# Patient Record
Sex: Male | Born: 1955 | Race: White | Hispanic: No
Health system: Southern US, Community
[De-identification: ages and names within clinical notes are randomized; demographics above are authoritative.]

## PROBLEM LIST (undated history)

## (undated) DIAGNOSIS — M109 Gout, unspecified: Secondary | ICD-10-CM

## (undated) DIAGNOSIS — N184 Chronic kidney disease, stage 4 (severe): Secondary | ICD-10-CM

## (undated) DIAGNOSIS — I10 Essential (primary) hypertension: Secondary | ICD-10-CM

## (undated) HISTORY — PX: CHOLECYSTECTOMY: SHX55

---

## 2004-04-30 ENCOUNTER — Emergency Department: Payer: Self-pay | Admitting: Emergency Medicine

## 2005-03-17 ENCOUNTER — Emergency Department: Payer: Self-pay | Admitting: Internal Medicine

## 2005-05-11 ENCOUNTER — Ambulatory Visit: Payer: Self-pay | Admitting: Nurse Practitioner

## 2005-08-10 ENCOUNTER — Ambulatory Visit: Payer: Self-pay | Admitting: Nurse Practitioner

## 2007-05-09 IMAGING — CR DG LUMBAR SPINE AP/LAT/OBLIQUES W/ FLEX AND EXT
1 series · 5 of 5 positions shown · non-contrast
Comparison: none

REASON FOR EXAM: Lumbar spine back pain
COMMENTS:

PROCEDURE:     DXR - DXR LUMBAR SPINE WITH OBLIQUES  - August 10, 2005  [DATE]
RESULT:          There is no evidence of a fracture, dislocation or
malalignment.  Findings consistent with early or mild degenerative disc
disease is appreciated within the lower lumbar spine.

[Series 1: view not recorded · 0.17mm/px · 5 of 5 slices shown]
[im 1/5]
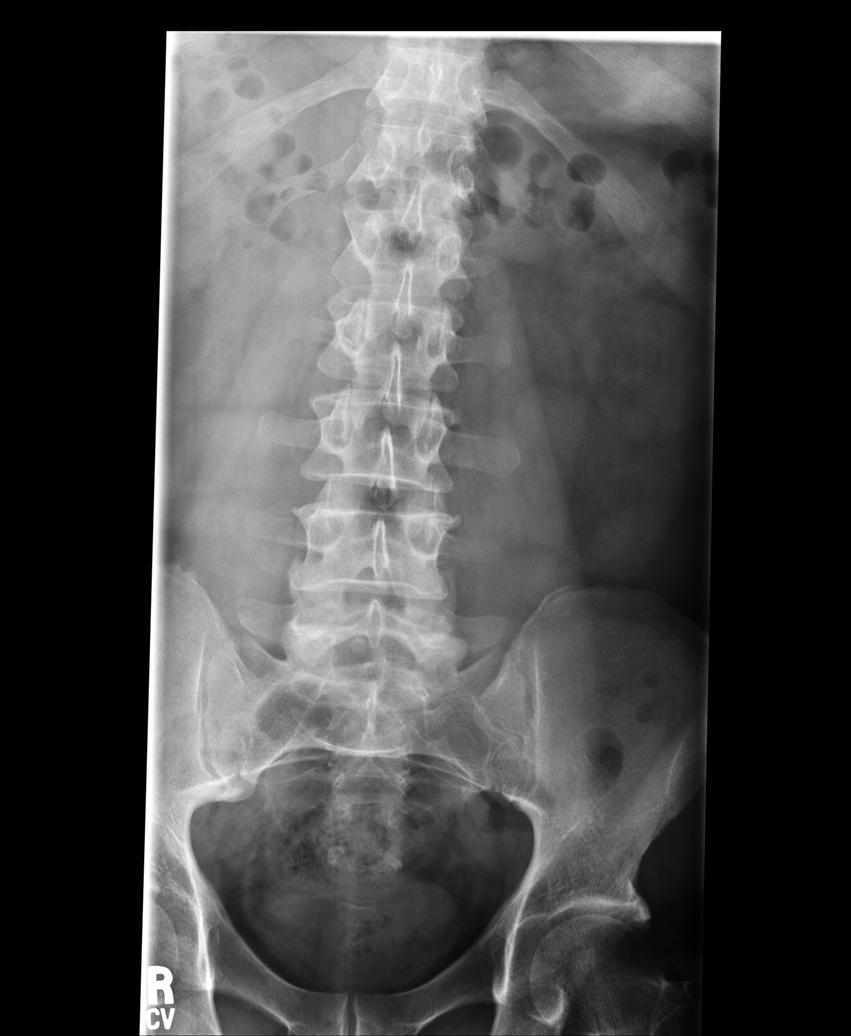
[im 2/5]
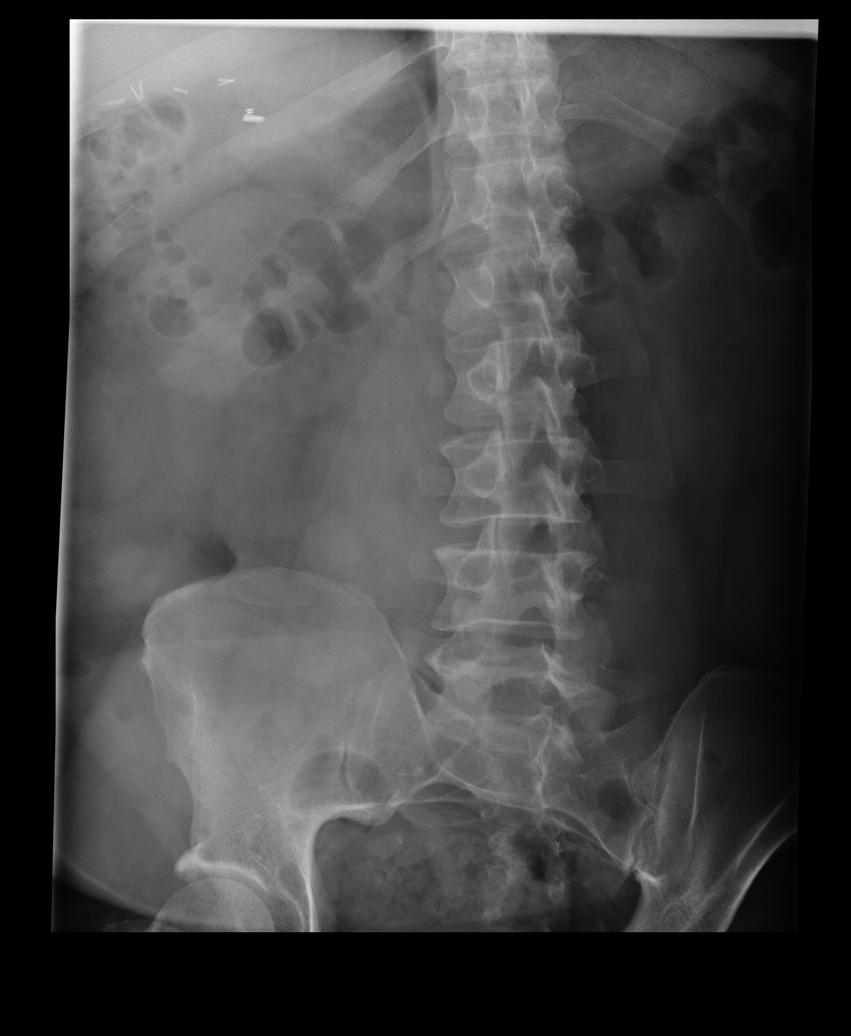
[im 3/5]
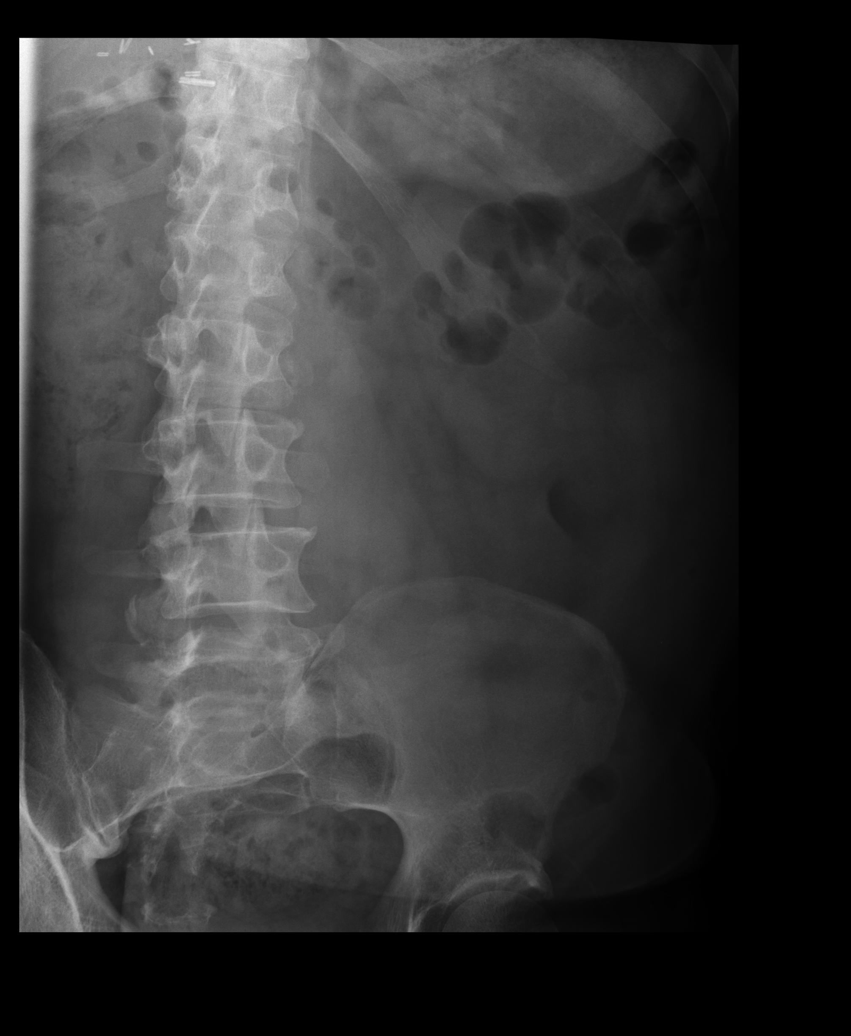
[im 4/5]
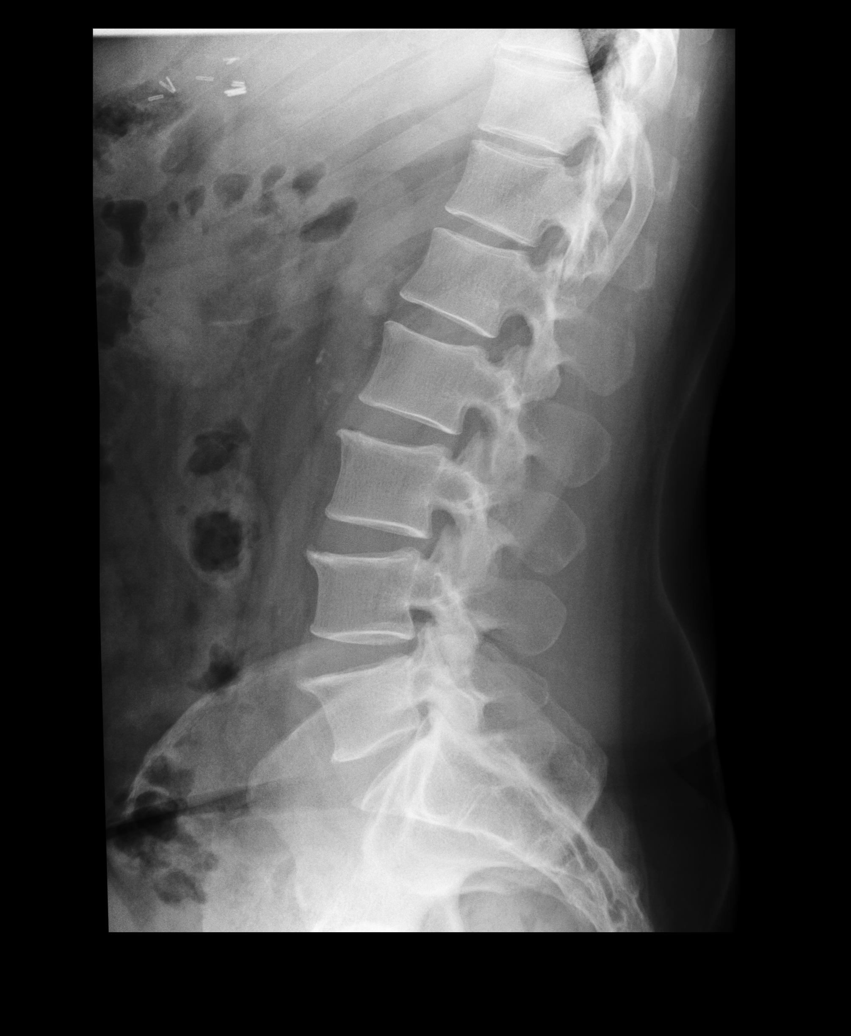
[im 5/5]
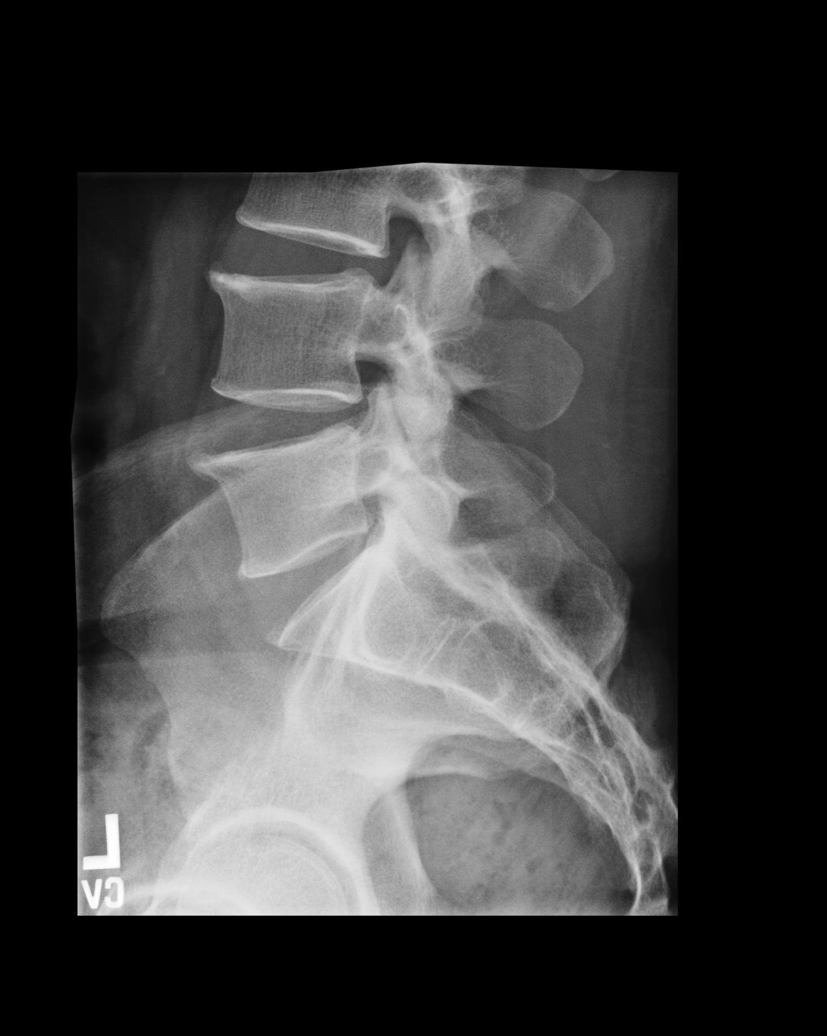

[5 of 5 positions shown; findings below may reference images not displayed]

IMPRESSION: 1.     No evidence of acute osseous abnormalities.
2.     Findings consistent with early or mild degenerative disc disease in
the lower lumbar spine. If there are persistent complaints of pain or
persistent clinical concern, further evaluation with lumbar MRI recommended
if clinically warranted.

## 2007-10-29 ENCOUNTER — Emergency Department: Payer: Self-pay | Admitting: Emergency Medicine

## 2008-08-09 ENCOUNTER — Ambulatory Visit: Payer: Self-pay | Admitting: Gastroenterology

## 2008-08-17 ENCOUNTER — Ambulatory Visit: Payer: Self-pay | Admitting: Nephrology

## 2009-04-15 ENCOUNTER — Encounter: Payer: Self-pay | Admitting: Family Medicine

## 2009-05-01 ENCOUNTER — Encounter: Payer: Self-pay | Admitting: Family Medicine

## 2015-07-11 ENCOUNTER — Inpatient Hospital Stay
Admission: EM | Admit: 2015-07-11 | Discharge: 2015-07-12 | DRG: 684 | Disposition: A | Discharge status: Home / Self Care | Payer: PRIVATE HEALTH INSURANCE | Attending: Internal Medicine | Admitting: Internal Medicine

## 2015-07-11 ENCOUNTER — Emergency Department: Payer: PRIVATE HEALTH INSURANCE

## 2015-07-11 DIAGNOSIS — Z8249 Family history of ischemic heart disease and other diseases of the circulatory system: Secondary | ICD-10-CM | POA: Diagnosis not present

## 2015-07-11 DIAGNOSIS — R55 Syncope and collapse: Secondary | ICD-10-CM | POA: Diagnosis present

## 2015-07-11 DIAGNOSIS — M109 Gout, unspecified: Secondary | ICD-10-CM | POA: Diagnosis present

## 2015-07-11 DIAGNOSIS — I959 Hypotension, unspecified: Secondary | ICD-10-CM | POA: Diagnosis present

## 2015-07-11 DIAGNOSIS — I10 Essential (primary) hypertension: Secondary | ICD-10-CM | POA: Diagnosis present

## 2015-07-11 DIAGNOSIS — E860 Dehydration: Secondary | ICD-10-CM | POA: Diagnosis present

## 2015-07-11 DIAGNOSIS — Z79899 Other long term (current) drug therapy: Secondary | ICD-10-CM

## 2015-07-11 DIAGNOSIS — N179 Acute kidney failure, unspecified: Secondary | ICD-10-CM | POA: Diagnosis present

## 2015-07-11 DIAGNOSIS — R7989 Other specified abnormal findings of blood chemistry: Secondary | ICD-10-CM | POA: Diagnosis present

## 2015-07-11 HISTORY — DX: Essential (primary) hypertension: I10

## 2015-07-11 LAB — CBC
HEMATOCRIT: 41.8 % (ref 40.0–52.0)
Hemoglobin: 13.7 g/dL (ref 13.0–18.0)
MCH: 28.4 pg (ref 26.0–34.0)
MCHC: 32.8 g/dL (ref 32.0–36.0)
MCV: 86.6 fL (ref 80.0–100.0)
Platelets: 361 10*3/uL (ref 150–440)
RBC: 4.83 MIL/uL (ref 4.40–5.90)
RDW: 15.2 % — ABNORMAL HIGH (ref 11.5–14.5)
WBC: 17.9 10*3/uL — ABNORMAL HIGH (ref 3.8–10.6)

## 2015-07-11 LAB — TROPONIN I
Troponin I: <0.03 ng/mL (ref <0.03)
Troponin I: <0.03 ng/mL (ref <0.03)

## 2015-07-11 LAB — COMPREHENSIVE METABOLIC PANEL
ALBUMIN: 4.4 g/dL (ref 3.5–5.0)
ALK PHOS: 69 U/L (ref 38–126)
ALT: 26 U/L (ref 17–63)
AST: 21 U/L (ref 15–41)
Anion gap: 11 (ref 5–15)
BILIRUBIN TOTAL: 1 mg/dL (ref 0.3–1.2)
BUN: 44 mg/dL — AB (ref 6–20)
CALCIUM: 9 mg/dL (ref 8.9–10.3)
CO2: 19 mmol/L — AB (ref 22–32)
CREATININE: 3.02 mg/dL — AB (ref 0.61–1.24)
Chloride: 106 mmol/L (ref 101–111)
GFR calc Af Amer: 24 mL/min — ABNORMAL LOW (ref >60)
GFR calc non Af Amer: 21 mL/min — ABNORMAL LOW (ref >60)
GLUCOSE: 137 mg/dL — AB (ref 65–99)
Potassium: 4.4 mmol/L (ref 3.5–5.1)
SODIUM: 136 mmol/L (ref 135–145)
TOTAL PROTEIN: 7.3 g/dL (ref 6.5–8.1)

## 2015-07-11 LAB — LIPID PANEL
Cholesterol: 184 mg/dL (ref 0–200)
HDL: 26 mg/dL — AB (ref >40)
LDL CALC: 126 mg/dL — AB (ref 0–99)
Total CHOL/HDL Ratio: 7.1 RATIO
Triglycerides: 161 mg/dL — ABNORMAL HIGH (ref <150)
VLDL: 32 mg/dL (ref 0–40)

## 2015-07-11 LAB — CREATININE, SERUM
Creatinine, Ser: 2.67 mg/dL — ABNORMAL HIGH (ref 0.61–1.24)
GFR calc Af Amer: 28 mL/min — ABNORMAL LOW (ref >60)
GFR, EST NON AFRICAN AMERICAN: 24 mL/min — AB (ref >60)

## 2015-07-11 MED ORDER — ACETAMINOPHEN 650 MG RE SUPP: 650.0000 mg | Freq: Four times a day (QID) | RECTAL | Status: DC | PRN

## 2015-07-11 MED ORDER — SODIUM CHLORIDE 0.9 % IV BOLUS (SEPSIS)
1000.0000 mL | Freq: Once | INTRAVENOUS | Status: AC
  Administered 2015-07-11: 1000 mL via INTRAVENOUS

## 2015-07-11 MED ORDER — MORPHINE SULFATE (PF) 2 MG/ML IV SOLN: 2.0000 mg | INTRAVENOUS | Status: DC | PRN

## 2015-07-11 MED ORDER — SODIUM CHLORIDE 0.9 % IV SOLN
INTRAVENOUS | Status: DC
  Administered 2015-07-11 – 2015-07-12 (×2): via INTRAVENOUS

## 2015-07-11 MED ORDER — SODIUM CHLORIDE 0.9% FLUSH: 3.0000 mL | Freq: Two times a day (BID) | INTRAVENOUS | Status: DC

## 2015-07-11 MED ORDER — ONDANSETRON HCL 4 MG PO TABS: 4.0000 mg | ORAL_TABLET | Freq: Four times a day (QID) | ORAL | Status: DC | PRN

## 2015-07-11 MED ORDER — OXYCODONE HCL 5 MG PO TABS
5.0000 mg | ORAL_TABLET | ORAL | Status: DC | PRN
Start: 2015-07-11 — End: 2015-07-12

## 2015-07-11 MED ORDER — ASPIRIN EC 81 MG PO TBEC
81.0000 mg | DELAYED_RELEASE_TABLET | Freq: Every day | ORAL | Status: DC
  Administered 2015-07-11 – 2015-07-12 (×2): 81 mg via ORAL
  Filled 2015-07-11 (×2): qty 1

## 2015-07-11 MED ORDER — ONDANSETRON HCL 4 MG/2ML IJ SOLN: 4.0000 mg | Freq: Four times a day (QID) | INTRAMUSCULAR | Status: DC | PRN

## 2015-07-11 MED ORDER — ATORVASTATIN CALCIUM 20 MG PO TABS
40.0000 mg | ORAL_TABLET | Freq: Every day | ORAL | Status: DC
Start: 2015-07-11 — End: 2015-07-12
  Administered 2015-07-11: 40 mg via ORAL
  Filled 2015-07-11: qty 2

## 2015-07-11 MED ORDER — ACETAMINOPHEN 325 MG PO TABS: 650.0000 mg | ORAL_TABLET | Freq: Four times a day (QID) | ORAL | Status: DC | PRN

## 2015-07-11 MED ORDER — HEPARIN SODIUM (PORCINE) 5000 UNIT/ML IJ SOLN
5000.0000 [IU] | Freq: Three times a day (TID) | INTRAMUSCULAR | Status: DC
  Administered 2015-07-11 – 2015-07-12 (×3): 5000 [IU] via SUBCUTANEOUS
  Filled 2015-07-11 (×3): qty 1

## 2015-07-11 NOTE — Progress Notes (Signed)
Patient admitted fro ED with acute renal failure,hydration in progress,syncopal episode .Denies dizziness on admission,vital signs within normal limits,normal sinus rhythm on tele.

## 2015-07-11 NOTE — ED Provider Notes (Signed)
[facility] Emergency Department Provider Note   ____________________________________________    I have reviewed the triage vital signs and the nursing notes.   HISTORY  Chief Complaint Loss of Consciousness     HPI Michael Ramsey is a 60 y.o. male who presents after a reported single episode. Patient reports he had chest pressure which started this morning just prior to feeling dizzy, sweaty. He had to sit down and EMS reports that he apparently syncopized in front of them. Initial BP after syncope was 80/40. EMS gave 500 cc bolus and the patient reports he feels well. No chest pain. This is never happened before. No history of heart disease.   Past Medical History  Diagnosis Date  . Hypertension     There are no active problems to display for this patient.   History reviewed. No pertinent past surgical history.  No current outpatient prescriptions on file.  Allergies Review of patient's allergies indicates no known allergies.  No family history on file.  Social History Social History  Substance Use Topics  . Smoking status: Never Smoker   . Smokeless tobacco: None  . Alcohol Use: No    Review of Systems  Constitutional: No fever/chills Eyes: No visual changes.  ENT: No sore throat. Cardiovascular: As above Respiratory: Denies shortness of breath. Gastrointestinal: No abdominal pain.  No nausea, no vomiting.   Genitourinary: Negative for dysuria. Musculoskeletal: Negative for back pain. Skin: Negative for rash. Neurological: Negative for headaches or focal weakness   10-point ROS otherwise negative.  ____________________________________________   PHYSICAL EXAM:  VITAL SIGNS: ED Triage Vitals  Enc Vitals Group     BP 07/11/15 1307 103/60 mmHg     Pulse --      Resp 07/11/15 1310 23     Temp 07/11/15 1308 97.5 F (36.4 C)     Temp src --      SpO2 --      Weight 07/11/15 1308 198 lb (89.812 kg)     Height  07/11/15 1308 5\' 4"  (1.626 m)     Head Cir --      Peak Flow --      Pain Score --      Pain Loc --      Pain Edu? --      Excl. in [place]? --     Constitutional: Alert and oriented. No acute distress.  Eyes: Conjunctivae are normal.  Head: Normocephalic Nose: No congestion/rhinnorhea. Mouth/Throat: Mucous membranes are moist.  Oropharynx non-erythematous. Neck:  Painless ROM Cardiovascular: Normal rate, regular rhythm. Grossly normal heart sounds.  Good peripheral circulation. Respiratory: Normal respiratory effort.  No retractions. Lungs CTAB. Gastrointestinal: Soft and nontender. No distention.  No CVA tenderness. Genitourinary: deferred Musculoskeletal: No lower extremity tenderness nor edema.  Warm and well perfused Neurologic:  Normal speech and language. No gross focal neurologic deficits are appreciated.  Skin:  Skin is warm, dry and intact. No rash noted. Psychiatric: Mood and affect are normal. Speech and behavior are normal.  ____________________________________________   LABS (all labs ordered are listed, but only abnormal results are displayed)  Labs Reviewed  CBC - Abnormal; Notable for the following:    WBC 17.9 (*)    RDW 15.2 (*)    All other components within normal limits  COMPREHENSIVE METABOLIC PANEL - Abnormal; Notable for the following:    CO2 19 (*)    Glucose, Bld 137 (*)    BUN 44 (*)    Creatinine,  Ser 3.02 (*)    GFR calc non Af Amer 21 (*)    GFR calc Af Amer 24 (*)    All other components within normal limits  TROPONIN I   ____________________________________________  EKG  ED ECG REPORT I, Lavonia Drafts, the attending physician, personally viewed and interpreted this ECG.  Date: 07/11/2015 EKG Time: 1:11 PM Rate: 5 Rhythm: normal sinus rhythm QRS Axis: normal Intervals: normal ST/T Wave abnormalities: normal Conduction Disturbances: Prolonged QT Narrative Interpretation:  unremarkable  ____________________________________________  RADIOLOGY  Chest x-ray pending ____________________________________________   PROCEDURES  Procedure(s) performed: No    Critical Care performed: No ____________________________________________   INITIAL IMPRESSION / ASSESSMENT AND PLAN / ED COURSE  Pertinent labs & imaging results that were available during my care of the patient were reviewed by me and considered in my medical decision making (see chart for details).  She presents after syncopal episode, apparently he had chest pressure prior to the event. He feels well now. We'll check labs, chest x-ray given IV fluids. EKG is reassuring.  ----------------------------------------- 1:56 PM on 07/11/2015 -----------------------------------------  Patient with creatinine over 3, no old records to compare to. He will require admission for further evaluation.  ____________________________________________   FINAL CLINICAL IMPRESSION(S) / ED DIAGNOSES  Final diagnoses:  Syncope and collapse  Acute renal failure, unspecified acute renal failure type ([place])      NEW MEDICATIONS STARTED DURING THIS VISIT:  New Prescriptions   No medications on file     Note:  This document was prepared using Dragon voice recognition software and may include unintentional dictation errors.    Lavonia Drafts, MD 07/11/15 [phone]

## 2015-07-11 NOTE — ED Notes (Signed)
PT BIB EMS, was at store when he became dizzy, diaphoretic and had chest pressure, reports he sat down and then a witnessed syncopal episode, initial BP 78/40. EMS gave 500 mL bolus

## 2015-07-11 NOTE — H&P (Signed)
[place] [facility] [place] NAME: Michael Ramsey    MR#:  [id number]  DATE OF BIRTH:  30-Jan-1955   DATE OF ADMISSION:  07/11/2015  PRIMARY CARE PHYSICIAN: Marguerita Merles, MD   REQUESTING/REFERRING PHYSICIAN: [place]  CHIEF COMPLAINT:   Chief Complaint  Patient presents with  . Loss of Consciousness    HISTORY OF PRESENT ILLNESS:  Michael Ramsey  is a 60 y.o. male with a known history of Essential hypertension who is presenting after an episode of syncope and chest pain. He describes acute onset of chest pain described as pressure nonradiating intensity 6/10 associated shortness of breath diaphoresis was followed by subsequent syncopal episode. To Hospital further workup and evaluation. He states he was in his usual state of health prior to this no fevers chills or further episodes. He was found to have elevated creatinine  PAST MEDICAL HISTORY:   Past Medical History  Diagnosis Date  . Hypertension     PAST SURGICAL HISTORY:  History reviewed. No pertinent past surgical history.  SOCIAL HISTORY:   Social History  Substance Use Topics  . Smoking status: Never Smoker   . Smokeless tobacco: Not on file  . Alcohol Use: No    FAMILY HISTORY:   Family History  Problem Relation Age of Onset  . Hypertension Other     DRUG ALLERGIES:  No Known Allergies  REVIEW OF SYSTEMS:  REVIEW OF SYSTEMS:  CONSTITUTIONAL: Denies fevers, chills, fatigue, weakness.  EYES: Denies blurred vision, double vision, or eye pain.  EARS, NOSE, THROAT: Denies tinnitus, ear pain, hearing loss.  RESPIRATORY: denies cough, shortness of breath, wheezing  CARDIOVASCULAR: Positive chest pain, denies palpitations, edema.  GASTROINTESTINAL: Denies nausea, vomiting, diarrhea, abdominal pain.  GENITOURINARY: Denies dysuria, hematuria.  ENDOCRINE: Denies nocturia or thyroid problems. HEMATOLOGIC AND LYMPHATIC: Denies easy bruising or bleeding.  SKIN: Denies rash or  lesions.  MUSCULOSKELETAL: Denies pain in neck, back, shoulder, knees, hips, or further arthritic symptoms.  NEUROLOGIC: Denies paralysis, paresthesias.  PSYCHIATRIC: Denies anxiety or depressive symptoms. Otherwise full review of systems performed by me is negative.   MEDICATIONS AT HOME:   Prior to Admission medications   Medication Sig Start Date End Date Taking? Authorizing Provider  amLODipine (NORVASC) 5 MG tablet Take 5 mg by mouth daily.   Yes Historical Provider, MD  febuxostat (ULORIC) 40 MG tablet Take 40 mg by mouth daily.   Yes Historical Provider, MD  lisinopril (PRINIVIL,ZESTRIL) 40 MG tablet Take 40 mg by mouth daily.   Yes Historical Provider, MD      VITAL SIGNS:  Blood pressure 109/63, pulse 70, temperature 97.5 F (36.4 C), resp. rate 18, height 5\' 4"  (1.626 m), weight 198 lb (89.812 kg), SpO2 96 %.  PHYSICAL EXAMINATION:  VITAL SIGNS: Filed Vitals:   07/11/15 1310 07/11/15 1400  BP:  109/63  Pulse:  70  Temp:    Resp: 23 18   GENERAL:60 y.o.male currently in no acute distress.  HEAD: Normocephalic, atraumatic.  EYES: Pupils equal, round, reactive to light. Extraocular muscles intact. No scleral icterus.  MOUTH: Moist mucosal membrane. Dentition intact. No abscess noted.  EAR, NOSE, THROAT: Clear without exudates. No external lesions.  NECK: Supple. No thyromegaly. No nodules. No JVD.  PULMONARY: Clear to ascultation, without wheeze rails or rhonci. No use of accessory muscles, Good respiratory effort. good air entry bilaterally CHEST: Nontender to palpation.  CARDIOVASCULAR: S1 and S2. Regular rate and rhythm. No murmurs, rubs, or gallops. No  edema. Pedal pulses 2+ bilaterally.  GASTROINTESTINAL: Soft, nontender, nondistended. No masses. Positive bowel sounds. No hepatosplenomegaly.  MUSCULOSKELETAL: No swelling, clubbing, or edema. Range of motion full in all extremities.  NEUROLOGIC: Cranial nerves II through XII are intact. No gross focal neurological  deficits. Sensation intact. Reflexes intact.  SKIN: No ulceration, lesions, rashes, or cyanosis. Skin warm and dry. Turgor intact.  PSYCHIATRIC: Mood, affect within normal limits. The patient is awake, alert and oriented x 3. Insight, judgment intact.    LABORATORY PANEL:   CBC  Recent Labs Lab 07/11/15 1312  WBC 17.9*  HGB 13.7  HCT 41.8  PLT 361   ------------------------------------------------------------------------------------------------------------------  Chemistries   Recent Labs Lab 07/11/15 1312  NA 136  K 4.4  CL 106  CO2 19*  GLUCOSE 137*  BUN 44*  CREATININE 3.02*  CALCIUM 9.0  AST 21  ALT 26  ALKPHOS 69  BILITOT 1.0   ------------------------------------------------------------------------------------------------------------------  Cardiac Enzymes  Recent Labs Lab 07/11/15 1312  TROPONINI <0.03   ------------------------------------------------------------------------------------------------------------------  RADIOLOGY:  Dg Chest Portable 1 View  07/11/2015  CLINICAL DATA:  Syncopal episode following dizziness and chest pressure today. EXAM: PORTABLE CHEST 1 VIEW COMPARISON:  10/29/2007. FINDINGS: The heart size and mediastinal contours are within normal limits. Both lungs are clear. The visualized skeletal structures are unremarkable. IMPRESSION: Normal examination. Electronically Signed   By: Claudie Revering M.D.   On: 07/11/2015 14:02    EKG:   Orders placed or performed during the hospital encounter of 07/11/15  . EKG 12-Lead  . EKG 12-Lead  . ED EKG  . ED EKG    IMPRESSION AND PLAN:   60 year old Caucasian gentleman history of essential hypertension and gout who is presenting after syncopal episode and chest pain  1. Syncope/chest pain:Initiate aspirin and statin therapy, admitted to telemetry, trend cardiac enzymes 3,  if continued elevation will initiate heparin drip ,nitroglycerin when necessary, morphine when necessary 2. Acute  renal failure: Unknown baseline IV fluid hydration and follow urine output renal function avoid further nephrotoxic agents 3. Essential hypertension: Patient actually hypotensive on arrival hold agents      All the records are reviewed and case discussed with ED provider. Management plans discussed with the patient, family and they are in agreement.  CODE STATUS: Full  TOTAL TIME TAKING CARE OF THIS PATIENT: 33 minutes.    Hower,  Karenann Cai.D on 07/11/2015 at 3:45 PM  Between 7am to 6pm - Pager - [phone]  After 6pm: House Pager: - [phone]  [facility]  Office  [phone]  CC: Primary care physician; Marguerita Merles, MD

## 2015-07-12 LAB — CBC
HCT: 37.9 % — ABNORMAL LOW (ref 40.0–52.0)
HEMOGLOBIN: 13.1 g/dL (ref 13.0–18.0)
MCH: 29.6 pg (ref 26.0–34.0)
MCHC: 34.4 g/dL (ref 32.0–36.0)
MCV: 85.8 fL (ref 80.0–100.0)
PLATELETS: 292 10*3/uL (ref 150–440)
RBC: 4.42 MIL/uL (ref 4.40–5.90)
RDW: 15.4 % — ABNORMAL HIGH (ref 11.5–14.5)
WBC: 11.7 10*3/uL — AB (ref 3.8–10.6)

## 2015-07-12 LAB — BASIC METABOLIC PANEL
ANION GAP: 6 (ref 5–15)
BUN: 43 mg/dL — ABNORMAL HIGH (ref 6–20)
CHLORIDE: 111 mmol/L (ref 101–111)
CO2: 21 mmol/L — AB (ref 22–32)
Calcium: 8.5 mg/dL — ABNORMAL LOW (ref 8.9–10.3)
Creatinine, Ser: 1.78 mg/dL — ABNORMAL HIGH (ref 0.61–1.24)
GFR calc non Af Amer: 39 mL/min — ABNORMAL LOW (ref >60)
GFR, EST AFRICAN AMERICAN: 46 mL/min — AB (ref >60)
Glucose, Bld: 106 mg/dL — ABNORMAL HIGH (ref 65–99)
POTASSIUM: 4.3 mmol/L (ref 3.5–5.1)
SODIUM: 138 mmol/L (ref 135–145)

## 2015-07-12 LAB — URINALYSIS COMPLETE WITH MICROSCOPIC (ARMC ONLY)
Bilirubin Urine: NEGATIVE
Glucose, UA: NEGATIVE mg/dL
KETONES UR: NEGATIVE mg/dL
LEUKOCYTES UA: NEGATIVE
Nitrite: NEGATIVE
PH: 6 (ref 5.0–8.0)
PROTEIN: NEGATIVE mg/dL
SPECIFIC GRAVITY, URINE: 1.006 (ref 1.005–1.030)
SQUAMOUS EPITHELIAL / LPF: NONE SEEN

## 2015-07-12 LAB — TROPONIN I: Troponin I: <0.03 ng/mL (ref <0.03)

## 2015-07-12 MED ORDER — LISINOPRIL 40 MG PO TABS: 40.0000 mg | ORAL_TABLET | Freq: Every day | ORAL | Status: DC

## 2015-07-12 NOTE — Progress Notes (Signed)
[place], [place].   07/12/2015  Patient: Michael Ramsey   Date of Birth:  02-01-1955  Date of admission:  07/11/2015  Date of Discharge  07/12/2015    To Whom it May Concern:    Dayne Rayas  may return to work on 07/14/15.  PHYSICAL ACTIVITY:  Full  If you have any questions or concerns, please don't hesitate to call.  Sincerely,   Vaughan Basta M.D Pager [phone] Office : [phone]   .

## 2015-07-12 NOTE — Progress Notes (Signed)
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 Brother is in need to go back to [place] for work. Dr. Anselm Jungling notified to discuss plan of care for patient. Stated that he will come and see patient around 10 or 11 to see if patient is ready for discharged. Brother Ron Notified and stated that was fine and he would check back in around then.

## 2015-07-12 NOTE — Discharge Instructions (Signed)
Acute Kidney Injury Acute kidney injury is any condition in which there is sudden (acute) damage to the kidneys. Acute kidney injury was previously known as acute kidney failure or acute renal failure. The kidneys are two organs that lie on either side of the spine between the middle of the back and the front of the abdomen. The kidneys:  Remove wastes and extra water from the blood.   Produce important hormones. These help keep bones strong, regulate blood pressure, and help create red blood cells.   Balance the fluids and chemicals in the blood and tissues. A small amount of kidney damage may not cause problems, but a large amount of damage may make it difficult or impossible for the kidneys to work the way they should. Acute kidney injury may develop into long-lasting (chronic) kidney disease. It may also develop into a life-threatening disease called end-stage kidney disease. Acute kidney injury can get worse very quickly, so it should be treated right away. Early treatment may prevent other kidney diseases from developing. CAUSES   A problem with blood flow to the kidneys. This may be caused by:   Blood loss.   Heart disease.   Severe burns.   Liver disease.  Direct damage to the kidneys. This may be caused by:  Some medicines.   A kidney infection.   Poisoning or consuming toxic substances.   A surgical wound.   A blow to the kidney area.   A problem with urine flow. This may be caused by:   Cancer.   Kidney stones.   An enlarged prostate. SIGNS AND SYMPTOMS   Swelling (edema) of the legs, ankles, or feet.   Tiredness (lethargy).   Nausea or vomiting.   Confusion.   Problems with urination, such as:   Painful or burning feeling during urination.   Decreased urine production.   Frequent accidents in children who are potty trained.   Bloody urine.   Muscle twitches and cramps.   Shortness of breath.   Seizures.   Chest  pain or pressure. Sometimes, no symptoms are present. DIAGNOSIS Acute kidney injury may be detected and diagnosed by tests, including blood, urine, imaging, or kidney biopsy tests.  TREATMENT Treatment of acute kidney injury varies depending on the cause and severity of the kidney damage. In mild cases, no treatment may be needed. The kidneys may heal on their own. If acute kidney injury is more severe, your health care provider will treat the cause of the kidney damage, help the kidneys heal, and prevent complications from occurring. Severe cases may require a procedure to remove toxic wastes from the body (dialysis) or surgery to repair kidney damage. Surgery may involve:   Repair of a torn kidney.   Removal of an obstruction. HOME CARE INSTRUCTIONS  Follow your prescribed diet.  Take medicines only as directed by your health care provider.  Do not take any new medicines (prescription, over-the-counter, or nutritional supplements) unless approved by your health care provider. Many medicines can worsen your kidney damage or may need to have the dose adjusted.   Keep all follow-up visits as directed by your health care provider. This is important.  Observe your condition to make sure you are healing as expected. SEEK IMMEDIATE MEDICAL CARE IF:  You are feeling ill or have severe pain in the back or side.   Your symptoms return or you have new symptoms.  You have any symptoms of end-stage kidney disease. These include:   Persistent itchiness.  Loss of appetite.   Headaches.   Abnormally dark or light skin.  Numbness in the hands or feet.   Easy bruising.   Frequent hiccups.   Menstruation stops.   You have a fever.  You have increased urine production.  You have pain or bleeding when urinating. MAKE SURE YOU:   Understand these instructions.  Will watch your condition.  Will get help right away if you are not doing well or get worse.   This  information is not intended to replace advice given to you by your health care provider. Make sure you discuss any questions you have with your health care provider.   Document Released: 07/03/2010 Document Revised: 01/08/2014 Document Reviewed: 08/17/2011 Elsevier Interactive Patient Education 2016 [employer].  Near-Syncope Near-syncope (commonly known as near fainting) is sudden weakness, dizziness, or feeling like you might pass out. This can happen when getting up or while standing for a long time. It is caused by a sudden decrease in blood flow to the brain, which can occur for various reasons. Most of the reasons are not serious.  HOME CARE Watch your condition for any changes.  Have someone stay with you until you feel stable.  If you feel like you are going to pass out:  Lie down right away.  Prop your feet up if you can.  Breathe deeply and steadily.  Move only when the feeling has gone away. Most of the time, this feeling lasts only a few minutes. You may feel tired for several hours.  Drink enough fluids to keep your pee (urine) clear or pale yellow.  If you are taking blood pressure or heart medicine, stand up slowly.  Follow up with your doctor as told. GET HELP RIGHT AWAY IF:   You have a severe headache.  You have unusual pain in the chest, belly (abdomen), or back.  You have bleeding from the mouth or butt (rectum), or you have black or tarry poop (stool).  You feel your heart beat differently than normal, or you have a very fast pulse.  You pass out, or you twitch and shake when you pass out.  You pass out when sitting or lying down.  You feel confused.  You have trouble walking.  You are weak.  You have vision problems. MAKE SURE YOU:   Understand these instructions.  Will watch your condition.  Will get help right away if you are not doing well or get worse.   This information is not intended to replace advice given to you by your health  care provider. Make sure you discuss any questions you have with your health care provider.   Document Released: 06/06/2007 Document Revised: 01/08/2014 Document Reviewed: 05/23/2012 Elsevier Interactive Patient Education [employer].

## 2015-07-12 NOTE — Progress Notes (Signed)
Patient discharged via wheelchair and private vehicle. IV removed and catheter intact. All discharge instructions given and patient verbalizes understanding. Work note given. Tele removed and returned. No prescriptions given to patient No distress noted.

## 2015-07-28 NOTE — Discharge Summary (Signed)
[facility] [place] NAME: Michael Ramsey    MR#:  [id number]  DATE OF BIRTH:  [birth year]  DATE OF ADMISSION:  07/11/2015 ADMITTING PHYSICIAN: Lytle Butte, MD  DATE OF DISCHARGE: 07/12/2015 12:20 PM  PRIMARY CARE PHYSICIAN: Marguerita Merles, MD    ADMISSION DIAGNOSIS:  Syncope and collapse [R55] Acute renal failure, unspecified acute renal failure type ([place]) [N17.9]  DISCHARGE DIAGNOSIS:  Active Problems:   Acute renal failure (ARF) ([place])   Syncope   SECONDARY DIAGNOSIS:   Past Medical History:  Diagnosis Date  . Hypertension     HOSPITAL COURSE:   Came with syncopal episode, renal failure and dehydration.   Monitored on tele, serial troponin negative.   His renal function improved with IV fluids,a dn he felt better.  DISCHARGE CONDITIONS:   Stable.  CONSULTS OBTAINED:  Treatment Team:  Lytle Butte, MD  DRUG ALLERGIES:  No Known Allergies  DISCHARGE MEDICATIONS:   Discharge Medication List as of 07/12/2015 12:07 PM    CONTINUE these medications which have CHANGED   Details  lisinopril (PRINIVIL,ZESTRIL) 40 MG tablet Take 1 tablet (40 mg total) by mouth daily., Starting 07/14/2015, Until Discontinued, Normal      CONTINUE these medications which have NOT CHANGED   Details  amLODipine (NORVASC) 5 MG tablet Take 5 mg by mouth daily., Until Discontinued, Historical Med    febuxostat (ULORIC) 40 MG tablet Take 40 mg by mouth daily., Until Discontinued, Historical Med         DISCHARGE INSTRUCTIONS:    Follow with PMD in 1-2 weeks.  If you experience worsening of your admission symptoms, develop shortness of breath, life threatening emergency, suicidal or homicidal thoughts you must seek medical attention immediately by calling 911 or calling your MD immediately  if symptoms less severe.  You Must read complete instructions/literature along with all the possible adverse reactions/side effects for all the  Medicines you take and that have been prescribed to you. Take any new Medicines after you have completely understood and accept all the possible adverse reactions/side effects.   Please note  You were cared for by a hospitalist during your hospital stay. If you have any questions about your discharge medications or the care you received while you were in the hospital after you are discharged, you can call the unit and asked to speak with the hospitalist on call if the hospitalist that took care of you is not available. Once you are discharged, your primary care physician will handle any further medical issues. Please note that NO REFILLS for any discharge medications will be authorized once you are discharged, as it is imperative that you return to your primary care physician (or establish a relationship with a primary care physician if you do not have one) for your aftercare needs so that they can reassess your need for medications and monitor your lab values.    Today   CHIEF COMPLAINT:   Chief Complaint  Patient presents with  . Loss of Consciousness    HISTORY OF PRESENT ILLNESS:  Michael Ramsey  is a 60 y.o. male with a known history of Essential hypertension who is presenting after an episode of syncope and chest pain. He describes acute onset of chest pain described as pressure nonradiating intensity 6/10 associated shortness of breath diaphoresis was followed by subsequent syncopal episode. To Hospital further workup and evaluation. He states he was in his usual state of health prior to this  no fevers chills or further episodes. He was found to have elevated creatinine   VITAL SIGNS:  Blood pressure 111/61, pulse (!) 58, temperature 98.1 F (36.7 C), temperature source Oral, resp. rate 18, height 5\' 4"  (1.626 m), weight 89.8 kg (198 lb), SpO2 98 %.  I/O:  No intake or output data in the 24 hours ending 07/28/15 1858  PHYSICAL EXAMINATION:  GENERAL:  60 y.o.-year-old patient lying  in the bed with no acute distress.  EYES: Pupils equal, round, reactive to light and accommodation. No scleral icterus. Extraocular muscles intact.  HEENT: Head atraumatic, normocephalic. Oropharynx and nasopharynx clear.  NECK:  Supple, no jugular venous distention. No thyroid enlargement, no tenderness.  LUNGS: Normal breath sounds bilaterally, no wheezing, rales,rhonchi or crepitation. No use of accessory muscles of respiration.  CARDIOVASCULAR: S1, S2 normal. No murmurs, rubs, or gallops.  ABDOMEN: Soft, non-tender, non-distended. Bowel sounds present. No organomegaly or mass.  EXTREMITIES: No pedal edema, cyanosis, or clubbing.  NEUROLOGIC: Cranial nerves II through XII are intact. Muscle strength 5/5 in all extremities. Sensation intact. Gait not checked.  PSYCHIATRIC: The patient is alert and oriented x 3.  SKIN: No obvious rash, lesion, or ulcer.   DATA REVIEW:   CBC No results for input(s): WBC, HGB, HCT, PLT in the last 168 hours.  Chemistries  No results for input(s): NA, K, CL, CO2, GLUCOSE, BUN, CREATININE, CALCIUM, MG, AST, ALT, ALKPHOS, BILITOT in the last 168 hours.  Invalid input(s): GFRCGP  Cardiac Enzymes No results for input(s): TROPONINI in the last 168 hours.  Microbiology Results  No results found for this or any previous visit.  RADIOLOGY:  No results found.  EKG:   Orders placed or performed during the hospital encounter of 07/11/15  . EKG 12-Lead  . EKG 12-Lead  . ED EKG  . ED EKG  . EKG      Management plans discussed with the patient, family and they are in agreement.  CODE STATUS:  Code Status History    Date Active Date Inactive Code Status Order ID Comments User Context   07/11/2015  3:36 PM 07/12/2015  4:18 PM Full Code [id number]  Lytle Butte, MD ED      TOTAL TIME TAKING CARE OF THIS PATIENT: 35 minutes.    Vaughan Basta M.D on 07/28/2015 at 6:58 PM  Between 7am to 6pm - Pager - [phone]  After 6pm go to  [email or web address] - password [email or web address] [facility]  Office  [phone]  CC: Primary care physician; Marguerita Merles, MD   Note: This dictation was prepared with Dragon dictation along with smaller phrase technology. Any transcriptional errors that result from this process are unintentional.

## 2019-05-11 ENCOUNTER — Other Ambulatory Visit: Payer: Self-pay

## 2019-05-11 ENCOUNTER — Encounter: Payer: Self-pay | Admitting: Internal Medicine

## 2019-05-11 ENCOUNTER — Inpatient Hospital Stay
Admission: EM | Admit: 2019-05-11 | Discharge: 2019-05-20 | DRG: 554 | Disposition: A | Discharge status: Skilled Nursing Facility | Payer: Medicare Other | Attending: Internal Medicine | Admitting: Internal Medicine

## 2019-05-11 DIAGNOSIS — M10022 Idiopathic gout, left elbow: Secondary | ICD-10-CM | POA: Diagnosis present

## 2019-05-11 DIAGNOSIS — M25532 Pain in left wrist: Secondary | ICD-10-CM | POA: Diagnosis present

## 2019-05-11 DIAGNOSIS — N184 Chronic kidney disease, stage 4 (severe): Secondary | ICD-10-CM

## 2019-05-11 DIAGNOSIS — M109 Gout, unspecified: Secondary | ICD-10-CM | POA: Diagnosis present

## 2019-05-11 DIAGNOSIS — D75839 Thrombocytosis, unspecified: Secondary | ICD-10-CM | POA: Diagnosis present

## 2019-05-11 DIAGNOSIS — R262 Difficulty in walking, not elsewhere classified: Secondary | ICD-10-CM

## 2019-05-11 DIAGNOSIS — M10031 Idiopathic gout, right wrist: Secondary | ICD-10-CM | POA: Diagnosis present

## 2019-05-11 DIAGNOSIS — N179 Acute kidney failure, unspecified: Secondary | ICD-10-CM | POA: Diagnosis present

## 2019-05-11 DIAGNOSIS — R509 Fever, unspecified: Secondary | ICD-10-CM

## 2019-05-11 DIAGNOSIS — I10 Essential (primary) hypertension: Secondary | ICD-10-CM

## 2019-05-11 DIAGNOSIS — M10032 Idiopathic gout, left wrist: Secondary | ICD-10-CM | POA: Diagnosis present

## 2019-05-11 DIAGNOSIS — M25432 Effusion, left wrist: Secondary | ICD-10-CM

## 2019-05-11 DIAGNOSIS — M7989 Other specified soft tissue disorders: Secondary | ICD-10-CM

## 2019-05-11 DIAGNOSIS — M10071 Idiopathic gout, right ankle and foot: Secondary | ICD-10-CM | POA: Diagnosis present

## 2019-05-11 DIAGNOSIS — I129 Hypertensive chronic kidney disease with stage 1 through stage 4 chronic kidney disease, or unspecified chronic kidney disease: Secondary | ICD-10-CM | POA: Diagnosis present

## 2019-05-11 DIAGNOSIS — D72829 Elevated white blood cell count, unspecified: Secondary | ICD-10-CM

## 2019-05-11 DIAGNOSIS — Z20822 Contact with and (suspected) exposure to covid-19: Secondary | ICD-10-CM | POA: Diagnosis present

## 2019-05-11 DIAGNOSIS — M79645 Pain in left finger(s): Secondary | ICD-10-CM

## 2019-05-11 DIAGNOSIS — M10072 Idiopathic gout, left ankle and foot: Secondary | ICD-10-CM | POA: Diagnosis not present

## 2019-05-11 DIAGNOSIS — L539 Erythematous condition, unspecified: Secondary | ICD-10-CM

## 2019-05-11 DIAGNOSIS — Z79899 Other long term (current) drug therapy: Secondary | ICD-10-CM

## 2019-05-11 DIAGNOSIS — M25579 Pain in unspecified ankle and joints of unspecified foot: Secondary | ICD-10-CM | POA: Diagnosis present

## 2019-05-11 HISTORY — DX: Gout, unspecified: M10.9

## 2019-05-11 HISTORY — DX: Chronic kidney disease, stage 4 (severe): N18.4

## 2019-05-11 LAB — CBC WITH DIFFERENTIAL/PLATELET
Abs Immature Granulocytes: 0.11 10*3/uL — ABNORMAL HIGH (ref 0.00–0.07)
Basophils Absolute: 0 10*3/uL (ref 0.0–0.1)
Basophils Relative: 0 %
Eosinophils Absolute: 0 10*3/uL (ref 0.0–0.5)
Eosinophils Relative: 0 %
HCT: 41.9 % (ref 39.0–52.0)
Hemoglobin: 13.4 g/dL (ref 13.0–17.0)
Immature Granulocytes: 1 %
Lymphocytes Relative: 9 %
Lymphs Abs: 1.6 10*3/uL (ref 0.7–4.0)
MCH: 28 pg (ref 26.0–34.0)
MCHC: 32 g/dL (ref 30.0–36.0)
MCV: 87.5 fL (ref 80.0–100.0)
Monocytes Absolute: 1 10*3/uL (ref 0.1–1.0)
Monocytes Relative: 6 %
Neutro Abs: 16.2 10*3/uL — ABNORMAL HIGH (ref 1.7–7.7)
Neutrophils Relative %: 84 %
Platelets: 575 10*3/uL — ABNORMAL HIGH (ref 150–400)
RBC: 4.79 MIL/uL (ref 4.22–5.81)
RDW: 14.3 % (ref 11.5–15.5)
WBC: 19 10*3/uL — ABNORMAL HIGH (ref 4.0–10.5)
nRBC: 0 % (ref 0.0–0.2)

## 2019-05-11 LAB — COMPREHENSIVE METABOLIC PANEL
ALT: 41 U/L (ref 0–44)
AST: 31 U/L (ref 15–41)
Albumin: 3.5 g/dL (ref 3.5–5.0)
Alkaline Phosphatase: 120 U/L (ref 38–126)
Anion gap: 14 (ref 5–15)
BUN: 46 mg/dL — ABNORMAL HIGH (ref 8–23)
CO2: 19 mmol/L — ABNORMAL LOW (ref 22–32)
Calcium: 9.4 mg/dL (ref 8.9–10.3)
Chloride: 103 mmol/L (ref 98–111)
Creatinine, Ser: 2.06 mg/dL — ABNORMAL HIGH (ref 0.61–1.24)
GFR calc Af Amer: 38 mL/min — ABNORMAL LOW (ref >60)
GFR calc non Af Amer: 33 mL/min — ABNORMAL LOW (ref >60)
Glucose, Bld: 130 mg/dL — ABNORMAL HIGH (ref 70–99)
Potassium: 4.2 mmol/L (ref 3.5–5.1)
Sodium: 136 mmol/L (ref 135–145)
Total Bilirubin: 1 mg/dL (ref 0.3–1.2)
Total Protein: 8.3 g/dL — ABNORMAL HIGH (ref 6.5–8.1)

## 2019-05-11 LAB — C-REACTIVE PROTEIN: CRP: 19.5 mg/dL — ABNORMAL HIGH (ref <1.0)

## 2019-05-11 LAB — SEDIMENTATION RATE: Sed Rate: 9 mm/hr (ref 0–20)

## 2019-05-11 LAB — URIC ACID: Uric Acid, Serum: 10.8 mg/dL — ABNORMAL HIGH (ref 3.7–8.6)

## 2019-05-11 LAB — SARS CORONAVIRUS 2 BY RT PCR (HOSPITAL ORDER, PERFORMED IN ~~LOC~~ HOSPITAL LAB): SARS Coronavirus 2: NEGATIVE

## 2019-05-11 MED ORDER — LORATADINE 10 MG PO TABS
10.0000 mg | ORAL_TABLET | Freq: Every day | ORAL | Status: DC
  Administered 2019-05-12 – 2019-05-20 (×9): 10 mg via ORAL
  Filled 2019-05-11 (×9): qty 1

## 2019-05-11 MED ORDER — ONDANSETRON HCL 4 MG/2ML IJ SOLN: 4.0000 mg | Freq: Three times a day (TID) | INTRAMUSCULAR | Status: DC | PRN

## 2019-05-11 MED ORDER — ACETAMINOPHEN 325 MG PO TABS
650.0000 mg | ORAL_TABLET | Freq: Four times a day (QID) | ORAL | Status: DC | PRN
  Administered 2019-05-13 – 2019-05-17 (×5): 650 mg via ORAL
  Filled 2019-05-11 (×4): qty 2

## 2019-05-11 MED ORDER — OXYCODONE HCL 5 MG PO TABS
5.0000 mg | ORAL_TABLET | Freq: Once | ORAL | Status: AC
  Administered 2019-05-11: 5 mg via ORAL
  Filled 2019-05-11: qty 1

## 2019-05-11 MED ORDER — AMLODIPINE BESYLATE 10 MG PO TABS
10.0000 mg | ORAL_TABLET | Freq: Every day | ORAL | Status: DC
  Administered 2019-05-12 – 2019-05-20 (×9): 10 mg via ORAL
  Filled 2019-05-11 (×9): qty 1

## 2019-05-11 MED ORDER — MORPHINE SULFATE (PF) 2 MG/ML IV SOLN: 2.0000 mg | INTRAVENOUS | Status: DC | PRN

## 2019-05-11 MED ORDER — CARVEDILOL 12.5 MG PO TABS
12.5000 mg | ORAL_TABLET | Freq: Two times a day (BID) | ORAL | Status: DC
  Administered 2019-05-12 – 2019-05-20 (×17): 12.5 mg via ORAL
  Filled 2019-05-11 (×3): qty 1
  Filled 2019-05-11: qty 4
  Filled 2019-05-11 (×3): qty 1
  Filled 2019-05-11: qty 4
  Filled 2019-05-11: qty 1
  Filled 2019-05-11: qty 4
  Filled 2019-05-11: qty 1
  Filled 2019-05-11: qty 4
  Filled 2019-05-11: qty 1
  Filled 2019-05-11: qty 4
  Filled 2019-05-11: qty 1
  Filled 2019-05-11: qty 4
  Filled 2019-05-11: qty 1

## 2019-05-11 MED ORDER — SODIUM CHLORIDE 0.9 % IV SOLN: INTRAVENOUS | Status: DC

## 2019-05-11 MED ORDER — PREDNISONE 20 MG PO TABS
50.0000 mg | ORAL_TABLET | Freq: Once | ORAL | Status: AC
  Administered 2019-05-11: 50 mg via ORAL
  Filled 2019-05-11: qty 2

## 2019-05-11 MED ORDER — OXYCODONE-ACETAMINOPHEN 5-325 MG PO TABS
1.0000 | ORAL_TABLET | ORAL | Status: DC | PRN
  Administered 2019-05-12 – 2019-05-19 (×12): 1 via ORAL
  Filled 2019-05-11 (×13): qty 1

## 2019-05-11 MED ORDER — FEBUXOSTAT 40 MG PO TABS
40.0000 mg | ORAL_TABLET | Freq: Every day | ORAL | Status: DC
  Administered 2019-05-12 – 2019-05-19 (×8): 40 mg via ORAL
  Filled 2019-05-11 (×9): qty 1

## 2019-05-11 MED ORDER — SENNOSIDES-DOCUSATE SODIUM 8.6-50 MG PO TABS: 1.0000 | ORAL_TABLET | Freq: Every evening | ORAL | Status: DC | PRN

## 2019-05-11 MED ORDER — HYDRALAZINE HCL 20 MG/ML IJ SOLN
5.0000 mg | INTRAMUSCULAR | Status: DC | PRN
  Filled 2019-05-11: qty 0.25

## 2019-05-11 MED ORDER — PREDNISONE 10 MG PO TABS: ORAL_TABLET | ORAL | 0 refills | Status: DC

## 2019-05-11 MED ORDER — ENOXAPARIN SODIUM 40 MG/0.4ML ~~LOC~~ SOLN
40.0000 mg | SUBCUTANEOUS | Status: DC
  Administered 2019-05-11 – 2019-05-19 (×9): 40 mg via SUBCUTANEOUS
  Filled 2019-05-11 (×9): qty 0.4

## 2019-05-11 MED ORDER — PREDNISONE 20 MG PO TABS
20.0000 mg | ORAL_TABLET | Freq: Once | ORAL | Status: AC
  Administered 2019-05-12: 20 mg via ORAL
  Filled 2019-05-11: qty 1

## 2019-05-11 NOTE — ED Provider Notes (Signed)
[facility] Emergency Department Provider Note  ____________________________________________   First MD Initiated Contact with Patient 05/11/19 [phone]     (approximate)  I have reviewed the triage vital signs and the nursing notes.   HISTORY  Chief Complaint Foot Pain    HPI Michael Ramsey is a 64 y.o. male who comes in with EMS for bilateral foot pain and left wrist pain.  Patient reports having a history of gout.  He states that he has had gout flares before that have been in the bilateral feet at the same time.  Patient states that his medicines were changed for his gout back in March.  He is followed at [facility].  He is now on febuxostat  he has been taking it but on Sunday he started having some discomfort and swelling in his bilateral ankles and his left wrist.  He denies any history of septic joint, no fevers.  States this is very classic of his gout.  His pain is moderate, constant, worse with walking.  He is not take anything to help the pain.  He states that he normally goes to [facility] and they prescribed a medicine that helps it get better.  He is never seen a rheumatologist.  No surgeries on any of the prior joints mentioned.          Past Medical History:  Diagnosis Date  . Hypertension     Patient Active Problem List   Diagnosis Date Noted  . Acute renal failure (ARF) ([place]) 07/11/2015  . Syncope 07/11/2015    No past surgical history on file.  Prior to Admission medications   Medication Sig Start Date End Date Taking? Authorizing Provider  amLODipine (NORVASC) 5 MG tablet Take 5 mg by mouth daily.    [provider]  febuxostat (ULORIC) 40 MG tablet Take 40 mg by mouth daily.    [provider]  lisinopril (PRINIVIL,ZESTRIL) 40 MG tablet Take 1 tablet (40 mg total) by mouth daily. 07/14/15   Vaughan Basta, MD    Allergies Patient has no known allergies.  Family History  Problem Relation Age  of Onset  . Hypertension Other     Social History Social History   Tobacco Use  . Smoking status: Never Smoker  Substance Use Topics  . Alcohol use: No  . Drug use: Not on file      Review of Systems Constitutional: No fever/chills Eyes: No visual changes. ENT: No sore throat. Cardiovascular: Denies chest pain. Respiratory: Denies shortness of breath. Gastrointestinal: No abdominal pain.  No nausea, no vomiting.  No diarrhea.  No constipation. Genitourinary: Negative for dysuria. Musculoskeletal: Negative for back pain.  Positive joint pain Skin: Negative for rash. Neurological: Negative for headaches, focal weakness or numbness.  Positive difficulty walking All other ROS negative ____________________________________________   PHYSICAL EXAM:  VITAL SIGNS: ED Triage Vitals  Enc Vitals Group     BP 05/11/19 0346 (!) 172/104     Pulse Rate 05/11/19 0346 99     Resp 05/11/19 0346 18     Temp 05/11/19 0346 98.7 F (37.1 C)     Temp Source 05/11/19 0346 Oral     SpO2 05/11/19 0346 99 %     Weight 05/11/19 0344 175 lb (79.4 kg)     Height 05/11/19 0344 5\' 4"  (1.626 m)     Head Circumference --      Peak Flow --      Pain Score 05/11/19 0347  8     Pain Loc --      Pain Edu? --      Excl. in [place]? --     Constitutional: Alert and oriented. Well appearing and in no acute distress. Eyes: Conjunctivae are normal. EOMI. Head: Atraumatic. Nose: No congestion/rhinnorhea. Mouth/Throat: Mucous membranes are moist.   Neck: No stridor. Trachea Midline. FROM Cardiovascular: Normal rate, regular rhythm. Grossly normal heart sounds.  Good peripheral circulation. Respiratory: Normal respiratory effort.  No retractions. Lungs CTAB. Gastrointestinal: Soft and nontender. No distention. No abdominal bruits.  Musculoskeletal: Some mild swelling noted to the bilateral ankles and the left wrist.  Good distal pulses in all extremities.  He has slight redness hue with no obvious warmth,  tender with palpation but still able to range the joint.  No swelling of the legs or up the arm.  Swelling is just over the joint itself Neurologic:  Normal speech and language. No gross focal neurologic deficits are appreciated.  Skin:  Skin is warm, dry and intact. No rash noted. Psychiatric: Mood and affect are normal. Speech and behavior are normal. GU: Deferred   ____________________________________________   LABS (all labs ordered are listed, but only abnormal results are displayed)  Labs Reviewed  CBC WITH DIFFERENTIAL/PLATELET - Abnormal; Notable for the following components:      Result Value   WBC 19.0 (*)    Platelets 575 (*)    Neutro Abs 16.2 (*)    Abs Immature Granulocytes 0.11 (*)    All other components within normal limits  COMPREHENSIVE METABOLIC PANEL - Abnormal; Notable for the following components:   CO2 19 (*)    Glucose, Bld 130 (*)    BUN 46 (*)    Creatinine, Ser 2.06 (*)    Total Protein 8.3 (*)    GFR calc non Af Amer 33 (*)    GFR calc Af Amer 38 (*)    All other components within normal limits  URIC ACID - Abnormal; Notable for the following components:   Uric Acid, Serum 10.8 (*)    All other components within normal limits  SARS CORONAVIRUS 2 BY RT PCR (HOSPITAL ORDER, [facility])   ____________________________________________   PROCEDURES  Procedure(s) performed (including Critical Care):  Procedures   ____________________________________________   INITIAL IMPRESSION / ASSESSMENT AND PLAN / ED COURSE  Michael Ramsey was evaluated in Emergency Department on 05/11/2019 for the symptoms described in the history of present illness. He was evaluated in the context of the global COVID-19 pandemic, which necessitated consideration that the patient might be at risk for infection with the SARS-CoV-2 virus that causes COVID-19. Institutional protocols and algorithms that pertain to the evaluation of patients at risk for  COVID-19 are in a state of rapid change based on information released by regulatory bodies including the CDC and federal and state organizations. These policies and algorithms were followed during the patient's care in the ED.    Patient is a 64 year old with history of gout with multiple joints affected in the past who comes in with a another gout failure.  I suspect that given his history of this is gout or pseudogout.  There is a little bit of red hue to them but no significant warmth and he has no fever and I have lower suspicion for septic joint given patient's prior history.  Patient states that he has had flares before that have affected more than 1 joint.  He does not appear septic  or bacteremic.  He is very well-appearing.  Will get basic labs to further evaluate  Patient uric acid slightly elevated.  White count elevated at 19 but again patient is otherwise very well-appearing and I do not see signs of sepsis or bacteremic.  He was slightly tachycardic when he first arrived but after controlling his pain his heart rates went down into the 80s Kidney function 2.06 but around patient's baseline at 1.78  3 years ago  Given patient's kidney function is a little difficult to use colchicine or ibuprofen.  Discussed with patient a course of steroids and oxycodone for pain and to follow-up with the [facility].  Also given rheumatology number for further follow-up as well.  Patient understands he needs to follow-up in 2 days.  He understands that if his symptoms are worsening he needs to return to the ER especially he develops fevers  Attempted to stand patient up and patient is very wobbly on his feet and is not able to ambulate secondary to bilateral gout on both of his ankles.  Patient is not safe for discharge home due to his gout and difficulties walking.  Will discuss with the hospital team for admission.        ____________________________________________   FINAL CLINICAL  IMPRESSION(S) / ED DIAGNOSES   Final diagnoses:  Acute gout of multiple sites, unspecified cause  Unable to ambulate      MEDICATIONS GIVEN DURING THIS VISIT:  Medications  oxyCODONE-acetaminophen (PERCOCET/ROXICET) 5-325 MG per tablet 1 tablet (has no administration in time range)  morphine 2 MG/ML injection 2 mg (has no administration in time range)  oxyCODONE (Oxy IR/ROXICODONE) immediate release tablet 5 mg (5 mg Oral Given 05/11/19 0911)  predniSONE (DELTASONE) tablet 50 mg (50 mg Oral Given 05/11/19 0911)     ED Discharge Orders         Ordered    predniSONE (DELTASONE) 10 MG tablet  Status:  Discontinued     05/11/19 1033           Note:  This document was prepared using Dragon voice recognition software and may include unintentional dictation errors.   Vanessa Wasola, MD 05/11/19 1146

## 2019-05-11 NOTE — ED Triage Notes (Addendum)
Patient to ED via [facility] for bilateral foot pain.  Reports history of gout.  Reports ongoing since March and medication change.

## 2019-05-11 NOTE — Progress Notes (Signed)
PHARMACIST - PHYSICIAN COMMUNICATION  CONCERNING:  Enoxaparin (Lovenox) for DVT Prophylaxis    RECOMMENDATION: Patient was prescribed enoxaprin 30mg  q24 hours for VTE prophylaxis.   Filed Weights   05/11/19 0344  Weight: 79.4 kg (175 lb)    Body mass index is 30.04 kg/m.  Estimated Creatinine Clearance: 34 mL/min (A) (by C-G formula based on SCr of 2.06 mg/dL (H)).  Patient is candidate for enoxaparin 40mg  every 24 hours based on CrCl >21ml/min AND Weight >57kg for men   DESCRIPTION: Pharmacy has adjusted enoxaparin dose per [facility] policy.  Patient is now receiving enoxaparin 40mg  every 24 hours.    Lu Duffel, PharmD, BCPS Clinical Pharmacist 05/11/2019 1:38 PM

## 2019-05-11 NOTE — H&P (Addendum)
History and Physical    Michael Ramsey XTK:[id number] DOB: June 03, 1955 DOA: 05/11/2019  Referring MD/NP/PA:   PCP: Marguerita Merles, MD   Patient coming from:  The patient is coming from home.  At baseline, pt is independent for most of ADL.        Chief Complaint: joint pain  HPI: Michael Ramsey is a 64 y.o. male with medical history significant of gout, HTN and CKD-IV, who presents with joint pain.  Patient states that she started having joint pain, including bilateral ankle, left wrist and left elbow.  Her pain is constant, moderate, sharp, nonradiating.  All this joints also use swelling, and mildly erythematous.  Patient does not have fever or chills.  No injury.  Patient states that this is typical for his gout flare. Pt is followed at [facility] clinic.  He is now on febuxostat. Currently, pt is unable to walk due to severe pain in both ankles.  ED Course: pt was found to have uric acid 13.8, pending COVID-19 PCR, WBC 19.0, worsening renal function, temperature normal, blood pressure 176/101, heart rate 100, RR 24, oxygen saturation 98% on room air.  Patient is placed on MedSurg bed for observation.  Review of Systems:   General: no fevers, chills, no body weight gain, has fatigue HEENT: no blurry vision, hearing changes or sore throat Respiratory: no dyspnea, coughing, wheezing CV: no chest pain, no palpitations GI: no nausea, vomiting, abdominal pain, diarrhea, constipation GU: no dysuria, burning on urination, increased urinary frequency, hematuria  Ext: no leg edema Neuro: no unilateral weakness, numbness, or tingling, no vision change or hearing loss Skin: no rash, no skin tear. MSK: has joint pain in bilateral ankles, left wrist and left elbow Heme: No easy bruising.  Travel history: No recent long distant travel.  Allergy: No Known Allergies  Past Medical History:  Diagnosis Date  . CKD (chronic kidney disease), stage IV ([place])   . Gout   . Hypertension     Past  Surgical History:  Procedure Laterality Date  . CHOLECYSTECTOMY      Social History:  reports that he has never smoked. He does not have any smokeless tobacco history on file. He reports that he does not drink alcohol. No history on file for drug.  Family History:  Family History  Problem Relation Age of Onset  . Hypertension Other      Prior to Admission medications   Medication Sig Start Date End Date Taking? Authorizing Provider  amLODipine (NORVASC) 5 MG tablet Take 5 mg by mouth daily.    [provider]  febuxostat (ULORIC) 40 MG tablet Take 40 mg by mouth daily.    [provider]  lisinopril (PRINIVIL,ZESTRIL) 40 MG tablet Take 1 tablet (40 mg total) by mouth daily. 07/14/15   Vaughan Basta, MD  predniSONE (DELTASONE) 10 MG tablet Take 5 tablets (50 mg total) by mouth as directed for 2 days, THEN 4 tablets (40 mg total) as directed for 2 days, THEN 3 tablets (30 mg total) as directed for 2 days, THEN 2 tablets (20 mg total) as directed for 2 days, THEN 1 tablet (10 mg total) as directed for 2 days. 05/11/19 05/21/19  Vanessa Union Level, MD    Physical Exam: Vitals:   05/11/19 1230 05/11/19 1400 05/11/19 1500 05/11/19 1600  BP:  (!) 166/101 (!) 175/79 (!) 158/89  Pulse: 87     Resp:      Temp:      TempSrc:  SpO2: 99%     Weight:      Height:       General: Not in acute distress HEENT:       Eyes: PERRL, EOMI, no scleral icterus.       ENT: No discharge from the ears and nose, no pharynx injection, no tonsillar enlargement.        Neck: No JVD, no bruit, no mass felt. Heme: No neck lymph node enlargement. Cardiac: S1/S2, RRR, No murmurs, No gallops or rubs. Respiratory: No rales, wheezing, rhonchi or rubs. GI: Soft, nondistended, nontender, no rebound pain, no organomegaly, BS present. GU: No hematuria Ext: No pitting leg edema bilaterally. 2+DP/PT pulse bilaterally. Musculoskeletal: Has swelling, tenderness, mild erythema in bilateral ankles,  left wrist and left elbow Skin: No rashes.  Neuro: Alert, oriented X3, cranial nerves II-XII grossly intact, moves all extremities  Psych: Patient is not psychotic, no suicidal or hemocidal ideation.  Labs on Admission: I have personally reviewed following labs and imaging studies  CBC: Recent Labs  Lab 05/11/19 0347  WBC 19.0*  NEUTROABS 16.2*  HGB 13.4  HCT 41.9  MCV 87.5  PLT 364*   Basic Metabolic Panel: Recent Labs  Lab 05/11/19 0347  NA 136  K 4.2  CL 103  CO2 19*  GLUCOSE 130*  BUN 46*  CREATININE 2.06*  CALCIUM 9.4   GFR: Estimated Creatinine Clearance: 34 mL/min (A) (by C-G formula based on SCr of 2.06 mg/dL (H)). Liver Function Tests: Recent Labs  Lab 05/11/19 0347  AST 31  ALT 41  ALKPHOS 120  BILITOT 1.0  PROT 8.3*  ALBUMIN 3.5   No results for input(s): LIPASE, AMYLASE in the last 168 hours. No results for input(s): AMMONIA in the last 168 hours. Coagulation Profile: No results for input(s): INR, PROTIME in the last 168 hours. Cardiac Enzymes: No results for input(s): CKTOTAL, CKMB, CKMBINDEX, TROPONINI in the last 168 hours. BNP (last 3 results) No results for input(s): PROBNP in the last 8760 hours. HbA1C: No results for input(s): HGBA1C in the last 72 hours. CBG: No results for input(s): GLUCAP in the last 168 hours. Lipid Profile: No results for input(s): CHOL, HDL, LDLCALC, TRIG, CHOLHDL, LDLDIRECT in the last 72 hours. Thyroid Function Tests: No results for input(s): TSH, T4TOTAL, FREET4, T3FREE, THYROIDAB in the last 72 hours. Anemia Panel: No results for input(s): VITAMINB12, FOLATE, FERRITIN, TIBC, IRON, RETICCTPCT in the last 72 hours. Urine analysis:    Component Value Date/Time   COLORURINE STRAW (A) 07/12/2015 1139   APPEARANCEUR CLEAR (A) 07/12/2015 1139   LABSPEC 1.006 07/12/2015 1139   PHURINE 6.0 07/12/2015 1139   GLUCOSEU NEGATIVE 07/12/2015 1139   HGBUR 1+ (A) 07/12/2015 1139   BILIRUBINUR NEGATIVE 07/12/2015 1139     KETONESUR NEGATIVE 07/12/2015 1139   PROTEINUR NEGATIVE 07/12/2015 1139   NITRITE NEGATIVE 07/12/2015 1139   LEUKOCYTESUR NEGATIVE 07/12/2015 1139   Sepsis Labs: @LABRCNTIP (procalcitonin:4,lacticidven:4) ) Recent Results (from the past 240 hour(s))  SARS Coronavirus 2 by RT PCR (hospital order, performed in [place] hospital lab) Nasopharyngeal Nasopharyngeal Swab     Status: None   Collection Time: 05/11/19 11:31 AM   Specimen: Nasopharyngeal Swab  Result Value Ref Range Status   SARS Coronavirus 2 NEGATIVE NEGATIVE Final    Comment: (NOTE) SARS-CoV-2 target nucleic acids are NOT DETECTED. The SARS-CoV-2 RNA is generally detectable in upper and lower respiratory specimens during the acute phase of infection. The lowest concentration of SARS-CoV-2 viral copies this assay can detect  is 250 copies / mL. A negative result does not preclude SARS-CoV-2 infection and should not be used as the sole basis for treatment or other patient management decisions.  A negative result may occur with improper specimen collection / handling, submission of specimen other than nasopharyngeal swab, presence of viral mutation(s) within the areas targeted by this assay, and inadequate number of viral copies (<250 copies / mL). A negative result must be combined with clinical observations, patient history, and epidemiological information. Fact Sheet for Patients:   StrictlyIdeas.no Fact Sheet for Healthcare Providers: BankingDealers.co.za This test is not yet approved or cleared  by the [place] FDA and has been authorized for detection and/or diagnosis of SARS-CoV-2 by FDA under an Emergency Use Authorization (EUA).  This EUA will remain in effect (meaning this test can be used) for the duration of the COVID-19 declaration under Section 564(b)(1) of the Act, 21 U.S.C. section 360bbb-3(b)(1), unless the authorization is terminated or revoked  sooner. Performed at [facility], [address], [place], [place] [address]      Radiological Exams on Admission: No results found.   EKG:  Not done in ED, will get one.   Assessment/Plan Principal Problem:   Gout flare Active Problems:   CKD (chronic kidney disease), stage IV (H[facility])   Hypertension   Leukocytosis   Gout flare: Patient's joint pain in bilateral ankles, left wrist and the left elbow is most likely due to gout flare.  Patient states that this is typical symptoms of gout flare for him.  Patient has leukocytosis, but no fever, currently low suspicions for septic joint.  Patient has stage IV CKD, and is not a good candidate for using colchicine or NSAID  -Placed on MedSurg bed for observation -Prednisone: Patient received 50 mg of prednisone in ED, will continue 20 mg daily -As needed morphine and Percocet for pain -continue Uloric -PT/OT  CKD (chronic kidney disease), stage IV ([place]): Baseline creatinine 1.78 on 07/12/2015.  His creatinine is 2.06, BUN 46, not sure this is acutely worsening or progression during the past several years. -Avoid using kidney toxic medications -IV fluid: 100 cc normal saline per hour -Follow-up by BMP  HTN: Bp 176/101 -Continue home medications: Coreg and Amlodipine, increase amlodipine dose from 5 to 10 mg -hydralazine prn  Leukocytosis: Likely reactive.  No fever. -f/u Blood culture       DVT ppx: SQ Lovenox Code Status: Full code Family Communication: not done, no family member is at bed side. Disposition Plan:  Anticipate discharge back to previous home environment Consults called:  nopne Admission status: Med-surg bed for obs     Status is: Observation  The patient remains OBS appropriate and will d/c before 2 midnights.  Dispo: The patient is from: Home              Anticipated d/c is to: Home              Anticipated d/c date is: 1 day              Patient currently is not medically stable to  d/c.           Date of Service 05/11/2019    Ivor Costa [facility]   If 7PM-7AM, please contact night-coverage [email or web address] 05/11/2019, 5:00 PM

## 2019-05-11 NOTE — ED Notes (Signed)
Assigned bed @ 7096, Spoke with [employer].

## 2019-05-11 NOTE — ED Notes (Signed)
Transport request submitted.  

## 2019-05-12 DIAGNOSIS — M10031 Idiopathic gout, right wrist: Secondary | ICD-10-CM | POA: Diagnosis present

## 2019-05-12 DIAGNOSIS — D72823 Leukemoid reaction: Secondary | ICD-10-CM | POA: Diagnosis not present

## 2019-05-12 DIAGNOSIS — I10 Essential (primary) hypertension: Secondary | ICD-10-CM | POA: Diagnosis not present

## 2019-05-12 DIAGNOSIS — M10072 Idiopathic gout, left ankle and foot: Principal | ICD-10-CM

## 2019-05-12 DIAGNOSIS — M10022 Idiopathic gout, left elbow: Secondary | ICD-10-CM | POA: Diagnosis present

## 2019-05-12 DIAGNOSIS — L539 Erythematous condition, unspecified: Secondary | ICD-10-CM | POA: Diagnosis not present

## 2019-05-12 DIAGNOSIS — R509 Fever, unspecified: Secondary | ICD-10-CM | POA: Diagnosis not present

## 2019-05-12 DIAGNOSIS — M10071 Idiopathic gout, right ankle and foot: Secondary | ICD-10-CM | POA: Diagnosis present

## 2019-05-12 DIAGNOSIS — D72829 Elevated white blood cell count, unspecified: Secondary | ICD-10-CM | POA: Diagnosis present

## 2019-05-12 DIAGNOSIS — Z20822 Contact with and (suspected) exposure to covid-19: Secondary | ICD-10-CM | POA: Diagnosis present

## 2019-05-12 DIAGNOSIS — N179 Acute kidney failure, unspecified: Secondary | ICD-10-CM | POA: Diagnosis present

## 2019-05-12 DIAGNOSIS — M25571 Pain in right ankle and joints of right foot: Secondary | ICD-10-CM | POA: Diagnosis not present

## 2019-05-12 DIAGNOSIS — M10032 Idiopathic gout, left wrist: Secondary | ICD-10-CM | POA: Diagnosis present

## 2019-05-12 DIAGNOSIS — I129 Hypertensive chronic kidney disease with stage 1 through stage 4 chronic kidney disease, or unspecified chronic kidney disease: Secondary | ICD-10-CM | POA: Diagnosis present

## 2019-05-12 DIAGNOSIS — Z79899 Other long term (current) drug therapy: Secondary | ICD-10-CM | POA: Diagnosis not present

## 2019-05-12 DIAGNOSIS — R001 Bradycardia, unspecified: Secondary | ICD-10-CM | POA: Diagnosis not present

## 2019-05-12 DIAGNOSIS — M109 Gout, unspecified: Secondary | ICD-10-CM | POA: Diagnosis present

## 2019-05-12 DIAGNOSIS — M79645 Pain in left finger(s): Secondary | ICD-10-CM | POA: Diagnosis not present

## 2019-05-12 DIAGNOSIS — N184 Chronic kidney disease, stage 4 (severe): Secondary | ICD-10-CM | POA: Diagnosis present

## 2019-05-12 LAB — CBC
HCT: 34.7 % — ABNORMAL LOW (ref 39.0–52.0)
Hemoglobin: 10.8 g/dL — ABNORMAL LOW (ref 13.0–17.0)
MCH: 27.2 pg (ref 26.0–34.0)
MCHC: 31.1 g/dL (ref 30.0–36.0)
MCV: 87.4 fL (ref 80.0–100.0)
Platelets: 440 10*3/uL — ABNORMAL HIGH (ref 150–400)
RBC: 3.97 MIL/uL — ABNORMAL LOW (ref 4.22–5.81)
RDW: 14.1 % (ref 11.5–15.5)
WBC: 17.1 10*3/uL — ABNORMAL HIGH (ref 4.0–10.5)
nRBC: 0 % (ref 0.0–0.2)

## 2019-05-12 LAB — BASIC METABOLIC PANEL
Anion gap: 8 (ref 5–15)
BUN: 54 mg/dL — ABNORMAL HIGH (ref 8–23)
CO2: 19 mmol/L — ABNORMAL LOW (ref 22–32)
Calcium: 8.6 mg/dL — ABNORMAL LOW (ref 8.9–10.3)
Chloride: 108 mmol/L (ref 98–111)
Creatinine, Ser: 1.98 mg/dL — ABNORMAL HIGH (ref 0.61–1.24)
GFR calc Af Amer: 40 mL/min — ABNORMAL LOW (ref >60)
GFR calc non Af Amer: 34 mL/min — ABNORMAL LOW (ref >60)
Glucose, Bld: 122 mg/dL — ABNORMAL HIGH (ref 70–99)
Potassium: 4.3 mmol/L (ref 3.5–5.1)
Sodium: 135 mmol/L (ref 135–145)

## 2019-05-12 NOTE — Plan of Care (Signed)

## 2019-05-12 NOTE — Evaluation (Signed)
Occupational Therapy Evaluation Patient Details Name: Michael Ramsey MRN: [id number] DOB: [birth year] Today's Date: 05/12/2019    History of Present Illness 64 y/o man here 2/2 inability to walk.  Pt with history of gout flares, and he reports current joint pain (b/l LEs and L UE, especially ankles) is typical for these flares.     Clinical Impression   Michael Ramsey was seen for OT evaluation this date. Prior to hospital admission, pt was generally independent with ADL/IADL management. He endorses driving and and running errands independently. Pt lives alone with intermittent assistance from a neighbor when experiencing gout flare-ups. Currently pt demonstrates impairments as described below (See OT problem list) which functionally limit his/her ability to perform ADL/self-care tasks. Pt currently requires MOD A for LB ADL management in a seated position, set-up/sup for UB ADL management, and requires bed level toileting at this time as he is unable to perform functional mobility with +1 MAX/TOTAL assist this date.  Pt would benefit from skilled OT to address noted impairments and functional limitations (see below for any additional details) in order to maximize safety and independence while minimizing falls risk and caregiver burden. Upon hospital discharge, recommend STR to maximize pt safety and return to PLOF.      Follow Up Recommendations  SNF    Equipment Recommendations  3 in 1 bedside commode    Recommendations for Other Services       Precautions / Restrictions Precautions Precautions: Fall Restrictions Weight Bearing Restrictions: No      Mobility Bed Mobility Overal bed mobility: Needs Assistance Bed Mobility: Supine to Sit;Sit to Supine     Supine to sit: Min assist Sit to supine: Min assist      Transfers Overall transfer level: Needs assistance Equipment used: Rolling walker (2 wheeled) Transfers: Sit to/from Stand Sit to Stand: Total assist          General transfer comment: 2 unsuccessful attempts at standing. Pt is unable bend knees to 90 and heavy assist to attain "standing" he ultimately unable to come to full upright stand.    Balance Overall balance assessment: Needs assistance Sitting-balance support: No upper extremity supported;Feet unsupported Sitting balance-Leahy Scale: Fair     Standing balance support: Bilateral upper extremity supported Standing balance-Leahy Scale: Poor Standing balance comment: Pt unable to effectively use UEs/AD to maintain standing, poor tolerance                           ADL either performed or assessed with clinical judgement   ADL Overall ADL's : Needs assistance/impaired                                       General ADL Comments: Pt functionally limited by increased pain in BLE/UE, decreased activity tolerance, and generalized weakness. He is unable to perform functional transfer to room chair with MAX A +1 today. MOD A to don hospital socks on BLE with sock aid. Pt has difficulty pulling sock aid up due to decreased [place] and weakness in BUE.     Vision Patient Visual Report: No change from baseline       Perception     Praxis      Pertinent Vitals/Pain Pain Assessment: 0-10 Pain Score: 3  Pain Location: b/l LEs (ankles and knees) Pain Descriptors / Indicators: Sore;Aching;Discomfort Pain Intervention(s): Limited activity within patient's tolerance;Monitored  during session;Repositioned     Hand Dominance     Extremity/Trunk Assessment Upper Extremity Assessment Upper Extremity Assessment: Generalized weakness(Decreased AROM in BLE with decreases shoulder flexion unable to attain above 90degrees. BUE swollen with decreased [place] noted during functional tasks. Pt generally weak t/o.)   Lower Extremity Assessment Lower Extremity Assessment: Generalized weakness;Defer to PT evaluation       Communication Communication Communication: No difficulties    Cognition Arousal/Alertness: Awake/alert Behavior During Therapy: WFL for tasks assessed/performed Overall Cognitive Status: Within Functional Limits for tasks assessed                                     General Comments       Exercises Other Exercises Other Exercises: Pt educated on falls prevention strategies, safe use of AE/DME for ADL management, and routines modifications to support safety and functional independence upon hospital DC. Other Exercises: OT engages pt in functional tasks including LB dressing using sock aid to don bilat hospital socks. He requires MOD A to don socks.   Shoulder Instructions      Home Living Family/patient expects to be discharged to:: Unsure Living Arrangements: Alone                               Additional Comments: Pt has 4WW and ramp at home, has a neighbor that can inconsistently assist      Prior Functioning/Environment Level of Independence: Independent with assistive device(s)        Comments: Pt reports that he drives and runs his errands, out of the house regularly, typically able to ambulate PRN with 4WW        OT Problem List: Decreased strength;Decreased coordination;Pain;Decreased activity tolerance;Decreased safety awareness;Impaired balance (sitting and/or standing);Decreased knowledge of use of DME or AE;Impaired UE functional use;Increased edema      OT Treatment/Interventions: Self-care/ADL training;Therapeutic exercise;Therapeutic activities;DME and/or AE instruction;Patient/family education;Balance training;Energy conservation    OT Goals(Current goals can be found in the care plan section) Acute Rehab OT Goals Patient Stated Goal: To get stronger and go home OT Goal Formulation: With patient Time For Goal Achievement: 05/26/19 Potential to Achieve Goals: Good ADL Goals Pt Will Perform Upper Body Dressing: sitting;with modified independence Pt Will Perform Lower Body Dressing: sit  to/from stand;with supervision;with set-up;with adaptive equipment(C LRAD PRN for improved safety/functional independence.) Pt Will Transfer to Toilet: bedside commode;with supervision;with set-up;stand pivot transfer(C LRAD PRN for improved safety/functional independence.) Pt Will Perform Toileting - Clothing Manipulation and hygiene: sit to/from stand;with set-up;with supervision;with adaptive equipment(C LRAD PRN for improved safety/functional independence.)  OT Frequency: Min 1X/week   Barriers to D/C: Decreased caregiver support;Inaccessible home environment          Co-evaluation              AM-PAC OT "6 Clicks" Daily Activity     Outcome Measure Help from another person eating meals?: A Little Help from another person taking care of personal grooming?: A Little Help from another person toileting, which includes using toliet, bedpan, or urinal?: A Lot Help from another person bathing (including washing, rinsing, drying)?: A Lot Help from another person to put on and taking off regular upper body clothing?: A Little Help from another person to put on and taking off regular lower body clothing?: A Lot 6 Click Score: 15   End of Session Equipment  Utilized During Treatment: Gait belt;Rolling walker  Activity Tolerance: Patient tolerated treatment well Patient left: in bed;with call bell/phone within reach;with bed alarm set  OT Visit Diagnosis: Other abnormalities of gait and mobility (R26.89);Muscle weakness (generalized) (M62.81);Pain Pain - Right/Left: (both) Pain - part of body: Ankle and joints of foot;Leg;Arm;Hand                Time: 6438-3779 OT Time Calculation (min): 34 min Charges:  OT General Charges $OT Visit: 1 Visit OT Evaluation $OT Eval Moderate Complexity: 1 Mod OT Treatments $Self Care/Home Management : 23-37 mins  Shara Blazing, M.S., OTR/L Ascom: [phone] 05/12/19, 4:32 PM

## 2019-05-12 NOTE — Evaluation (Addendum)
Physical Therapy Evaluation Patient Details Name: Michael Ramsey MRN: [id number] DOB: 1955-06-04 Today's Date: 05/12/2019   History of Present Illness  64 y/o man here 2/2 inability to walk.  Pt with history of gout flares, and he reports current joint pain (b/l LEs and L UE, especially ankles) is typical for these flares.    Clinical Impression  Despite c/o pain pt showed good effort with PT exam and multiple attempts at standing but each attempt was a struggle to attain even brief upright EOB stand with very heavy assist to get to and maintain the position. He ultimately struggled to rise to standing 2/2 pain and very cautious guarding.  He initially showed much better confidence - but in sitting at EOB was unable to don socks, struggled to even scoot himself toward EOB and then finally struggled significantly with multiple attempts at standing.   Follow Up Recommendations SNF    Equipment Recommendations  (TBD at next venue of care)    Recommendations for Other Services       Precautions / Restrictions Precautions Precautions: Fall Restrictions Weight Bearing Restrictions: No      Mobility  Bed Mobility Overal bed mobility: Needs Assistance Bed Mobility: Supine to Sit;Sit to Supine     Supine to sit: Min assist Sit to supine: Min assist   General bed mobility comments: Pt initially makes as though he could sit right up but ultimately did need direct assist to attain EOB, assist to scoot toward EOB to square up  Transfers Overall transfer level: Needs assistance Equipment used: Rolling walker (2 wheeled) Transfers: Sit to/from Stand Sit to Stand: Total assist         General transfer comment: 2 unsuccessful attempts at standing.  Even with plenty of assist for set up (unable bend knees to 90 so raised bed) and heavy assist to attain "standing" he ultimately showed little to no tolerance for standing c/o pain in b/l LEs ([place]kles, et al) and inability to sustain meaningful  use of UEs on walker.  Both attempts were short lived, heavily assisted "stands."  Ambulation/Gait             General Gait Details: Unable/unsafe to attempt today  Stairs            Wheelchair Mobility    Modified Rankin (Stroke Patients Only)       Balance Overall balance assessment: Needs assistance Sitting-balance support: No upper extremity supported Sitting balance-Leahy Scale: Fair     Standing balance support: Bilateral upper extremity supported Standing balance-Leahy Scale: Poor Standing balance comment: Pt unable to effectively use UEs/AD to maintain standing, poor tolerance                             Pertinent Vitals/Pain Pain Assessment: 0-10 Pain Score: 8  Pain Location: b/l LEs (ankles and knees)    Home Living Family/patient expects to be discharged to:: Unsure Living Arrangements: Alone               Additional Comments: Pt has 4WW and ramp at home, has a neighbor that can inconsistently assist    Prior Function Level of Independence: Independent with assistive device(s)         Comments: Pt reports that he drives and runs his errands, out of the house regularly, typically able to ambulate PRN with 4WW     Hand Dominance        Extremity/Trunk Assessment  Upper Extremity Assessment Upper Extremity Assessment: Generalized weakness(b/l shld lack elev >90, guarded/stiff with all AROM in UEs)    Lower Extremity Assessment Lower Extremity Assessment: Generalized weakness(lacks knee flexion >90 b/l, minimal ankle DF/PF)       Communication   Communication: No difficulties  Cognition Arousal/Alertness: Awake/alert Behavior During Therapy: WFL for tasks assessed/performed Overall Cognitive Status: Within Functional Limits for tasks assessed                                        General Comments General comments (skin integrity, edema, etc.): Pt showed good effort in getting socks on while  sitting EOB but try-as-he-may he could not don socks w/o assist    Exercises     Assessment/Plan    PT Assessment Patient needs continued PT services  PT Problem List Decreased strength;Decreased activity tolerance;Decreased range of motion;Decreased balance;Decreased mobility;Decreased coordination;Decreased knowledge of use of DME;Decreased safety awareness;Pain       PT Treatment Interventions DME instruction;Gait training;Functional mobility training;Therapeutic activities;Therapeutic exercise;Neuromuscular re-education;Balance training;Patient/family education    PT Goals (Current goals can be found in the Care Plan section)  Acute Rehab PT Goals Patient Stated Goal: go home PT Goal Formulation: With patient Time For Goal Achievement: 05/26/19 Potential to Achieve Goals: Fair    Frequency Min 2X/week   Barriers to discharge        Co-evaluation               AM-PAC PT "6 Clicks" Mobility  Outcome Measure Help needed turning from your back to your side while in a flat bed without using bedrails?: A Little Help needed moving from lying on your back to sitting on the side of a flat bed without using bedrails?: A Little Help needed moving to and from a bed to a chair (including a wheelchair)?: Total Help needed standing up from a chair using your arms (e.g., wheelchair or bedside chair)?: Total Help needed to walk in hospital room?: Total Help needed climbing 3-5 steps with a railing? : Total 6 Click Score: 10    End of Session Equipment Utilized During Treatment: Gait belt Activity Tolerance: Patient limited by fatigue Patient left: with call bell/phone within reach;with bed alarm set Nurse Communication: Mobility status PT Visit Diagnosis: Muscle weakness (generalized) (M62.81);Difficulty in walking, not elsewhere classified (R26.2)    Time: 4970-2637 PT Time Calculation (min) (ACUTE ONLY): 25 min   Charges:   PT Evaluation $PT Eval Low Complexity: 1  Low PT Treatments $Therapeutic Activity: 8-22 mins        Kreg Shropshire, DPT 05/12/2019, 12:16 PM

## 2019-05-12 NOTE — Progress Notes (Signed)
PROGRESS NOTE  Michael Ramsey HYQ:[id number] DOB: [birth year] DOA: 05/11/2019 PCP: Marguerita Merles, MD  Brief History   Michael Ramsey is a 64 y.o. male with medical history significant of gout, HTN and CKD-IV, who presents with joint pain.  Patient states that she started having joint pain, including bilateral ankle, left wrist and left elbow.  Her pain is constant, moderate, sharp, nonradiating.  All this joints also use swelling, and mildly erythematous.  Patient does not have fever or chills.  No injury.  Patient states that this is typical for his gout flare. Pt is followed at [facility] clinic. He is now on febuxostat. Currently, pt is unable to walk due to severe pain in both ankles.  ED Course: pt was found to have uric acid 13.8, pending COVID-19 PCR, WBC 19.0, worsening renal function, temperature normal, blood pressure 176/101, heart rate 100, RR 24, oxygen saturation 98% on room air.  Patient is placed on MedSurg bed for observation.  The patient was admitted to a medical bed. He was given 50 mg of prednisone in the ED. He has been continued on 20 mg of prednisone daily. He states that he is unable to walk due to pain in his ankles.  Consultants  . None  Antibiotics   Anti-infectives (From admission, onward)   None    .  Subjective  The patient is resting comfortably. No new complaints.  Objective   Vitals:  Vitals:   05/12/19 0557 05/12/19 0807  BP: 135/73 112/76  Pulse: 66 66  Resp: 18 16  Temp: 98.9 F (37.2 C) 98.3 F (36.8 C)  SpO2: 98% 98%    Exam:  Constitutional:  . The patient is awake, alert, and oriented x 3. No acute distress. Respiratory:  . No increased work of breathing. . No wheezes, rales, or rhonchi . No tactile fremitus Cardiovascular:  . Regular rate and rhythm . No murmurs, ectopy, or gallups. . No lateral PMI. No thrills. Abdomen:  . Abdomen is soft, non-tender, non-distended . No hernias, masses, or organomegaly . Normoactive  bowel sounds.  Musculoskeletal:  . No cyanosis, clubbing, or edema . Ankles bilaterally were erythematous, warm to touch, and swollen. They are tender to touch. Skin:  . No rashes, lesions, ulcers . palpation of skin: no induration or nodules Neurologic:  . CN 2-12 intact . Sensation all 4 extremities intact Psychiatric:  . Mental status o Mood, affect appropriate o Orientation to person, place, time  . judgment and insight appear intact  I have personally reviewed the following:   Today's Data  . Vitals, BMP, CBC  Micro Data  . Blood cultures x 2: No growth  Cardiology Data  . EKG  Scheduled Meds: . amLODipine  10 mg Oral Daily  . carvedilol  12.5 mg Oral BID WC  . enoxaparin (LOVENOX) injection  40 mg Subcutaneous Q24H  . febuxostat  40 mg Oral Daily  . loratadine  10 mg Oral Daily   Continuous Infusions: . sodium chloride 100 mL/hr at 05/12/19 1302    Principal Problem:   Gout flare Active Problems:   CKD (chronic kidney disease), stage IV (HCC)   Hypertension   Leukocytosis   LOS: 0 days   A & P   Gout flare: Patient's joint pain in bilateral ankles, left wrist and the left elbow is most likely due to gout flare.  Patient states that this is typical symptoms of gout flare for him.  Patient has leukocytosis, but no fever, currently  low suspicions for septic joint.  Patient has stage IV CKD, and is not a good candidate for using colchicine or NSAID. Patient received 50 mg of prednisone in ED, will continue 20 mg daily. He will receive as needed morphine and Percocet for pain. He will be continued on Uloric and he will be evaluated by PT/OT when he is able to participate.  CKD (chronic kidney disease), stage IV ([place]): Baseline creatinine 1.78 on 07/12/2015.  His creatinine was 2.06 and BUN 46, on admission. Creatinine is down to 1.98 today.   HTN: The patient has been continued on Coreg and Amlodipine. Amlodipine has been increased from from 5 to 10 mg to better  control blood pressures. Hydralazine is available on an as needed basis.  Leukocytosis: Likely reactive.  No fever.  Monitor.  I have seen and examined this patient myself. I have spent 32 minutes in his evaluation and care. DVT ppx: SQ Lovenox Code Status: Full code Family Communication: None available Disposition Plan:  The patient is from home. Anticipate discharge back to home. Barriers to discharge. The patient is unable to walk due to pain in ankles. To be evaluated by PT/OT.  Michael Kramar, DO [facility] Direct contact: see [email or web address]  7PM-7AM contact night coverage as above 05/12/2019, 2:19 PM  LOS: 0 days

## 2019-05-12 NOTE — TOC Progression Note (Signed)
Transition of Care [facility]) - Progression Note    Patient Details  Name: Michael Ramsey MRN: [id number] Date of Birth: 08/19/1955  Transition of Care [facility]) CM/SW [place], RN Phone Number: 05/12/2019, 10:23 AM  Clinical Narrative:     Manuela Neptune and bedsearch sent will review the bed offers with the patient once obtained       Expected Discharge Plan and Services                                                 Social Determinants of Health (SDOH) Interventions    Readmission Risk Interventions No flowsheet data found.

## 2019-05-12 NOTE — NC FL2 (Signed)
[place] LEVEL OF CARE SCREENING TOOL     IDENTIFICATION  Patient Name: Michael Ramsey Birthdate: [birth year] Sex: male Admission Date (Current Location): 05/11/2019  [place] and [place] Number:  Engineering geologist and Address:  [facility], [address] [place], [place] [address]      Provider Number: [id number]  Attending Physician Name and Address:  Karie Kirks, DO  Relative Name and Phone Number:  Brynda Greathouse [phone]    Current Level of Care: Hospital Recommended Level of Care: [place] Prior Approval Number:    Date Approved/Denied:   PASRR Number: [id number]  Discharge Plan: SNF    Current Diagnoses: Patient Active Problem List   Diagnosis Date Noted  . CKD (chronic kidney disease), stage IV ([place]) 05/11/2019  . Hypertension   . Gout flare   . Leukocytosis   . Acute renal failure (ARF) ([place]) 07/11/2015  . Syncope 07/11/2015    Orientation RESPIRATION BLADDER Height & Weight     Self, Time, Situation, Place  Normal Continent Weight: 79.4 kg Height:  5\' 4"  (162.6 cm)  BEHAVIORAL SYMPTOMS/MOOD NEUROLOGICAL BOWEL NUTRITION STATUS      Continent Diet(Heart Healthy)  AMBULATORY STATUS COMMUNICATION OF NEEDS Skin     Verbally Normal                       Personal Care Assistance Level of Assistance              Functional Limitations Info             SPECIAL CARE FACTORS FREQUENCY  PT (By licensed PT), OT (By licensed OT)     PT Frequency: 5 times per week OT Frequency: 5 times per week            Contractures Contractures Info: Not present    Additional Factors Info  Allergies, Code Status Code Status Info: full code Allergies Info: no Known Drug allergies           Current Medications (05/12/2019):  This is the current hospital active medication list Current Facility-Administered Medications  Medication Dose Route Frequency Provider Last Rate Last Admin  . 0.9  %  sodium chloride infusion   Intravenous Continuous Ivor Costa, MD 100 mL/hr at 05/12/19 0301 New Bag at 05/12/19 0301  . acetaminophen (TYLENOL) tablet 650 mg  650 mg Oral Q6H PRN Ivor Costa, MD      . amLODipine (NORVASC) tablet 10 mg  10 mg Oral Daily Ivor Costa, MD      . carvedilol (COREG) tablet 12.5 mg  12.5 mg Oral BID WC Ivor Costa, MD      . enoxaparin (LOVENOX) injection 40 mg  40 mg Subcutaneous Q24H Ivor Costa, MD   40 mg at 05/11/19 1706  . febuxostat (ULORIC) tablet 40 mg  40 mg Oral Daily Ivor Costa, MD      . hydrALAZINE (APRESOLINE) injection 5 mg  5 mg Intravenous Q2H PRN Ivor Costa, MD      . loratadine (CLARITIN) tablet 10 mg  10 mg Oral Daily Ivor Costa, MD      . morphine 2 MG/ML injection 2 mg  2 mg Intravenous Q4H PRN Ivor Costa, MD      . ondansetron [facility]) injection 4 mg  4 mg Intravenous Q8H PRN Ivor Costa, MD      . oxyCODONE-acetaminophen (PERCOCET/ROXICET) 5-325 MG per tablet 1 tablet  1 tablet Oral Q4H PRN Ivor Costa,  MD   1 tablet at 05/12/19 0627  . predniSONE (DELTASONE) tablet 20 mg  20 mg Oral Once Ivor Costa, MD      . senna-docusate (Senokot-S) tablet 1 tablet  1 tablet Oral QHS PRN Ivor Costa, MD         Discharge Medications: Please see discharge summary for a list of discharge medications.  Relevant Imaging Results:  Relevant Lab Results:   Additional Information SS# [id number]  Su Hilt, RN

## 2019-05-12 NOTE — TOC Progression Note (Signed)
Transition of Care [facility]) - Progression Note    Patient Details  Name: Michael Ramsey MRN: [id number] Date of Birth: [birth year]  Transition of Care [facility]) CM/SW [place], RN Phone Number: 05/12/2019, 3:20 PM  Clinical Narrative:    Met with the patient and discussed bed offers, he chose [facility]. I notified Tammy with [facility]  I called [place] to start [place] and faxed the clinical information to [phone]        Expected Discharge Plan and Services                                                 Social Determinants of Health (SDOH) Interventions    Readmission Risk Interventions No flowsheet data found.

## 2019-05-13 LAB — COMPREHENSIVE METABOLIC PANEL
ALT: 49 U/L — ABNORMAL HIGH (ref 0–44)
AST: 39 U/L (ref 15–41)
Albumin: 2.6 g/dL — ABNORMAL LOW (ref 3.5–5.0)
Alkaline Phosphatase: 80 U/L (ref 38–126)
Anion gap: 7 (ref 5–15)
BUN: 42 mg/dL — ABNORMAL HIGH (ref 8–23)
CO2: 19 mmol/L — ABNORMAL LOW (ref 22–32)
Calcium: 8.5 mg/dL — ABNORMAL LOW (ref 8.9–10.3)
Chloride: 110 mmol/L (ref 98–111)
Creatinine, Ser: 1.56 mg/dL — ABNORMAL HIGH (ref 0.61–1.24)
GFR calc Af Amer: 53 mL/min — ABNORMAL LOW (ref >60)
GFR calc non Af Amer: 46 mL/min — ABNORMAL LOW (ref >60)
Glucose, Bld: 100 mg/dL — ABNORMAL HIGH (ref 70–99)
Potassium: 4.7 mmol/L (ref 3.5–5.1)
Sodium: 136 mmol/L (ref 135–145)
Total Bilirubin: 0.7 mg/dL (ref 0.3–1.2)
Total Protein: 6.6 g/dL (ref 6.5–8.1)

## 2019-05-13 LAB — HIV ANTIBODY (ROUTINE TESTING W REFLEX): HIV Screen 4th Generation wRfx: NONREACTIVE

## 2019-05-13 LAB — CBC
HCT: 32 % — ABNORMAL LOW (ref 39.0–52.0)
Hemoglobin: 10.4 g/dL — ABNORMAL LOW (ref 13.0–17.0)
MCH: 28.1 pg (ref 26.0–34.0)
MCHC: 32.5 g/dL (ref 30.0–36.0)
MCV: 86.5 fL (ref 80.0–100.0)
Platelets: 431 10*3/uL — ABNORMAL HIGH (ref 150–400)
RBC: 3.7 MIL/uL — ABNORMAL LOW (ref 4.22–5.81)
RDW: 14.2 % (ref 11.5–15.5)
WBC: 14.6 10*3/uL — ABNORMAL HIGH (ref 4.0–10.5)
nRBC: 0 % (ref 0.0–0.2)

## 2019-05-13 LAB — MAGNESIUM: Magnesium: 2.2 mg/dL (ref 1.7–2.4)

## 2019-05-13 LAB — PHOSPHORUS: Phosphorus: 2.6 mg/dL (ref 2.5–4.6)

## 2019-05-13 MED ORDER — SODIUM BICARBONATE 650 MG PO TABS
650.0000 mg | ORAL_TABLET | Freq: Three times a day (TID) | ORAL | Status: AC
  Administered 2019-05-13 – 2019-05-15 (×6): 650 mg via ORAL
  Filled 2019-05-13 (×6): qty 1

## 2019-05-13 MED ORDER — COLCHICINE 0.6 MG PO TABS
0.6000 mg | ORAL_TABLET | Freq: Every day | ORAL | Status: DC
  Administered 2019-05-13 – 2019-05-20 (×8): 0.6 mg via ORAL
  Filled 2019-05-13 (×8): qty 1

## 2019-05-13 NOTE — Progress Notes (Signed)
PROGRESS NOTE  Michael Ramsey OZD:[id number] DOB: 1955/09/05 DOA: 05/11/2019 PCP: Michael Merles, MD  Brief History   Michael Ramsey is a 64 y.o. male with medical history significant of gout, HTN and CKD-IV, who presents with joint pain.  Patient states that she started having joint pain, including bilateral ankle, left wrist and left elbow.  Her pain is constant, moderate, sharp, nonradiating.  All this joints also use swelling, and mildly erythematous.  Patient does not have fever or chills.  No injury.  Patient states that this is typical for his gout flare. Pt is followed at [facility] clinic. He is now on febuxostat. Currently, pt is unable to walk due to severe pain in both ankles.  ED Course: pt was found to have uric acid 13.8, pending COVID-19 PCR, WBC 19.0, worsening renal function, temperature normal, blood pressure 176/101, heart rate 100, RR 24, oxygen saturation 98% on room air.  Patient is placed on MedSurg bed for observation.  The patient was admitted to a medical bed. He was given 50 mg of prednisone in the ED. He has been continued on 20 mg of prednisone daily. He states that he is unable to walk due to pain in his ankles.  Consultants  . None  Antibiotics   Anti-infectives (From admission, onward)   None     Subjective  The patient is resting comfortably. No new complaints.  Objective   Vitals:  Vitals:   05/12/19 2326 05/13/19 0801  BP: (!) 147/77 (!) 148/83  Pulse: 67 71  Resp: 17 16  Temp: 99 F (37.2 C) 98.8 F (37.1 C)  SpO2: 96% 99%    Exam:  Constitutional:  . The patient is awake, alert, and oriented x 3. No acute distress. Respiratory:  . No increased work of breathing. . No wheezes, rales, or rhonchi . No tactile fremitus Cardiovascular:  . Regular rate and rhythm . No murmurs, ectopy, or gallups. . No lateral PMI. No thrills. Abdomen:  . Abdomen is soft, non-tender, non-distended . No hernias, masses, or organomegaly . Normoactive  bowel sounds.  Musculoskeletal:  . No cyanosis, clubbing, or edema . Ankles bilaterally were erythematous, warm to touch, and swollen. They are tender to touch. Skin:  . No rashes, lesions, ulcers . palpation of skin: no induration or nodules Neurologic:  . CN 2-12 intact . Sensation all 4 extremities intact Psychiatric:  . Mental status o Mood, affect appropriate o Orientation to person, place, time  . judgment and insight appear intact  I have personally reviewed the following:   Today's Data  . Vitals, BMP, CBC  Micro Data  . Blood cultures x 2: No growth  Cardiology Data  . EKG  Scheduled Meds: . amLODipine  10 mg Oral Daily  . carvedilol  12.5 mg Oral BID WC  . colchicine  0.6 mg Oral Daily  . enoxaparin (LOVENOX) injection  40 mg Subcutaneous Q24H  . febuxostat  40 mg Oral Daily  . loratadine  10 mg Oral Daily   Continuous Infusions: . sodium chloride 100 mL/hr at 05/13/19 0745    Principal Problem:   Gout flare Active Problems:   CKD (chronic kidney disease), stage IV (HCC)   Hypertension   Leukocytosis   LOS: 1 day   A & P   Gout flare: Patient's joint pain in bilateral ankles, left wrist and the left elbow is most likely due to gout flare.  Patient states that this is typical symptoms of gout flare for  him.  Patient has leukocytosis, but no fever, currently low suspicions for septic joint.   Patient has stage IV CKD, and is not a good candidate for using colchicine or NSAID.  Patient received 50 mg of prednisone in ED, will continue 20 mg daily.  He will receive as needed morphine and Percocet for pain.  He will be continued on Uloric and he will be evaluated by PT/OT when he is able to participate.  CKD (chronic kidney disease), stage IV ([place]): Baseline creatinine 1.78 on 07/12/2015.   His creatinine was 2.06 and BUN 46, on admission. Creatinine is down to 1.56 today.   HTN: The patient has been continued on Coreg and Amlodipine. Amlodipine has  been increased from from 5 to 10 mg to better control blood pressures. Hydralazine is available on an as needed basis.  Leukocytosis: Likely reactive.  No fever.  Monitor.  I have seen and examined this patient myself. I have spent 32 minutes in his evaluation and care. DVT ppx: SQ Lovenox Code Status: Full code Family Communication: None available Disposition Plan:  The patient is from home. Anticipate discharge back to home. Barriers to discharge. The patient is unable to walk due to pain in ankles.  PT/OT eval recommended SNF placement Possible discharge in 1 to 2 days after pain control   Val Riles, MD [facility] Direct contact: see [email or web address]  7PM-7AM contact night coverage as above

## 2019-05-13 NOTE — Progress Notes (Signed)
Physical Therapy Treatment Patient Details Name: Michael Ramsey MRN: [id number] DOB: 1955/12/03 Today's Date: 05/13/2019    History of Present Illness Michael Ramsey is a 11 yoM here 2/2 inability to walk. Pt with history of gout flares, and he reports current joint pain (b/l LEs and L UE, especially ankles) is typical for these flares.    PT Comments    Pt in bed upon arrival, agreeable to participate. Pt unable to come to EOB without modA, very slow, guarded, weak in BUE. Pt able to sit at EOB and scoot forward, slowly, but with control, no seated balance loss. Several attempts to rise to standing with a few modifications to improve ergonomics, but ultimately pt is still unable to rise to standing. Pt has very limited ankle ROM bilat and poor recruitment of ankle muscles, seated ROM is only ~20 total degrees (from 40-20 degrees of ankle PF). This ankle rigidity limits pt's ability to bring feet under self for STS. Pt also has knee stiffness limitations Rt>Lt. Pt able to participate in seated exercises at EOB, no sittign balance issues. Will continue to follow.   Follow Up Recommendations  SNF     Equipment Recommendations       Recommendations for Other Services       Precautions / Restrictions Precautions Precautions: Fall Restrictions Weight Bearing Restrictions: No    Mobility  Bed Mobility Overal bed mobility: Needs Assistance Bed Mobility: Supine to Sit;Sit to Supine     Supine to sit: Mod assist Sit to supine: Mod assist   General bed mobility comments: very weak generally, has difficulty using arms to pull self into sitting  Transfers Overall transfer level: Needs assistance Equipment used: Rolling walker (2 wheeled) Transfers: Sit to/from Stand Sit to Stand: Total assist         General transfer comment: Attempted again, pt very weak, unable to fully rise despite heavy assist, pt unable to obtain adequate posturing of ankles and knees due to arthritic  stiffness, HS controll of knee flexion is limited.  Ambulation/Gait                 Stairs             Wheelchair Mobility    Modified Rankin (Stroke Patients Only)       Balance                                            Cognition Arousal/Alertness: Awake/alert Behavior During Therapy: WFL for tasks assessed/performed Overall Cognitive Status: Within Functional Limits for tasks assessed                                        Exercises Total Joint Exercises Ankle Circles/Pumps: AROM;10 reps;Limitations Ankle Circles/Pumps Limitations: not very successful, very rigid and weak appearing Long Arc Quad: AROM;Both;15 reps;Limitations Long [employer] Limitations: not attending to counting reps despite attempting to (bilat joint limitations (Lt~15 degrees, Rt ~25 degrees)) Marching in Standing: Seated;AROM;Both;10 reps    General Comments        Pertinent Vitals/Pain Pain Assessment: Faces Faces Pain Scale: Hurts little more Pain Location: b/l LEs (ankles and knees) Pain Descriptors / Indicators: Sore;Discomfort Pain Intervention(s): Limited activity within patient's tolerance;Monitored during session;Repositioned    Home Living  Prior Function            PT Goals (current goals can now be found in the care plan section) Acute Rehab PT Goals Patient Stated Goal: To get stronger and go home PT Goal Formulation: With patient Time For Goal Achievement: 05/26/19 Potential to Achieve Goals: Fair Progress towards PT goals: Progressing toward goals    Frequency    Min 2X/week      PT Plan Current plan remains appropriate    Co-evaluation              AM-PAC PT "6 Clicks" Mobility   Outcome Measure  Help needed turning from your back to your side while in a flat bed without using bedrails?: A Lot Help needed moving from lying on your back to sitting on the side of a flat bed  without using bedrails?: A Lot Help needed moving to and from a bed to a chair (including a wheelchair)?: Total Help needed standing up from a chair using your arms (e.g., wheelchair or bedside chair)?: Total Help needed to walk in hospital room?: Total Help needed climbing 3-5 steps with a railing? : Total 6 Click Score: 8    End of Session Equipment Utilized During Treatment: Gait belt Activity Tolerance: Patient limited by fatigue Patient left: with call bell/phone within reach;with bed alarm set;in bed Nurse Communication: Mobility status PT Visit Diagnosis: Muscle weakness (generalized) (M62.81);Difficulty in walking, not elsewhere classified (R26.2)     Time: 9741-6384 PT Time Calculation (min) (ACUTE ONLY): 16 min  Charges:  $Therapeutic Exercise: 8-22 mins                     12:58 PM, 05/13/19 Etta Grandchild, PT, DPT Physical Therapist - [facility]  [phone] ([place])    [place] C 05/13/2019, 12:55 PM

## 2019-05-13 NOTE — TOC Progression Note (Signed)
Transition of Care [facility]) - Progression Note    Patient Details  Name: Michael Ramsey MRN: [id number] Date of Birth: [birth year]  Transition of Care [facility]) CM/SW Contact  Su Hilt, RN Phone Number: 05/13/2019, 2:14 PM  Clinical Narrative:    [place] to inquire about the auth request, ref number [id number], it has been assigned but is still pending        Expected Discharge Plan and Services                                                 Social Determinants of Health (SDOH) Interventions    Readmission Risk Interventions No flowsheet data found.

## 2019-05-13 NOTE — TOC Progression Note (Signed)
Transition of Care [facility]) - Progression Note    Patient Details  Name: DWON SKY MRN: [id number] Date of Birth: 1955/01/06  Transition of Care [facility]) CM/SW Contact  Su Hilt, RN Phone Number: 05/13/2019, 10:04 AM  Clinical Narrative:    [facility] to inquire about insurance auth, Faxed additonal clinical notes to [phone]           Expected Discharge Plan and Services                                                 Social Determinants of Health (SDOH) Interventions    Readmission Risk Interventions No flowsheet data found.

## 2019-05-14 ENCOUNTER — Encounter: Payer: Self-pay | Admitting: Internal Medicine

## 2019-05-14 LAB — BASIC METABOLIC PANEL
Anion gap: 7 (ref 5–15)
BUN: 38 mg/dL — ABNORMAL HIGH (ref 8–23)
CO2: 21 mmol/L — ABNORMAL LOW (ref 22–32)
Calcium: 8.4 mg/dL — ABNORMAL LOW (ref 8.9–10.3)
Chloride: 109 mmol/L (ref 98–111)
Creatinine, Ser: 1.49 mg/dL — ABNORMAL HIGH (ref 0.61–1.24)
GFR calc Af Amer: 56 mL/min — ABNORMAL LOW (ref >60)
GFR calc non Af Amer: 49 mL/min — ABNORMAL LOW (ref >60)
Glucose, Bld: 112 mg/dL — ABNORMAL HIGH (ref 70–99)
Potassium: 4.6 mmol/L (ref 3.5–5.1)
Sodium: 137 mmol/L (ref 135–145)

## 2019-05-14 LAB — CBC
HCT: 35.5 % — ABNORMAL LOW (ref 39.0–52.0)
Hemoglobin: 11.1 g/dL — ABNORMAL LOW (ref 13.0–17.0)
MCH: 27.8 pg (ref 26.0–34.0)
MCHC: 31.3 g/dL (ref 30.0–36.0)
MCV: 89 fL (ref 80.0–100.0)
Platelets: 457 10*3/uL — ABNORMAL HIGH (ref 150–400)
RBC: 3.99 MIL/uL — ABNORMAL LOW (ref 4.22–5.81)
RDW: 14.2 % (ref 11.5–15.5)
WBC: 15.7 10*3/uL — ABNORMAL HIGH (ref 4.0–10.5)
nRBC: 0 % (ref 0.0–0.2)

## 2019-05-14 LAB — SARS CORONAVIRUS 2 BY RT PCR (HOSPITAL ORDER, PERFORMED IN ~~LOC~~ HOSPITAL LAB): SARS Coronavirus 2: NEGATIVE

## 2019-05-14 LAB — SARS CORONAVIRUS 2 (TAT 6-24 HRS): SARS Coronavirus 2: NEGATIVE

## 2019-05-14 MED ORDER — SODIUM BICARBONATE 650 MG PO TABS: 650.0000 mg | ORAL_TABLET | Freq: Three times a day (TID) | ORAL | 0 refills | Status: DC

## 2019-05-14 MED ORDER — AMLODIPINE BESYLATE 10 MG PO TABS: 10.0000 mg | ORAL_TABLET | Freq: Every day | ORAL | Status: AC

## 2019-05-14 MED ORDER — ACETAMINOPHEN 325 MG PO TABS: 650.0000 mg | ORAL_TABLET | Freq: Four times a day (QID) | ORAL | Status: AC | PRN

## 2019-05-14 MED ORDER — COLCHICINE 0.6 MG PO TABS: 0.6000 mg | ORAL_TABLET | Freq: Every day | ORAL | Status: AC

## 2019-05-14 MED ORDER — PIPERACILLIN-TAZOBACTAM 3.375 G IVPB 30 MIN
3.3750 g | Freq: Four times a day (QID) | INTRAVENOUS | Status: DC
  Filled 2019-05-14 (×4): qty 50

## 2019-05-14 MED ORDER — PIPERACILLIN-TAZOBACTAM 3.375 G IVPB
3.3750 g | Freq: Three times a day (TID) | INTRAVENOUS | Status: DC
  Administered 2019-05-14 – 2019-05-18 (×11): 3.375 g via INTRAVENOUS
  Filled 2019-05-14 (×8): qty 50

## 2019-05-14 NOTE — Progress Notes (Signed)
PROGRESS NOTE  YOUNG MULVEY UXL:[id number] DOB: 25-Jan-1955 DOA: 05/11/2019 PCP: Marguerita Merles, MD  Brief History   Michael Ramsey is a 64 y.o. male with medical history significant of gout, HTN and CKD-IV, who presents with joint pain.  Patient states that she started having joint pain, including bilateral ankle, left wrist and left elbow.  Her pain is constant, moderate, sharp, nonradiating.  All this joints also use swelling, and mildly erythematous.  Patient does not have fever or chills.  No injury.  Patient states that this is typical for his gout flare. Michael Ramsey is followed at [facility] clinic. He is now on febuxostat. Currently, Michael Ramsey is unable to walk due to severe pain in both ankles.  ED Course: Michael Ramsey was found to have uric acid 13.8, pending COVID-19 PCR, WBC 19.0, worsening renal function, temperature normal, blood pressure 176/101, heart rate 100, RR 24, oxygen saturation 98% on room air.  Patient is placed on MedSurg bed for observation.  The patient was admitted to a medical bed. He was given 50 mg of prednisone in the ED. He has been continued on 20 mg of prednisone daily. He states that he is unable to walk due to pain in his ankles.  Consultants  . None  Antibiotics   Anti-infectives (From admission, onward)   Start     Dose/Rate Route Frequency Ordered Stop   05/14/19 1800  piperacillin-tazobactam (ZOSYN) IVPB 3.375 g     3.375 g 100 mL/hr over 30 Minutes Intravenous Every 6 hours 05/14/19 1514       Subjective  The patient is resting comfortably. No new complaints.  Objective   Vitals:  Vitals:   05/14/19 0823 05/14/19 1514  BP: (!) 122/53 (!) 144/69  Pulse: 70 75  Resp: 20   Temp: 99.1 F (37.3 C) 99.9 F (37.7 C)  SpO2: 98% 98%    Exam:  Constitutional:  . The patient is awake, alert, and oriented x 3. No acute distress. Respiratory:  . No increased work of breathing. . No wheezes, rales, or rhonchi . No tactile fremitus Cardiovascular:  . Regular rate  and rhythm . No murmurs, ectopy, or gallups. . No lateral PMI. No thrills. Abdomen:  . Abdomen is soft, non-tender, non-distended . No hernias, masses, or organomegaly . Normoactive bowel sounds.  Musculoskeletal:  . No cyanosis, clubbing, or edema . Ankles bilaterally were erythematous, warm to touch, and swollen. They are tender to touch. Skin:  . No rashes, lesions, ulcers . palpation of skin: no induration or nodules Neurologic:  . CN 2-12 intact . Sensation all 4 extremities intact Psychiatric:  . Mental status o Mood, affect appropriate o Orientation to person, place, time  . judgment and insight appear intact  I have personally reviewed the following:   Today's Data  . Vitals, BMP, CBC  Micro Data  . Blood cultures x 2: No growth  Cardiology Data  . EKG  Scheduled Meds: . amLODipine  10 mg Oral Daily  . carvedilol  12.5 mg Oral BID WC  . colchicine  0.6 mg Oral Daily  . enoxaparin (LOVENOX) injection  40 mg Subcutaneous Q24H  . febuxostat  40 mg Oral Daily  . loratadine  10 mg Oral Daily  . sodium bicarbonate  650 mg Oral TID   Continuous Infusions: . sodium chloride 100 mL/hr at 05/14/19 1116  . piperacillin-tazobactam      Principal Problem:   Gout flare Active Problems:   CKD (chronic kidney disease), stage IV (  [place])   Hypertension   Leukocytosis   LOS: 2 days   A & P   Gout flare: Patient's joint pain in bilateral ankles, left wrist and the left elbow is most likely due to gout flare.  Patient states that this is typical symptoms of gout flare for him.  Patient has leukocytosis, but no fever, currently low suspicions for septic joint.   Patient has stage IV CKD, and is not a good candidate for using colchicine or NSAID.  Patient received 50 mg of prednisone in ED, will continue 20 mg daily.  He will receive as needed morphine and Percocet for pain.  He will be continued on Uloric and he will be evaluated by Michael Ramsey/OT when he is able to  participate. Patient is spiking fever off and on, so could not be discharged today as per EMS Started antibiotics empirically Zosyn IV every 6 hourly We will continue to monitor temperature curve and WBC count  CKD (chronic kidney disease), stage IV ([place]): Baseline creatinine 1.78 on 07/12/2015.   His creatinine was 2.06 and BUN 46, on admission. Creatinine is down to 1.56 today.   HTN: The patient has been continued on Coreg and Amlodipine. Amlodipine has been increased from from 5 to 10 mg to better control blood pressures. Hydralazine is available on an as needed basis.  Leukocytosis: Likely reactive.  No fever.  Monitor.  I have seen and examined this patient myself. I have spent 32 minutes in his evaluation and care. DVT ppx: SQ Lovenox Code Status: Full code Family Communication: None available Disposition Plan:  The patient is from home. Anticipate discharge home [facility]. Barriers to discharge. The patient is unable to walk due to pain in ankles.  Michael Ramsey/OT eval recommended SNF placement Patient spiked fever when EMS was there so discontinued discharge today. Started empirically antibiotics, we will follow temperature curve and WBC count.  Possible discharge tomorrow a.m.    Val Riles, MD [facility] Direct contact: see [email or web address]  7PM-7AM contact night coverage as above

## 2019-05-14 NOTE — Progress Notes (Signed)
Pt alert and oriented, percocet times one for ankle pain with relief, low grade fever at 100.0 with relief with tylenol prn, incontinent this shift. No respiratory distress noted. Continue on iv fluids at 100 mls/hr. Continue to monitor

## 2019-05-14 NOTE — TOC Transition Note (Signed)
Transition of Care [facility]) - CM/SW Discharge Note   Patient Details  Name: Michael Ramsey MRN: [id number] Date of Birth: May 11, 1955  Transition of Care [facility]) CM/SW Contact:  Su Hilt, RN Phone Number: 05/14/2019, 2:31 PM   Clinical Narrative:    RNCM called EMS for transport to [facility], bedside nurse is aware and called report, there are none ahead of him on the list   Final next level of care: [place] Barriers to Discharge: Barriers Resolved   Patient Goals and CMS Choice        Discharge Placement              Patient chooses bed at: [facility] [place] Patient to be transferred to facility by: EMS Name of family member notified: Velva Harman Patient and family notified of of transfer: 05/14/19  Discharge Plan and Services                                     Social Determinants of Health (SDOH) Interventions     Readmission Risk Interventions No flowsheet data found.

## 2019-05-14 NOTE — Discharge Summary (Signed)
[facility] Discharge Summary   Patient: Michael Ramsey [id number]:[id number]  PCP: Michael Merles, MD  Date of admission: 05/11/2019   Date of discharge:  05/14/2019     Discharge Diagnoses:   Principal Problem:   Gout flare Active Problems:   CKD (chronic kidney disease), stage IV ([place])   Hypertension   Leukocytosis   Admitted From: Home Disposition:  SNF   Recommendations for Outpatient Follow-up:  1. PCP: In 1 week for discharge, patient should be seen by physician at the facility in few days 2. Follow up LABS/TEST: Repeat CBC and BMP after 1 week   Contact information for follow-up providers    Michael Kluver., MD.   Specialty: Rheumatology Contact information: [place]  [id number] [phone]            Contact information for after-discharge care    Destination    [facility] SN[facility] Preferred SNF .   Service: Skilled Nursing Contact information: [address] [place] [phone]                 Diet recommendation: Cardiac diet  Activity: The patient is advised to gradually reintroduce usual activities, as tolerated  Discharge Condition: stable  Code Status: Full code   History of present illness: As per the H and P dictated on admission Hospital Course:  Michael Ramsey Michael Ramsey a 64 y.o.malewith medical history significant of gout, HTN and CKD-IV, who presents with joint pain. Patient states that she started having joint pain, including bilateral ankle, left wrist and left elbow. Her pain is constant, moderate, sharp, nonradiating. All this joints also use swelling, and mildly erythematous. Patient does not have fever or chills. No injury. Patient states that this is typical for his gout flare. Pt is followed at [facility] clinic. He is now on febuxostat. Currently, pt is unable to walk due to severe pain in both ankles.  ED Course:pt was found to have  uric acid 13.8, pending COVID-19 PCR, WBC 19.0, worsening renal function, temperature normal, blood pressure 176/101, heart rate 100, RR 24, oxygen saturation 98% on room air. Patient is placed on MedSurg bed for observation.  The patient was admitted to a medical bed. He was given 50 mg of prednisone in the ED. He has been continued on 20 mg of prednisone daily. He states that he is unable to walk due to pain in his ankles.  Assessment and plan Principal problem Gout flareup Active problems CKD Hypertension,  leukocytosis   # Gout flare:Patient's joint pain in bilateral ankles, left wrist and the left elbow is most likely due to gout flare. Patient states that this is typical symptoms of gout flare for him. Patient has leukocytosis, but no fever, currently low suspicions for septic joint.  Patient AKI on CKD stage III so colchicine and NSAIDs were not used initially.  Patient received 50 mg of prednisone in ED, followed by 20 mg daily. Pt got as needed morphine and Percocet for pain as well. continued Uloric, started colchicine, continue for 2 weeks.  Renal functions are improving.  Use Tylenol as needed.  PT OT is eval done, recommended SNF placement.  Continue physical therapy.   # AKI on CKD (chronic kidney disease), stage III ([place]): Baseline creatinine 1.78 on 07/12/2015.  His creatinine was 2.06 and BUN 46, on admission. Creatinine is down to 1.49 today.   # HTN: The patient has been continued on Coreg and Amlodipine.  Amlodipine has been increased from from 5 to 10 mg to better control blood pressures.  To new to monitor blood pressure and titrate medications accordingly.  Leukocytosis:Likely reactive. No fever.    Repeat CBC and BMP after 1 week  Body mass index is 30.04 kg/m.  Nutrition Interventions:     No opiates for pain control prescribed.    - [place] Controlled Substance Reporting System database was not reviewed. - Patient was instructed, not to drive,  operate heavy machinery, perform activities at heights, swimming or participation in water activities or provide baby sitting services while on Pain, Sleep and Anxiety Medications; until his outpatient Physician has advised to do so again.  - Also recommended to not to take more than prescribed Pain, Sleep and Anxiety Medications.  Patient was ambulatory with assistance, eval done by PT/OT Patient was seen by physical therapy, who recommended SNF, which was arranged. On the day of the discharge the patient's vitals were stable, and no other acute medical condition were reported by patient. the patient was felt safe to be discharge at SNF   Consultants: None Procedures: None  Discharge Exam: General: Appear in no distress, no Rash; Oral Mucosa Clear, moist. Cardiovascular: S1 and S2 Present, no Murmur, Respiratory: normal respiratory effort, Bilateral Air entry present and no Crackles, no wheezes Abdomen: Bowel Sound present, Soft and no tenderness, no hernia Extremities: no Pedal edema, no calf tenderness, left wrist and elbow tenderness and bilateral knee tenderness but no signs of erythema or infection. Neurology: alert and oriented to time, place, and person affect appropriate.  Filed Weights   05/11/19 0344  Weight: 79.4 kg   Vitals:   05/14/19 0110 05/14/19 0823  BP:  (!) 122/53  Pulse:  70  Resp:  20  Temp: 98.6 F (37 C) 99.1 F (37.3 C)  SpO2:  98%    DISCHARGE MEDICATION: Allergies as of 05/14/2019   No Known Allergies     Medication List    TAKE these medications   acetaminophen 325 MG tablet Commonly known as: TYLENOL Take 2 tablets (650 mg total) by mouth every 6 (six) hours as needed for mild pain or fever.   amLODipine 10 MG tablet Commonly known as: NORVASC Take 1 tablet (10 mg total) by mouth daily. What changed:   medication strength  how much to take   carvedilol 12.5 MG tablet Commonly known as: COREG Take 12.5 mg by mouth 2 (two) times daily  with a meal.   cetirizine 10 MG tablet Commonly known as: ZYRTEC Take 10 mg by mouth daily.   colchicine 0.6 MG tablet Take 1 tablet (0.6 mg total) by mouth daily for 12 days. Start taking on: May 15, 2019   febuxostat 40 MG tablet Commonly known as: ULORIC Take 40 mg by mouth in the morning and at bedtime.   sodium bicarbonate 650 MG tablet Take 1 tablet (650 mg total) by mouth 3 (three) times daily for 7 days.      No Known Allergies Discharge Instructions    Call MD for:  severe uncontrolled pain   Complete by: As directed    Call MD for:  temperature >100.4   Complete by: As directed    Diet - low sodium heart healthy   Complete by: As directed    Discharge instructions   Complete by: As directed    Patient should be seen by PCP in few days, continue colchicine for 2 weeks and Tylenol as needed.  Continue bicarbonate  for 1 week.  Monitor blood pressure and titrate medications accordingly, increase amlodipine from 5 to 10 mg.   Increase activity slowly   Complete by: As directed       The results of significant diagnostics from this hospitalization (including imaging, microbiology, ancillary and laboratory) are listed below for reference.    Significant Diagnostic Studies: No results found.  Microbiology: Recent Results (from the past 240 hour(s))  SARS Coronavirus 2 by RT PCR (hospital order, performed in [facility] hospital lab) Nasopharyngeal Nasopharyngeal Swab     Status: None   Collection Time: 05/11/19 11:31 AM   Specimen: Nasopharyngeal Swab  Result Value Ref Range Status   SARS Coronavirus 2 NEGATIVE NEGATIVE Final    Comment: (NOTE) SARS-CoV-2 target nucleic acids are NOT DETECTED. The SARS-CoV-2 RNA is generally detectable in upper and lower respiratory specimens during the acute phase of infection. The lowest concentration of SARS-CoV-2 viral copies this assay can detect is 250 copies / mL. A negative result does not preclude SARS-CoV-2  infection and should not be used as the sole basis for treatment or other patient management decisions.  A negative result may occur with improper specimen collection / handling, submission of specimen other than nasopharyngeal swab, presence of viral mutation(s) within the areas targeted by this assay, and inadequate number of viral copies (<250 copies / mL). A negative result must be combined with clinical observations, patient history, and epidemiological information. Fact Sheet for Patients:   StrictlyIdeas.no Fact Sheet for Healthcare Providers: BankingDealers.co.za This test is not yet approved or cleared  by the [place] FDA and has been authorized for detection and/or diagnosis of SARS-CoV-2 by FDA under an Emergency Use Authorization (EUA).  This EUA will remain in effect (meaning this test can be used) for the duration of the COVID-19 declaration under Section 564(b)(1) of the Act, 21 U.S.C. section 360bbb-3(b)(1), unless the authorization is terminated or revoked sooner. Performed at [facility], [address] [place], [place] [address]   CULTURE, BLOOD (ROUTINE X 2) w Reflex to ID Panel     Status: None (Preliminary result)   Collection Time: 05/11/19  5:03 PM   Specimen: BLOOD  Result Value Ref Range Status   Specimen Description BLOOD LEFT ANTECUBITAL  Final   Special Requests   Final    BOTTLES DRAWN AEROBIC AND ANAEROBIC Blood Culture adequate volume   Culture   Final    NO GROWTH 3 DAYS Performed at [facility], [address] [place], [place] [address]    Report Status PENDING  Incomplete  CULTURE, BLOOD (ROUTINE X 2) w Reflex to ID Panel     Status: None (Preliminary result)   Collection Time: 05/11/19  6:30 PM   Specimen: BLOOD  Result Value Ref Range Status   Specimen Description BLOOD RIGHT ANTECUBITAL  Final   Special Requests   Final    BOTTLES DRAWN AEROBIC AND ANAEROBIC Blood  Culture adequate volume   Culture   Final    NO GROWTH 3 DAYS Performed at [facility], [place], [place] [address]    Report Status PENDING  Incomplete     Labs: CBC: Recent Labs  Lab 05/11/19 0347 05/12/19 0342 05/13/19 0431 05/14/19 0423  WBC 19.0* 17.1* 14.6* 15.7*  NEUTROABS 16.2*  --   --   --   HGB 13.4 10.8* 10.4* 11.1*  HCT 41.9 34.7* 32.0* 35.5*  MCV 87.5 87.4 86.5 89.0  PLT 575* 440* 431* 457*  Basic Metabolic Panel: Recent Labs  Lab 05/11/19 0347 05/12/19 0342 05/13/19 0431 05/14/19 0423  NA 136 135 136 137  K 4.2 4.3 4.7 4.6  CL 103 108 110 109  CO2 19* 19* 19* 21*  GLUCOSE 130* 122* 100* 112*  BUN 46* 54* 42* 38*  CREATININE 2.06* 1.98* 1.56* 1.49*  CALCIUM 9.4 8.6* 8.5* 8.4*  MG  --   --  2.2  --   PHOS  --   --  2.6  --    Liver Function Tests: Recent Labs  Lab 05/11/19 0347 05/13/19 0431  AST 31 39  ALT 41 49*  ALKPHOS 120 80  BILITOT 1.0 0.7  PROT 8.3* 6.6  ALBUMIN 3.5 2.6*   No results for input(s): LIPASE, AMYLASE in the last 168 hours. No results for input(s): AMMONIA in the last 168 hours. Cardiac Enzymes: No results for input(s): CKTOTAL, CKMB, CKMBINDEX, TROPONINI in the last 168 hours. BNP (last 3 results) No results for input(s): BNP in the last 8760 hours. CBG: No results for input(s): GLUCAP in the last 168 hours.  Time spent: 35 minutes  Signed:  Val Riles  [facility]  05/14/2019 12:23 PM

## 2019-05-14 NOTE — TOC Progression Note (Signed)
Transition of Care [facility]) - Progression Note    Patient Details  Name: Michael Ramsey MRN: [id number] Date of Birth: 1955-10-24  Transition of Care [facility]) CM/SW Contact  Su Hilt, RN Phone Number: 05/14/2019, 9:02 AM  Clinical Narrative:    Received a call from [facility], Insurance approved next review date 5/15 Ref number [id number] Josem Kaufmann ID [id number] [place] Notified Tammy at [facility] and Notified the physician         Expected Discharge Plan and Services                                                 Social Determinants of Health (SDOH) Interventions    Readmission Risk Interventions No flowsheet data found.

## 2019-05-14 NOTE — Progress Notes (Signed)
Pharmacy Communication to Provider  Concerning: Zosyn  HPI: Patient is a 64 y/o M with medical history as below admitted 5/10 with gout flare. He was to be discharged today but having intermittent fevers and thus unable to go home. He was started on empiric coverage with Zosyn.  Past Medical History:  Diagnosis Date  . CKD (chronic kidney disease), stage IV ([place])   . Gout   . Hypertension    Anti-infectives (From admission, onward)   Start     Dose/Rate Route Frequency Ordered Stop   05/14/19 1800  piperacillin-tazobactam (ZOSYN) IVPB 3.375 g  Status:  Discontinued     3.375 g 100 mL/hr over 30 Minutes Intravenous Every 6 hours 05/14/19 1514 05/14/19 1607   05/14/19 1800  piperacillin-tazobactam (ZOSYN) IVPB 3.375 g     3.375 g 12.5 mL/hr over 240 Minutes Intravenous Every 8 hours 05/14/19 1607       Estimated Creatinine Clearance: 47 mL/min (A) (by C-G formula based on SCr of 1.49 mg/dL (H)).  Plan:  Per [place] Antimicrobial Dosing Guidelines, will adjust Zosyn to 3.375 g q8h extended infusion over 4 hours for patient with CrCl >20 mL/min  [place] Resident 14 May 2019

## 2019-05-14 NOTE — TOC Transition Note (Signed)
Transition of Care [facility]) - CM/SW Discharge Note   Patient Details  Name: Michael Ramsey MRN: [id number] Date of Birth: 1955/11/04  [facility]) CM/SW Contact:  Su Hilt, RN Phone Number: 05/14/2019, 2:09 PM   Clinical Narrative:     The patient is to DC to [facility] today via EMS for transport, I called his sister Velva Harman and notified her of the patient discharging to [facility], she said that she was so pleased with the care that the patient got and with his bedside nurse.  The bedside nurse is aware of the DC and that the packet is on the chart. RNCM will call EMS when the bedside nurse is ready  Final next level of care: Skilled Nursing Facility Barriers to Discharge: Barriers Resolved   Patient Goals and CMS Choice        Discharge Placement              Patient chooses bed at: [facility] [place] Patient to be transferred to facility by: EMS Name of family member notified: Velva Harman Patient and family notified of of transfer: 05/14/19  Discharge Plan and Services                                     Social Determinants of Health (SDOH) Interventions     Readmission Risk Interventions No flowsheet data found.

## 2019-05-15 ENCOUNTER — Inpatient Hospital Stay: Payer: Medicare Other

## 2019-05-15 LAB — PROCALCITONIN: Procalcitonin: 0.73 ng/mL

## 2019-05-15 LAB — BASIC METABOLIC PANEL
Anion gap: 8 (ref 5–15)
BUN: 33 mg/dL — ABNORMAL HIGH (ref 8–23)
CO2: 22 mmol/L (ref 22–32)
Calcium: 8.2 mg/dL — ABNORMAL LOW (ref 8.9–10.3)
Chloride: 108 mmol/L (ref 98–111)
Creatinine, Ser: 1.52 mg/dL — ABNORMAL HIGH (ref 0.61–1.24)
GFR calc Af Amer: 55 mL/min — ABNORMAL LOW (ref >60)
GFR calc non Af Amer: 47 mL/min — ABNORMAL LOW (ref >60)
Glucose, Bld: 103 mg/dL — ABNORMAL HIGH (ref 70–99)
Potassium: 4.2 mmol/L (ref 3.5–5.1)
Sodium: 138 mmol/L (ref 135–145)

## 2019-05-15 LAB — CBC
HCT: 33.6 % — ABNORMAL LOW (ref 39.0–52.0)
Hemoglobin: 10.5 g/dL — ABNORMAL LOW (ref 13.0–17.0)
MCH: 27.9 pg (ref 26.0–34.0)
MCHC: 31.3 g/dL (ref 30.0–36.0)
MCV: 89.1 fL (ref 80.0–100.0)
Platelets: 480 10*3/uL — ABNORMAL HIGH (ref 150–400)
RBC: 3.77 MIL/uL — ABNORMAL LOW (ref 4.22–5.81)
RDW: 14.3 % (ref 11.5–15.5)
WBC: 17.5 10*3/uL — ABNORMAL HIGH (ref 4.0–10.5)
nRBC: 0 % (ref 0.0–0.2)

## 2019-05-15 MED ORDER — SODIUM CHLORIDE 0.9 % IV SOLN
INTRAVENOUS | Status: DC | PRN
  Administered 2019-05-15 – 2019-05-16 (×2): 250 mL via INTRAVENOUS

## 2019-05-15 NOTE — Progress Notes (Signed)
PROGRESS NOTE  Michael Ramsey DOB: [birth year] DOA: 05/11/2019 PCP: Marguerita Merles, MD  Brief History   Michael Ramsey is a 64 y.o. male with medical history significant of gout, HTN and CKD-IV, who presents with joint pain.  Patient states that she started having joint pain, including bilateral ankle, left wrist and left elbow.  Her pain is constant, moderate, sharp, nonradiating.  All this joints also use swelling, and mildly erythematous.  Patient does not have fever or chills.  No injury.  Patient states that this is typical for his gout flare. Pt is followed at [facility]. He is now on febuxostat. Currently, pt is unable to walk due to severe pain in both ankles.  ED Course: pt was found to have uric acid 13.8, pending COVID-19 PCR, WBC 19.0, worsening renal function, temperature normal, blood pressure 176/101, heart rate 100, RR 24, oxygen saturation 98% on room air.  Patient is placed on MedSurg bed for observation.  The patient was admitted to a medical bed. He was given 50 mg of prednisone in the ED. He has been continued on 20 mg of prednisone daily. He states that he is unable to walk due to pain in his ankles.  Consultants  . None  Antibiotics   Anti-infectives (From admission, onward)   Start     Dose/Rate Route Frequency Ordered Stop   05/14/19 1800  piperacillin-tazobactam (ZOSYN) IVPB 3.375 g  Status:  Discontinued     3.375 g 100 mL/hr over 30 Minutes Intravenous Every 6 hours 05/14/19 1514 05/14/19 1607   05/14/19 1800  piperacillin-tazobactam (ZOSYN) IVPB 3.375 g     3.375 g 12.5 mL/hr over 240 Minutes Intravenous Every 8 hours 05/14/19 1607       Subjective  The patient is resting comfortably. No new complaints.  Objective   Vitals:  Vitals:   05/14/19 2313 05/15/19 0801  BP: (!) 148/81 (!) 157/66  Pulse: 75 71  Resp: 16 15  Temp: 100 F (37.8 C) 100 F (37.8 C)  SpO2: 98% 98%    Exam:  Constitutional:  . The patient is awake,  alert, and oriented x 3. No acute distress. Respiratory:  . No increased work of breathing. . No wheezes, rales, or rhonchi . No tactile fremitus Cardiovascular:  . Regular rate and rhythm . No murmurs, ectopy, or gallups. . No lateral PMI. No thrills. Abdomen:  . Abdomen is soft, non-tender, non-distended . No hernias, masses, or organomegaly . Normoactive bowel sounds.  Musculoskeletal:  . No cyanosis, clubbing, or edema . Ankles bilaterally were erythematous, warm to touch, and swollen. They are tender to touch. Skin:  . No rashes, lesions, ulcers . palpation of skin: no induration or nodules Neurologic:  . CN 2-12 intact . Sensation all 4 extremities intact Psychiatric:  . Mental status o Mood, affect appropriate o Orientation to person, place, time  . judgment and insight appear intact  I have personally reviewed the following:   Today's Data  . Vitals, BMP, CBC  Micro Data  . Blood cultures x 2: No growth  Cardiology Data  . EKG  Scheduled Meds: . amLODipine  10 mg Oral Daily  . carvedilol  12.5 mg Oral BID WC  . colchicine  0.6 mg Oral Daily  . enoxaparin (LOVENOX) injection  40 mg Subcutaneous Q24H  . febuxostat  40 mg Oral Daily  . loratadine  10 mg Oral Daily   Continuous Infusions: . sodium chloride 100 mL/hr at 05/15/19 1101  .  sodium chloride Stopped (05/15/19 0545)  . piperacillin-tazobactam (ZOSYN)  IV 12.5 mL/hr at 05/15/19 4098    Principal Problem:   Gout flare Active Problems:   CKD (chronic kidney disease), stage IV (HCC)   Hypertension   Leukocytosis   LOS: 3 days   A & P   Gout flare: Patient's joint pain in bilateral ankles, left wrist and the left elbow is most likely due to gout flare.  Patient states that this is typical symptoms of gout flare for him.  Patient has leukocytosis, but no fever, currently low suspicions for septic joint.   Patient has stage IV CKD, and is not a good candidate for using colchicine or NSAID.    Patient received 50 mg of prednisone in ED, will continue 20 mg daily.  He will receive as needed morphine and Percocet for pain.  He will be continued on Uloric and he will be evaluated by PT/OT when he is able to participate. Patient is spiking fever off and on, so could not be discharged today as per EMS Started antibiotics empirically Zosyn IV every 6 hourly We will continue to monitor temperature curve and WBC count  CKD (chronic kidney disease), stage IV ([employer]): Baseline creatinine 1.78 on 07/12/2015.   His creatinine was 2.06 and BUN 46, on admission. Creatinine is down to 1.56 today.   HTN: The patient has been continued on Coreg and Amlodipine. Amlodipine has been increased from from 5 to 10 mg to better control blood pressures. Hydralazine is available on an as needed basis.  Leukocytosis: Likely reactive due to inflammation secondary to gout. Wbc 17.5 ESR 9,  procal 0.73    Low-grade fever around 100F, possible due to joint inflammation from gout 5/13 empirically started Zosyn Procalcitonin negative X-ray left wrist negative for any fluid collection    I have seen and examined this patient myself. I have spent 32 minutes in his evaluation and care. DVT ppx: SQ Lovenox Code Status: Full code Family Communication: None available Disposition Plan:  The patient is from home. Anticipate discharge home NSF. Barriers to discharge. The patient is unable to walk due to pain in ankles.  PT/OT eval recommended SNF placement Patient spiked fever 100 F in am and needs to be fever free for 24 hrs 5/13 Started empirically antibiotics,  we will follow temperature curve and WBC count.  Possible discharge tomorrow a.m.    Val Riles, MD [facility] Direct contact: see [email or web address]  7PM-7AM contact night coverage as above

## 2019-05-15 NOTE — Progress Notes (Signed)
Physical Therapy Treatment Patient Details Name: Michael Ramsey MRN: [id number] DOB: 02-Dec-1955 Today's Date: 05/15/2019    History of Present Illness Kyandre Okray is a 55 yoM here 2/2 inability to walk. Pt with history of gout flares, and he reports current joint pain (b/l LEs and L UE, especially ankles) is typical for these flares.    PT Comments    On arrival pt reports he is feeling better today and despite continued LE joint pain he was eager to work on mobility and try and get to standing.  Pt highly motivated to get himself to sitting EOB w/o assist (c/o significant pain with any palpation of LEs) but despite great effort, PT putting HOB up to ~65* and plenty of extra time he still needed some assist to get to upright sitting, especially with scooting transition to square with EOB.  He showed good attempt with trying to get to standing but ultimately (even with raised bed and phyiscal assist to get LEs in position) he did not have the ROM nor pain tolerance to effectively assist with getting hips off bed.  What movement that did happen was total assist.  Pt continues to have very poor ROM in knees/ankles, not close to TKE on either side and L knee flexion grossly 60, R <50.  Follow Up Recommendations  SNF     Equipment Recommendations  None recommended by PT    Recommendations for Other Services       Precautions / Restrictions Precautions Precautions: Fall Restrictions Weight Bearing Restrictions: No    Mobility  Bed Mobility Overal bed mobility: Needs Assistance Bed Mobility: Supine to Sit;Sit to Supine     Supine to sit: Min assist Sit to supine: Min assist   General bed mobility comments: Pt very motivated to try to get to sitting on his own, despite plenty of extra time/cuing, raising HOB to >60 and much encouragement he ultimately still needd min assist to attain sitting, unable to bridge hips/scoot in bed to get back to appropriately positioned  supine  Transfers Overall transfer level: Needs assistance Equipment used: Rolling walker (2 wheeled) Transfers: Sit to/from Stand Sit to Stand: Total assist         General transfer comment: Pt eager to try and see what he can do... continues to be very limited with inability to even assume appropriate position (limited knee and ankle available ROM) and despite elevating bed and heavy assist to try and transition we would not attain upright.  When PT asked him if he wanted to try again he said "no" and reports too much pain and just being too weak still  Ambulation/Gait             General Gait Details: Unable/unsafe to attempt   Stairs             Wheelchair Mobility    Modified Rankin (Stroke Patients Only)       Balance Overall balance assessment: Needs assistance   Sitting balance-Leahy Scale: Fair Sitting balance - Comments: once assisted to square with EOB he was able to maintain sitting balacne     Standing balance-Leahy Scale: Zero Standing balance comment: Pt unable to effectively use UEs/AD to maintain standing, poor tolerance                            Cognition Arousal/Alertness: Awake/alert Behavior During Therapy: WFL for tasks assessed/performed Overall Cognitive Status: Within Functional Limits for tasks assessed  Exercises General Exercises - Lower Extremity Ankle Circles/Pumps: PROM;5 reps;Both(very limited and painful ROM ) Heel Slides: 5 reps;AROM(v limited knee ROM b/l, R>L) Hip ABduction/ADduction: Strengthening;10 reps;Both    General Comments        Pertinent Vitals/Pain Pain Assessment: 0-10 Pain Score: 8  Pain Location: b/l LEs (ankles and knees)    Home Living                      Prior Function            PT Goals (current goals can now be found in the care plan section) Progress towards PT goals: Progressing toward goals     Frequency    Min 2X/week      PT Plan Current plan remains appropriate    Co-evaluation              AM-PAC PT "6 Clicks" Mobility   Outcome Measure  Help needed turning from your back to your side while in a flat bed without using bedrails?: A Little Help needed moving from lying on your back to sitting on the side of a flat bed without using bedrails?: A Lot Help needed moving to and from a bed to a chair (including a wheelchair)?: Total Help needed standing up from a chair using your arms (e.g., wheelchair or bedside chair)?: Total Help needed to walk in hospital room?: Total Help needed climbing 3-5 steps with a railing? : Total 6 Click Score: 9    End of Session Equipment Utilized During Treatment: Gait belt Activity Tolerance: Patient limited by pain;Patient limited by fatigue Patient left: with call bell/phone within reach;with bed alarm set;in bed   PT Visit Diagnosis: Muscle weakness (generalized) (M62.81);Difficulty in walking, not elsewhere classified (R26.2)     Time: 7253-6644 PT Time Calculation (min) (ACUTE ONLY): 25 min  Charges:  $Therapeutic Exercise: 8-22 mins $Therapeutic Activity: 8-22 mins                     Kreg Shropshire, DPT 05/15/2019, 1:33 PM

## 2019-05-15 NOTE — Care Management Important Message (Signed)
Important Message  Patient Details  Name: Michael Ramsey MRN: [id number] Date of Birth: 1955-11-10   Medicare Important Message Given:  Yes     Juliann Pulse A Dempsey Ahonen 05/15/2019, 11:45 AM

## 2019-05-15 NOTE — TOC Progression Note (Signed)
Transition of Care [facility]) - Progression Note    Patient Details  Name: Michael Ramsey MRN: [id number] Date of Birth: 10-May-1955  Transition of [facility] CM/SW [place], RN Phone Number: 05/15/2019, 12:00 PM  Clinical Narrative:     Patient did not DC yesterday due to spiking fever, patient still have a fever this morning, will need to be fever free for 24 hours prior to dc    Barriers to Discharge: Barriers Resolved  Expected Discharge Plan and Services           Expected Discharge Date: 05/14/19                                     Social Determinants of Health (SDOH) Interventions    Readmission Risk Interventions No flowsheet data found.

## 2019-05-16 ENCOUNTER — Inpatient Hospital Stay: Payer: Medicare Other

## 2019-05-16 DIAGNOSIS — M7989 Other specified soft tissue disorders: Secondary | ICD-10-CM

## 2019-05-16 DIAGNOSIS — M79645 Pain in left finger(s): Secondary | ICD-10-CM

## 2019-05-16 DIAGNOSIS — M25579 Pain in unspecified ankle and joints of unspecified foot: Secondary | ICD-10-CM | POA: Diagnosis present

## 2019-05-16 DIAGNOSIS — M25432 Effusion, left wrist: Secondary | ICD-10-CM

## 2019-05-16 DIAGNOSIS — R509 Fever, unspecified: Secondary | ICD-10-CM

## 2019-05-16 DIAGNOSIS — M25572 Pain in left ankle and joints of left foot: Secondary | ICD-10-CM

## 2019-05-16 DIAGNOSIS — M25571 Pain in right ankle and joints of right foot: Secondary | ICD-10-CM

## 2019-05-16 DIAGNOSIS — M25532 Pain in left wrist: Secondary | ICD-10-CM | POA: Diagnosis present

## 2019-05-16 LAB — CULTURE, BLOOD (ROUTINE X 2)
Culture: NO GROWTH
Culture: NO GROWTH
Special Requests: ADEQUATE
Special Requests: ADEQUATE

## 2019-05-16 LAB — BASIC METABOLIC PANEL
Anion gap: 8 (ref 5–15)
BUN: 29 mg/dL — ABNORMAL HIGH (ref 8–23)
CO2: 22 mmol/L (ref 22–32)
Calcium: 8.3 mg/dL — ABNORMAL LOW (ref 8.9–10.3)
Chloride: 107 mmol/L (ref 98–111)
Creatinine, Ser: 1.41 mg/dL — ABNORMAL HIGH (ref 0.61–1.24)
GFR calc Af Amer: >60 mL/min (ref >60)
GFR calc non Af Amer: 52 mL/min — ABNORMAL LOW (ref >60)
Glucose, Bld: 96 mg/dL (ref 70–99)
Potassium: 4.4 mmol/L (ref 3.5–5.1)
Sodium: 137 mmol/L (ref 135–145)

## 2019-05-16 LAB — CBC
HCT: 35.2 % — ABNORMAL LOW (ref 39.0–52.0)
Hemoglobin: 11 g/dL — ABNORMAL LOW (ref 13.0–17.0)
MCH: 27.8 pg (ref 26.0–34.0)
MCHC: 31.3 g/dL (ref 30.0–36.0)
MCV: 88.9 fL (ref 80.0–100.0)
Platelets: 510 10*3/uL — ABNORMAL HIGH (ref 150–400)
RBC: 3.96 MIL/uL — ABNORMAL LOW (ref 4.22–5.81)
RDW: 14.1 % (ref 11.5–15.5)
WBC: 16.4 10*3/uL — ABNORMAL HIGH (ref 4.0–10.5)
nRBC: 0 % (ref 0.0–0.2)

## 2019-05-16 MED ORDER — VANCOMYCIN HCL IN DEXTROSE 1-5 GM/200ML-% IV SOLN
1000.0000 mg | Freq: Once | INTRAVENOUS | Status: AC
  Administered 2019-05-16: 1000 mg via INTRAVENOUS
  Filled 2019-05-16: qty 200

## 2019-05-16 MED ORDER — VANCOMYCIN HCL IN DEXTROSE 1-5 GM/200ML-% IV SOLN
1000.0000 mg | INTRAVENOUS | Status: DC
  Filled 2019-05-16: qty 200

## 2019-05-16 NOTE — Progress Notes (Signed)
[facility]  PROGRESS NOTE  Michael Ramsey SJG:[id number] DOB: [birth year] DOA: 05/11/2019 PCP: Marguerita Merles, MD Admit date - 05/11/2019   Admitting Physician Karie Kirks, DO  Outpatient Primary MD for the patient is Marguerita Merles, MD  LOS - 4 Brief Narrative   Michael Ramsey is a 64 y.o. year old male with medical history significant for gout, HTN and CKD-IV who presented on 05/11/2019 with bilateral ankle pain left wrist and left elbow pain consistent with previous gout flares.  Patient was found to have uric acid of 10.8, With white count of 19 afebrile, with CRP of 19.5.  He was given 50 mg of prednisone in the ED and continued on prednisone due to significant ankle pain limiting his ability to walk. Hospital course complicated by persistent joint pain requiring addition of colchicine to his home Uloric as well as IV Zosyn for intermittent fevers during hospital stay.  Subjective  Today still has joint pain in bilateral wrists, ankles, and noticed worsening swelling in left ring finger with some noted drainage overnight  A & P   Diffuse joint pain (bilateral ankles, wrists, left ring finger), concerning for a gout flare, also considering potential infectious arthritis particularly at left ring finger given noted erythema and reported drainage, xr and physical exam not consistent with abscess.  Last fever was overnight leukocytosis still elevated at 16.4 with procalcitonin of 0.73, x-ray of left finger show soft tissue swelling. -Add vancomycin for MRSA coverage -Continue empiric Zosyn -Discussed case with orthopedics consultants, given location of left ring finger low likelihood of potential aspiration will monitor on antibiotics. -Continue Norco, colchicine   Leukocytosis with intermittent fevers, some concern for potential infection.  Blood cultures unremarkable, most likely source potentially being left ring finger given soft tissue swelling noted on x-ray. -Add IV vancomycin to  IV Zosyn as noted above -Continue to closely monitor  CKD stage IV, stable.  Peak creatinine of 2.06 during hospital stay, back to baseline. -Closely monitor BMP while on colchicine  HTN, stable -Continue Coreg, amlodipine    Family Communication  : None at bedside  Code Status : Full  Disposition Plan  :  Patient is from home. Anticipated d/c date: 2 to 3 days. Barriers to d/c or necessity for inpatient status:  IV antibiotics for ruling out potential infectious arthritis, continue pain control for potential gout flare Consults  : Orthopedics  Procedures  : None  DVT Prophylaxis  :  Lovenox   Lab Results  Component Value Date   PLT 510 (H) 05/16/2019    Diet :  Diet Order            Diet Heart Room service appropriate? Yes; Fluid consistency: Thin  Diet effective now        Diet - low sodium heart healthy               Inpatient Medications Scheduled Meds: . amLODipine  10 mg Oral Daily  . carvedilol  12.5 mg Oral BID WC  . colchicine  0.6 mg Oral Daily  . enoxaparin (LOVENOX) injection  40 mg Subcutaneous Q24H  . febuxostat  40 mg Oral Daily  . loratadine  10 mg Oral Daily   Continuous Infusions: . sodium chloride 100 mL/hr at 05/16/19 1229  . sodium chloride Stopped (05/16/19 0013)  . piperacillin-tazobactam (ZOSYN)  IV 3.375 g (05/16/19 0813)   PRN Meds:.sodium chloride, acetaminophen, hydrALAZINE, morphine injection, ondansetron (ZOFRAN) IV, oxyCODONE-acetaminophen, senna-docusate  Antibiotics  :  Anti-infectives (From admission, onward)   Start     Dose/Rate Route Frequency Ordered Stop   05/14/19 1800  piperacillin-tazobactam (ZOSYN) IVPB 3.375 g  Status:  Discontinued     3.375 g 100 mL/hr over 30 Minutes Intravenous Every 6 hours 05/14/19 1514 05/14/19 1607   05/14/19 1800  piperacillin-tazobactam (ZOSYN) IVPB 3.375 g     3.375 g 12.5 mL/hr over 240 Minutes Intravenous Every 8 hours 05/14/19 1607         Objective   Vitals:   05/15/19  0801 05/15/19 1622 05/15/19 2238 05/16/19 0726  BP: (!) 157/66 (!) 145/81 131/71 136/69  Pulse: 71 80 74 73  Resp: 15 16 16 18   Temp: 100 F (37.8 C) (!) 101.3 F (38.5 C) 98.8 F (37.1 C) 99.1 F (37.3 C)  TempSrc: Oral Oral Oral   SpO2: 98% 99% 99% 99%  Weight:      Height:        SpO2: 99 %  Wt Readings from Last 3 Encounters:  05/11/19 79.4 kg  07/11/15 89.8 kg     Intake/Output Summary (Last 24 hours) at 05/16/2019 1353 Last data filed at 05/16/2019 1331 Gross per 24 hour  Intake 1844 ml  Output 2100 ml  Net -256 ml    Physical Exam:     Awake Alert, Oriented X 3, Normal affect No new F.N deficits,  [place].AT, Normal respiratory effort on room air, CTAB RRR,No Gallops,Rubs or new Murmurs,  +ve B.Sounds, Abd Soft, No tenderness, No rebound, guarding or rigidity. Bilateral ankles tender to palpation with minimal swelling, no overt erythema Left ring finger with noted tenderness, erythema, no fluctuance noted, no active drainage         I have personally reviewed the following:   Data Reviewed:  CBC Recent Labs  Lab 05/11/19 0347 05/11/19 0347 05/12/19 0342 05/13/19 0431 05/14/19 0423 05/15/19 0514 05/16/19 0505  WBC 19.0*   < > 17.1* 14.6* 15.7* 17.5* 16.4*  HGB 13.4   < > 10.8* 10.4* 11.1* 10.5* 11.0*  HCT 41.9   < > 34.7* 32.0* 35.5* 33.6* 35.2*  PLT 575*   < > 440* 431* 457* 480* 510*  MCV 87.5   < > 87.4 86.5 89.0 89.1 88.9  MCH 28.0   < > 27.2 28.1 27.8 27.9 27.8  MCHC 32.0   < > 31.1 32.5 31.3 31.3 31.3  RDW 14.3   < > 14.1 14.2 14.2 14.3 14.1  LYMPHSABS 1.6  --   --   --   --   --   --   MONOABS 1.0  --   --   --   --   --   --   EOSABS 0.0  --   --   --   --   --   --   BASOSABS 0.0  --   --   --   --   --   --    < > = values in this interval not displayed.    Chemistries  Recent Labs  Lab 05/11/19 0347 05/11/19 0347 05/12/19 0342 05/13/19 0431 05/14/19 0423 05/15/19 0514 05/16/19 0505  NA 136   < > 135 136 137 138 137    K 4.2   < > 4.3 4.7 4.6 4.2 4.4  CL 103   < > 108 110 109 108 107  CO2 19*   < > 19* 19* 21* 22 22  GLUCOSE 130*   < > 122* 100* 112* 103*  96  BUN 46*   < > 54* 42* 38* 33* 29*  CREATININE 2.06*   < > 1.98* 1.56* 1.49* 1.52* 1.41*  CALCIUM 9.4   < > 8.6* 8.5* 8.4* 8.2* 8.3*  MG  --   --   --  2.2  --   --   --   AST 31  --   --  39  --   --   --   ALT 41  --   --  49*  --   --   --   ALKPHOS 120  --   --  80  --   --   --   BILITOT 1.0  --   --  0.7  --   --   --    < > = values in this interval not displayed.   ------------------------------------------------------------------------------------------------------------------ No results for input(s): CHOL, HDL, LDLCALC, TRIG, CHOLHDL, LDLDIRECT in the last 72 hours.  No results found for: HGBA1C ------------------------------------------------------------------------------------------------------------------ No results for input(s): TSH, T4TOTAL, T3FREE, THYROIDAB in the last 72 hours.  Invalid input(s): FREET3 ------------------------------------------------------------------------------------------------------------------ No results for input(s): VITAMINB12, FOLATE, FERRITIN, TIBC, IRON, RETICCTPCT in the last 72 hours.  Coagulation profile No results for input(s): INR, PROTIME in the last 168 hours.  No results for input(s): DDIMER in the last 72 hours.  Cardiac Enzymes No results for input(s): CKMB, TROPONINI, MYOGLOBIN in the last 168 hours.  Invalid input(s): CK ------------------------------------------------------------------------------------------------------------------ No results found for: BNP  Micro Results Recent Results (from the past 240 hour(s))  SARS Coronavirus 2 by RT PCR (hospital order, performed in [facility] hospital lab) Nasopharyngeal Nasopharyngeal Swab     Status: None   Collection Time: 05/11/19 11:31 AM   Specimen: Nasopharyngeal Swab  Result Value Ref Range Status   SARS Coronavirus 2  NEGATIVE NEGATIVE Final    Comment: (NOTE) SARS-CoV-2 target nucleic acids are NOT DETECTED. The SARS-CoV-2 RNA is generally detectable in upper and lower respiratory specimens during the acute phase of infection. The lowest concentration of SARS-CoV-2 viral copies this assay can detect is 250 copies / mL. A negative result does not preclude SARS-CoV-2 infection and should not be used as the sole basis for treatment or other patient management decisions.  A negative result may occur with improper specimen collection / handling, submission of specimen other than nasopharyngeal swab, presence of viral mutation(s) within the areas targeted by this assay, and inadequate number of viral copies (<250 copies / mL). A negative result must be combined with clinical observations, patient history, and epidemiological information. Fact Sheet for Patients:   StrictlyIdeas.no Fact Sheet for Healthcare Providers: BankingDealers.co.za This test is not yet approved or cleared  by the [place] FDA and has been authorized for detection and/or diagnosis of SARS-CoV-2 by FDA under an Emergency Use Authorization (EUA).  This EUA will remain in effect (meaning this test can be used) for the duration of the COVID-19 declaration under Section 564(b)(1) of the Act, 21 U.S.C. section 360bbb-3(b)(1), unless the authorization is terminated or revoked sooner. Performed at [facility], [address] [place], [place] [address]   CULTURE, BLOOD (ROUTINE X 2) w Reflex to ID Panel     Status: None   Collection Time: 05/11/19  5:03 PM   Specimen: BLOOD  Result Value Ref Range Status   Specimen Description BLOOD LEFT ANTECUBITAL  Final   Special Requests   Final    BOTTLES DRAWN AEROBIC AND ANAEROBIC Blood Culture adequate volume   Culture  Final    NO GROWTH 5 DAYS Performed at [facility], [address] [place], [place] [address]     Report Status 05/16/2019 FINAL  Final  CULTURE, BLOOD (ROUTINE X 2) w Reflex to ID Panel     Status: None   Collection Time: 05/11/19  6:30 PM   Specimen: BLOOD  Result Value Ref Range Status   Specimen Description BLOOD RIGHT ANTECUBITAL  Final   Special Requests   Final    BOTTLES DRAWN AEROBIC AND ANAEROBIC Blood Culture adequate volume   Culture   Final    NO GROWTH 5 DAYS Performed at [facility], [address], [place], [place] [address]    Report Status 05/16/2019 FINAL  Final  SARS CORONAVIRUS 2 (TAT 6-24 HRS) Nasopharyngeal Nasopharyngeal Swab     Status: None   Collection Time: 05/14/19 11:14 AM   Specimen: Nasopharyngeal Swab  Result Value Ref Range Status   SARS Coronavirus 2 NEGATIVE NEGATIVE Final    Comment: (NOTE) SARS-CoV-2 target nucleic acids are NOT DETECTED. The SARS-CoV-2 RNA is generally detectable in upper and lower respiratory specimens during the acute phase of infection. Negative results do not preclude SARS-CoV-2 infection, do not rule out co-infections with other pathogens, and should not be used as the sole basis for treatment or other patient management decisions. Negative results must be combined with clinical observations, patient history, and epidemiological information. The expected result is Negative. Fact Sheet for Patients: SugarRoll.be Fact Sheet for Healthcare Providers: https://www.woods-mathews.com/ This test is not yet approved or cleared by the [place] FDA and  has been authorized for detection and/or diagnosis of SARS-CoV-2 by FDA under an Emergency Use Authorization (EUA). This EUA will remain  in effect (meaning this test can be used) for the duration of the COVID-19 declaration under Section 56 4(b)(1) of the Act, 21 U.S.C. section 360bbb-3(b)(1), unless the authorization is terminated or revoked sooner. Performed at [facility], [place] [address], [place],  [place] [address]   SARS Coronavirus 2 by RT PCR (hospital order, performed in [facility] hospital lab) Nasopharyngeal Nasopharyngeal Swab     Status: None   Collection Time: 05/14/19 12:40 PM   Specimen: Nasopharyngeal Swab  Result Value Ref Range Status   SARS Coronavirus 2 NEGATIVE NEGATIVE Final    Comment: (NOTE) SARS-CoV-2 target nucleic acids are NOT DETECTED. The SARS-CoV-2 RNA is generally detectable in upper and lower respiratory specimens during the acute phase of infection. The lowest concentration of SARS-CoV-2 viral copies this assay can detect is 250 copies / mL. A negative result does not preclude SARS-CoV-2 infection and should not be used as the sole basis for treatment or other patient management decisions.  A negative result may occur with improper specimen collection / handling, submission of specimen other than nasopharyngeal swab, presence of viral mutation(s) within the areas targeted by this assay, and inadequate number of viral copies (<250 copies / mL). A negative result must be combined with clinical observations, patient history, and epidemiological information. Fact Sheet for Patients:   StrictlyIdeas.no Fact Sheet for Healthcare Providers: BankingDealers.co.za This test is not yet approved or cleared  by the [place] FDA and has been authorized for detection and/or diagnosis of SARS-CoV-2 by FDA under an Emergency Use Authorization (EUA).  This EUA will remain in effect (meaning this test can be used) for the duration of the COVID-19 declaration under Section 564(b)(1) of the Act, 21 U.S.C. section 360bbb-3(b)(1), unless the authorization is terminated or  revoked sooner. Performed at [facility], [address] [place], [place] [address]   CULTURE, BLOOD (ROUTINE X 2) w Reflex to ID Panel     Status: None (Preliminary result)   Collection Time: 05/15/19  5:13 PM   Specimen: BLOOD  Result Value  Ref Range Status   Specimen Description BLOOD RIGHT ANTECUBITAL  Final   Special Requests   Final    BOTTLES DRAWN AEROBIC AND ANAEROBIC Blood Culture results may not be optimal due to an excessive volume of blood received in culture bottles   Culture   Final    NO GROWTH < 24 HOURS Performed at [facility], [address] [place], [place] [address]    Report Status PENDING  Incomplete  CULTURE, BLOOD (ROUTINE X 2) w Reflex to ID Panel     Status: None (Preliminary result)   Collection Time: 05/15/19  5:19 PM   Specimen: BLOOD  Result Value Ref Range Status   Specimen Description BLOOD BLOOD RIGHT HAND  Final   Special Requests   Final    BOTTLES DRAWN AEROBIC AND ANAEROBIC Blood Culture adequate volume   Culture   Final    NO GROWTH < 24 HOURS Performed at [facility], [address] [place], [place] [address]    Report Status PENDING  Incomplete  Anaerobic culture     Status: None (Preliminary result)   Collection Time: 05/15/19  7:15 PM   Specimen: Joint, Finger  Result Value Ref Range Status   Specimen Description FINGER  Final   Special Requests   Final    Normal Performed at [facility], [address] [place],  [address]    Culture PENDING  Incomplete   Report Status PENDING  Incomplete    Radiology Reports DG Chest 2 View  Result Date: 05/15/2019 CLINICAL DATA:  Fever EXAM: CHEST - 2 VIEW COMPARISON:  Radiograph 07/11/2015 FINDINGS: Bandlike atelectasis seen in the region of the right middle lobe. Lungs are otherwise clear. No consolidation, features of edema, pneumothorax, or effusion. The cardiomediastinal contours are unremarkable. No acute osseous or soft tissue abnormality. Degenerative changes are present in the imaged spine and shoulders. Multilevel flowing anterior osteophytosis, compatible with features of diffuse idiopathic skeletal hyperostosis (DISH). IMPRESSION: Bandlike atelectasis in the right middle lobe. No other  acute cardiopulmonary abnormality. Electronically Signed   By: Lovena Le M.D.   On: 05/15/2019 19:54   DG Wrist 2 Views Left  Result Date: 05/15/2019 CLINICAL DATA:  Left wrist pain and swelling.  History of gout. EXAM: LEFT WRIST - 2 VIEW COMPARISON:  None. FINDINGS: No acute fracture or dislocation. Borderline widening of the scapholunate interval. No bony erosions or periostitis. Mild radiocarpal and first CMC joint space narrowing with small marginal osteophytes. Remaining joint spaces are preserved. Bone mineralization is normal. Mild soft tissue swelling about the wrist. Punctate radiopaque density in superficial radial aspect of the distal forearm. IMPRESSION: 1.  No acute osseous abnormality. 2. Borderline widening of the scapholunate interval may reflect underlying chronic scapholunate ligament injury. 3. 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 Electronically Signed   By: Titus Dubin M.D.   On: 05/15/2019 10:09   DG Finger Ring Left  Result Date: 05/16/2019 CLINICAL DATA:  Erythema, swelling on left ring fingerPt reports his finger has been like that for "a while" but denies any injury or trauma to the finger. Pt unable to move finger very well. Redness and pus noted distally EXAM: LEFT RING  FINGER 2+V COMPARISON:  None. FINDINGS: No fracture or bone lesion. There is no bone resorption to suggest osteomyelitis. Bony prominence is noted from the ulnar aspect of the head of the middle phalanx of the fourth finger. There is significant soft tissue swelling, most evident along the dorsal ulnar aspect and volar aspect of the mid finger. No radiopaque foreign body. No soft tissue air. IMPRESSION: 1. No fracture or dislocation.  No evidence of osteomyelitis. 2. Soft tissue swelling without soft tissue air or radiopaque foreign body. Electronically Signed   By: Lajean Manes M.D.   On: 05/16/2019 10:48     Time Spent in minutes  30     Desiree Hane M.D on 05/16/2019 at 1:53  PM  To page go to [email or web address] - password [facility]

## 2019-05-16 NOTE — Consult Note (Signed)
Pharmacy Antibiotic Note  Michael Ramsey is a 64 y.o. male with past medical history significant for hypertension, CKD admitted on 05/11/2019 with gout flare and cellulitis. Pharmacy has been consulted for vancomycin dosing. Patient is also on Zosyn. Wound culture from finger pending, blood cultures have been negative to date.   Patient has been intermittently febrile. Persistent leukocytosis since admission but WBC stable. Inflammatory markers elevated. Finger imaging with soft tissue swelling, no evidence of osteomyelitis.   Scr down-trending since admission (1.98 >> 1.41) and appears to have leveled off. Un-clear baseline as patient does have CKD.    Plan: Vancomycin 1000 mg IV Q 24 hrs. Goal AUC 400-550. Expected AUC: 429.8, trough 11 SCr used: 1.41  Daily Scr per protocol. Watch for nephrotoxicity associated with vancomycin + Zosyn combination.  Height: 5\' 4"  (162.6 cm) Weight: 79.4 kg (175 lb) IBW/kg (Calculated) : 59.2  Temp (24hrs), Avg:99.7 F (37.6 C), Min:98.8 F (37.1 C), Max:101.3 F (38.5 C)  Recent Labs  Lab 05/12/19 0342 05/13/19 0431 05/14/19 0423 05/15/19 0514 05/16/19 0505  WBC 17.1* 14.6* 15.7* 17.5* 16.4*  CREATININE 1.98* 1.56* 1.49* 1.52* 1.41*    Estimated Creatinine Clearance: 49.7 mL/min (A) (by C-G formula based on SCr of 1.41 mg/dL (H)).    No Known Allergies  Antimicrobials this admission: Zosyn 5/13 >>  Vancomycin 5/15 >>   Dose adjustments this admission: n/a  Microbiology results: 5/14 BCx: NG <24h 5/14 Wound Cx (finger): normal, pending 5/10 BCx: NG x 5d  Thank you for allowing pharmacy to be a part of this patient's care.  [place] Resident 05/16/2019 2:25 PM

## 2019-05-16 NOTE — Progress Notes (Signed)
Occupational Therapy Treatment Patient Details Name: LOUISE VICTORY MRN: [id number] DOB: 09/04/1955 Today's Date: 05/16/2019    History of present illness Pt. is a 64 y.o. male who was admitted to [facility] with a history of gout flares, and he reports current joint pain (b/l LEs and L UE, especially ankles) is typical for these flares.   OT comments  Pt. education was provided, and reviewed with the pt. Pt. presents with 4/10 pain at rest. Pt. needs additional training to reinforce proper A/E technique. Pt. continues to benefit from OT services for ADL training, A/E training, and pt. education about joint protection, home modification, and DME. Pt. continues to be appropriate for SNF level of care upon discharge, with follow-up OT services to maximize independence with ADLs, and IADL functioning.     Follow Up Recommendations  SNF    Equipment Recommendations  3 in 1 bedside commode    Recommendations for Other Services      Precautions / Restrictions Precautions Precautions: Fall Restrictions Weight Bearing Restrictions: No       Mobility Bed Mobility Overal bed mobility: Needs Assistance Bed Mobility: Supine to Sit;Sit to Supine     Supine to sit: Min assist Sit to supine: Min assist      Transfers Overall transfer level: Needs assistance Equipment used: Rolling walker (2 wheeled) Transfers: Sit to/from Stand Sit to Stand: Total assist              Balance                                           ADL either performed or assessed with clinical judgement   ADL Overall ADL's : Needs assistance/impaired                                       General ADL Comments: Pt. education was provided, and reviewed about A/E use for LE ADLs.     Vision Baseline Vision/History: No visual deficits Patient Visual Report: No change from baseline     Perception     Praxis      Cognition Arousal/Alertness: Awake/alert Behavior During  Therapy: WFL for tasks assessed/performed Overall Cognitive Status: Within Functional Limits for tasks assessed                                          Exercises     Shoulder Instructions       General Comments      Pertinent Vitals/ Pain       Pain Assessment: 0-10 Pain Score: 4  Pain Descriptors / Indicators: Sore;Discomfort  Home Living                                          Prior Functioning/Environment              Frequency  Min 1X/week        Progress Toward Goals  OT Goals(current goals can now be found in the care plan section)        Plan      Co-evaluation  AM-PAC OT "6 Clicks" Daily Activity     Outcome Measure   Help from another person eating meals?: A Little Help from another person taking care of personal grooming?: A Little Help from another person toileting, which includes using toliet, bedpan, or urinal?: A Lot Help from another person bathing (including washing, rinsing, drying)?: A Lot Help from another person to put on and taking off regular upper body clothing?: A Little Help from another person to put on and taking off regular lower body clothing?: A Lot 6 Click Score: 15    End of Session Equipment Utilized During Treatment: Gait belt;Rolling walker  OT Visit Diagnosis: Other abnormalities of gait and mobility (R26.89);Muscle weakness (generalized) (M62.81);Pain   Activity Tolerance Patient tolerated treatment well   Patient Left in bed;with call bell/phone within reach;with bed alarm set   Nurse Communication          Time: 1435-1450 OT Time Calculation (min): 15 min  Charges: OT General Charges $OT Visit: 1 Visit OT Treatments $Self Care/Home Management : 8-22 mins  Harrel Carina, MS, OTR/L  Harrel Carina 05/16/2019, 3:19 PM

## 2019-05-17 DIAGNOSIS — M79645 Pain in left finger(s): Secondary | ICD-10-CM

## 2019-05-17 DIAGNOSIS — L539 Erythematous condition, unspecified: Secondary | ICD-10-CM

## 2019-05-17 LAB — BASIC METABOLIC PANEL
Anion gap: 9 (ref 5–15)
BUN: 27 mg/dL — ABNORMAL HIGH (ref 8–23)
CO2: 21 mmol/L — ABNORMAL LOW (ref 22–32)
Calcium: 8.3 mg/dL — ABNORMAL LOW (ref 8.9–10.3)
Chloride: 106 mmol/L (ref 98–111)
Creatinine, Ser: 1.47 mg/dL — ABNORMAL HIGH (ref 0.61–1.24)
GFR calc Af Amer: 57 mL/min — ABNORMAL LOW (ref >60)
GFR calc non Af Amer: 49 mL/min — ABNORMAL LOW (ref >60)
Glucose, Bld: 101 mg/dL — ABNORMAL HIGH (ref 70–99)
Potassium: 4.1 mmol/L (ref 3.5–5.1)
Sodium: 136 mmol/L (ref 135–145)

## 2019-05-17 LAB — CBC
HCT: 32.6 % — ABNORMAL LOW (ref 39.0–52.0)
Hemoglobin: 10.5 g/dL — ABNORMAL LOW (ref 13.0–17.0)
MCH: 27.6 pg (ref 26.0–34.0)
MCHC: 32.2 g/dL (ref 30.0–36.0)
MCV: 85.8 fL (ref 80.0–100.0)
Platelets: 602 10*3/uL — ABNORMAL HIGH (ref 150–400)
RBC: 3.8 MIL/uL — ABNORMAL LOW (ref 4.22–5.81)
RDW: 14.2 % (ref 11.5–15.5)
WBC: 13.8 10*3/uL — ABNORMAL HIGH (ref 4.0–10.5)
nRBC: 0 % (ref 0.0–0.2)

## 2019-05-17 MED ORDER — VANCOMYCIN HCL 750 MG/150ML IV SOLN
750.0000 mg | INTRAVENOUS | Status: DC
  Administered 2019-05-17 – 2019-05-18 (×2): 750 mg via INTRAVENOUS
  Filled 2019-05-17 (×2): qty 150

## 2019-05-17 NOTE — Consult Note (Signed)
Pharmacy Antibiotic Note  Michael Ramsey is a 64 y.o. male with past medical history significant for hypertension, CKD admitted on 05/11/2019 with gout flare and cellulitis. Pharmacy has been consulted for vancomycin dosing. Patient is also on Zosyn. Wound culture from finger pending, blood cultures have been negative to date.   Patient has been intermittently febrile. Persistent leukocytosis since admission but WBC stable. Inflammatory markers elevated. Finger imaging with soft tissue swelling, no evidence of osteomyelitis.   Scr down-trending since admission (1.98 >> 1.47) and appears to have leveled off. Un-clear baseline as patient does have CKD.    Plan:  1) adjust vancomycin dose to 750 mg IV Q 24 hrs Goal AUC 400-550 Expected AUC: 482 SCr used: 1.47 Css (calculated): 31.0/12.6 mcg/mL  2) continue Zosyn 3.375 g EI every 8 hours  Daily Scr per protocol. Watch for nephrotoxicity associated with vancomycin + Zosyn combination.  Height: 5\' 4"  (162.6 cm) Weight: 79.4 kg (175 lb) IBW/kg (Calculated) : 59.2  Temp (24hrs), Avg:98.8 F (37.1 C), Min:98.4 F (36.9 C), Max:99.3 F (37.4 C)  Recent Labs  Lab 05/13/19 0431 05/14/19 0423 05/15/19 0514 05/16/19 0505 05/17/19 0700  WBC 14.6* 15.7* 17.5* 16.4* 13.8*  CREATININE 1.56* 1.49* 1.52* 1.41* 1.47*    Estimated Creatinine Clearance: 47.7 mL/min (A) (by C-G formula based on SCr of 1.47 mg/dL (H)).    No Known Allergies  Antimicrobials this admission: Zosyn 5/13 >>  Vancomycin 5/15 >>   Microbiology results: 5/14 BCx: NGTD 5/14 Wound Cx (finger): normal, pending 5/10 BCx: NG final  Thank you for allowing pharmacy to be a part of this patient's care.  [place] Resident 05/17/2019 1:51 PM

## 2019-05-17 NOTE — Consult Note (Signed)
ORTHOPAEDIC CONSULTATION  REQUESTING PHYSICIAN: Desiree Hane, MD  Chief Complaint: Left ring finger DIP erythema and swelling  HPI: Michael Ramsey is a 64 y.o. male who has a history of gout flares.  He is admitted for treatment of a significant flare involving both ankles and his left wrist and elbow.  His primary doctor is Dr. Delight Stare.  Patient takes Uloric at home for his gout.  He had a uric acid level of 10.8 with a white count of 19 and a CRP of 19.5 at presentation.  He was having symptoms despite taking his Uloric.  Patient was treated with prednisone in the ED and has been also treated with colchicine while in the hospital.  He was placed on IV Zosyn for intermittent fevers and an elevated white count.  Orthopedics is consulted for evaluation of erythema and swelling over the dorsal ulnar DIP joint of the left hand.  Patient was noted by the nursing staff to have some drainage from this location while hospitalized.  Today the patient states that all of his joints are feeling better as is his left ring finger.  He denies any recent drainage.  Patient feels that the erythema has improved.  Vancomycin was added to his IV antibiotics yesterday.  Patient states that he has some swelling in the location of his left ring finger DIP joint at baseline.  Past Medical History:  Diagnosis Date  . CKD (chronic kidney disease), stage IV ([place])   . Gout   . Hypertension    Past Surgical History:  Procedure Laterality Date  . CHOLECYSTECTOMY     Social History   Socioeconomic History  . Marital status: Divorced    Spouse name: Not on file  . Number of children: Not on file  . Years of education: Not on file  . Highest education level: Not on file  Occupational History  . Not on file  Tobacco Use  . Smoking status: Never Smoker  Substance and Sexual Activity  . Alcohol use: No  . Drug use: Not on file  . Sexual activity: Not on file  Other Topics Concern  . Not on file  Social  History Narrative  . Not on file   Social Determinants of Health   Financial Resource Strain:   . Difficulty of Paying Living Expenses:   Food Insecurity:   . Worried About Charity fundraiser in the Last Year:   . Arboriculturist in the Last Year:   Transportation Needs:   . Film/video editor (Medical):   Marland Kitchen Lack of Transportation (Non-Medical):   Physical Activity:   . Days of Exercise per Week:   . Minutes of Exercise per Session:   Stress:   . Feeling of Stress :   Social Connections:   . Frequency of Communication with Friends and Family:   . Frequency of Social Gatherings with Friends and Family:   . Attends Religious Services:   . Active Member of Clubs or Organizations:   . Attends Archivist Meetings:   Marland Kitchen Marital Status:    Family History  Problem Relation Age of Onset  . Hypertension Other    No Known Allergies Prior to Admission medications   Medication Sig Start Date End Date Taking? Authorizing Provider  carvedilol (COREG) 12.5 MG tablet Take 12.5 mg by mouth 2 (two) times daily with a meal.   Yes [provider]  cetirizine (ZYRTEC) 10 MG tablet Take 10 mg by mouth  daily.   Yes [provider]  febuxostat (ULORIC) 40 MG tablet Take 40 mg by mouth in the morning and at bedtime.    Yes [provider]  acetaminophen (TYLENOL) 325 MG tablet Take 2 tablets (650 mg total) by mouth every 6 (six) hours as needed for mild pain or fever. 05/14/19   Val Riles, MD  amLODipine (NORVASC) 10 MG tablet Take 1 tablet (10 mg total) by mouth daily. 05/14/19   Val Riles, MD  colchicine 0.6 MG tablet Take 1 tablet (0.6 mg total) by mouth daily for 12 days. 05/15/19 05/27/19  Val Riles, MD  sodium bicarbonate 650 MG tablet Take 1 tablet (650 mg total) by mouth 3 (three) times daily for 7 days. 05/14/19 05/21/19  Val Riles, MD   DG Chest 2 View  Result Date: 05/15/2019 CLINICAL DATA:  Fever EXAM: CHEST - 2 VIEW COMPARISON:   Radiograph 07/11/2015 FINDINGS: Bandlike atelectasis seen in the region of the right middle lobe. Lungs are otherwise clear. No consolidation, features of edema, pneumothorax, or effusion. The cardiomediastinal contours are unremarkable. No acute osseous or soft tissue abnormality. Degenerative changes are present in the imaged spine and shoulders. Multilevel flowing anterior osteophytosis, compatible with features of diffuse idiopathic skeletal hyperostosis (DISH). IMPRESSION: Bandlike atelectasis in the right middle lobe. No other acute cardiopulmonary abnormality. Electronically Signed   By: Lovena Le M.D.   On: 05/15/2019 19:54   DG Finger Ring Left  Result Date: 05/16/2019 CLINICAL DATA:  Erythema, swelling on left ring fingerPt reports his finger has been like that for "a while" but denies any injury or trauma to the finger. Pt unable to move finger very well. Redness and pus noted distally EXAM: LEFT RING FINGER 2+V COMPARISON:  None. FINDINGS: No fracture or bone lesion. There is no bone resorption to suggest osteomyelitis. Bony prominence is noted from the ulnar aspect of the head of the middle phalanx of the fourth finger. There is significant soft tissue swelling, most evident along the dorsal ulnar aspect and volar aspect of the mid finger. No radiopaque foreign body. No soft tissue air. IMPRESSION: 1. No fracture or dislocation.  No evidence of osteomyelitis. 2. Soft tissue swelling without soft tissue air or radiopaque foreign body. Electronically Signed   By: Lajean Manes M.D.   On: 05/16/2019 10:48    Positive ROS: All other systems have been reviewed and were otherwise negative with the exception of those mentioned in the HPI and as above.  Physical Exam: General: Alert, no acute distress  MUSCULOSKELETAL: Left hand: Patient's left ring finger shows dorsal ulnar swelling which is not tender today.  Patient has faint erythema in this area.  No active drainage can be expressed.   Patient can tolerate active and passive range of motion of his DIP joint.  His fingers are all well perfused and he has intact sensation light touch in all digits.  Assessment: Left ring finger DIP joint erythema and swelling secondary to gout versus cellulitis/abscess.  Plan: I reviewed the patient's left ring finger x-rays.  Patient does not have any significant osseous erosions but does have osseous overgrowth at the DIP joint.  There is no fracture, dislocation, or subluxation.  Patient is currently on Zosyn and vancomycin.  He also is receiving colchicine in addition to his Uloric for gout.  All of the patient's joints are improving.  I do not recommend any surgical intervention, including I&D at this time as he is clinically improving on his current medical  regimen.  I will continue to follow him to ensure he is improving.  Patient states he does not have a rheumatologist.  I recommend he be seen by the rheumatology consult while an inpatient to have care established with a rheumatologist given his history of significant gout flares.    Thornton Park, MD    05/17/2019 12:32 PM

## 2019-05-17 NOTE — Progress Notes (Addendum)
[facility]  PROGRESS NOTE  Michael Ramsey JHE:[id number] DOB: 1955/02/02 DOA: 05/11/2019 PCP: Marguerita Merles, MD Admit date - 05/11/2019   Admitting Physician Karie Kirks, DO  Outpatient Primary MD for the patient is Marguerita Merles, MD  LOS - 5 Brief Narrative   Michael Ramsey is a 64 y.o. year old male with medical history significant for gout, HTN and CKD-IV who presented on 05/11/2019 with bilateral ankle pain left wrist and left elbow pain consistent with previous gout flares.  Patient was found to have uric acid of 10.8, With white count of 19 afebrile, with CRP of 19.5.  He was given 50 mg of prednisone in the ED and continued on prednisone due to significant ankle pain limiting his ability to walk. Hospital course complicated by persistent joint pain requiring addition of colchicine to his home Uloric as well as IV Zosyn for intermittent fevers during hospital stay.  Subjective  Today reports improvement in joint pain, states noticed some drainage from left ring finger, denies any fevers or chills  A & P   Diffuse joint pain (bilateral ankles, wrists, left ring finger), concerning for a gout flare, also considering potential infectious arthritis particularly at left ring finger given noted erythema and reported drainage, xr and physical exam not consistent with abscess.  Has remained afebrile over last 24 hours, white count downtrending after starting empiric vancomycin in addition to Zosyn, x-ray of left finger show soft tissue swelling. -Continue empiric Zosyn, Vanco -Appreciate orthopedic recommendations, given location of left ring finger low likelihood of potential aspiration will monitor on antibiotics. -Rheumatology consulted, to establish care given history of significant gout flares despite Uloric adherence -Continue Norco, colchicine, Uloric  Leukocytosis with intermittent fevers, some concern for potential infection, improving.  Blood cultures unremarkable, most likely  source potentially being left ring finger given soft tissue swelling noted on x-ray. -Empiric IV vancomycin to IV Zosyn as noted above -Monitor blood cultures, follow-up superficial finger swab -Continue to closely monitor  CKD stage IV, stable.  Peak creatinine of 2.06 during hospital stay, back to baseline. -Closely monitor BMP while on colchicine  HTN, stable -Continue Coreg, amlodipine    Family Communication  : None at bedside  Code Status : Full  Disposition Plan  :  Patient is from home. Anticipated d/c date: 2 to 3 days. Barriers to d/c or necessity for inpatient status:  IV antibiotics for ruling out potential infectious arthritis, continue pain control for potential gout flare.  Plan discharge to SNF once medically stable Consults  : Orthopedics, rheumatology  Procedures  : None  DVT Prophylaxis  :  Lovenox   Lab Results  Component Value Date   PLT 602 (H) 05/17/2019    Diet :  Diet Order            Diet Heart Room service appropriate? Yes; Fluid consistency: Thin  Diet effective now        Diet - low sodium heart healthy               Inpatient Medications Scheduled Meds: . amLODipine  10 mg Oral Daily  . carvedilol  12.5 mg Oral BID WC  . colchicine  0.6 mg Oral Daily  . enoxaparin (LOVENOX) injection  40 mg Subcutaneous Q24H  . febuxostat  40 mg Oral Daily  . loratadine  10 mg Oral Daily   Continuous Infusions: . sodium chloride 100 mL/hr at 05/17/19 0353  . piperacillin-tazobactam (ZOSYN)  IV 3.375 g (05/17/19  1522)  . vancomycin 750 mg (05/17/19 1522)   PRN Meds:.acetaminophen, hydrALAZINE, morphine injection, ondansetron (ZOFRAN) IV, oxyCODONE-acetaminophen, senna-docusate  Antibiotics  :   Anti-infectives (From admission, onward)   Start     Dose/Rate Route Frequency Ordered Stop   05/17/19 1600  vancomycin (VANCOCIN) IVPB 1000 mg/200 mL premix  Status:  Discontinued     1,000 mg 200 mL/hr over 60 Minutes Intravenous Every 24 hours  05/16/19 1437 05/17/19 1356   05/17/19 1600  vancomycin (VANCOREADY) IVPB 750 mg/150 mL     750 mg 150 mL/hr over 60 Minutes Intravenous Every 24 hours 05/17/19 1356     05/16/19 1600  vancomycin (VANCOCIN) IVPB 1000 mg/200 mL premix     1,000 mg 200 mL/hr over 60 Minutes Intravenous  Once 05/16/19 1418 05/16/19 1757   05/14/19 1800  piperacillin-tazobactam (ZOSYN) IVPB 3.375 g  Status:  Discontinued     3.375 g 100 mL/hr over 30 Minutes Intravenous Every 6 hours 05/14/19 1514 05/14/19 1607   05/14/19 1800  piperacillin-tazobactam (ZOSYN) IVPB 3.375 g     3.375 g 12.5 mL/hr over 240 Minutes Intravenous Every 8 hours 05/14/19 1607         Objective   Vitals:   05/16/19 1555 05/17/19 0047 05/17/19 0800 05/17/19 1535  BP: 135/66 (!) 145/74 (!) 153/75 (!) 152/81  Pulse: 72 69 71 74  Resp: 17 18 20 18   Temp: 99.3 F (37.4 C) 98.4 F (36.9 C) 98.8 F (37.1 C) 99.5 F (37.5 C)  TempSrc:  Oral Oral Oral  SpO2: 99% 99% 99% 100%  Weight:      Height:        SpO2: 100 %  Wt Readings from Last 3 Encounters:  05/11/19 79.4 kg  07/11/15 89.8 kg     Intake/Output Summary (Last 24 hours) at 05/17/2019 1656 Last data filed at 05/17/2019 1652 Gross per 24 hour  Intake 3126.17 ml  Output 250 ml  Net 2876.17 ml    Physical Exam:     Awake Alert, Oriented X 3, Normal affect No new F.N deficits,  .AT, Normal respiratory effort on room air, CTAB RRR,No Gallops,Rubs or new Murmurs,  +ve B.Sounds, Abd Soft, No tenderness, No rebound, guarding or rigidity. Bilateral ankles tender to palpation with minimal swelling, no overt erythema Left ring finger with noted tenderness, erythema, no fluctuance, no active drainage         I have personally reviewed the following:   Data Reviewed:  CBC Recent Labs  Lab 05/11/19 0347 05/12/19 0342 05/13/19 0431 05/14/19 0423 05/15/19 0514 05/16/19 0505 05/17/19 0700  WBC 19.0*   < > 14.6* 15.7* 17.5* 16.4* 13.8*  HGB 13.4    < > 10.4* 11.1* 10.5* 11.0* 10.5*  HCT 41.9   < > 32.0* 35.5* 33.6* 35.2* 32.6*  PLT 575*   < > 431* 457* 480* 510* 602*  MCV 87.5   < > 86.5 89.0 89.1 88.9 85.8  MCH 28.0   < > 28.1 27.8 27.9 27.8 27.6  MCHC 32.0   < > 32.5 31.3 31.3 31.3 32.2  RDW 14.3   < > 14.2 14.2 14.3 14.1 14.2  LYMPHSABS 1.6  --   --   --   --   --   --   MONOABS 1.0  --   --   --   --   --   --   EOSABS 0.0  --   --   --   --   --   --  BASOSABS 0.0  --   --   --   --   --   --    < > = values in this interval not displayed.    Chemistries  Recent Labs  Lab 05/11/19 0347 05/12/19 0342 05/13/19 0431 05/14/19 0423 05/15/19 0514 05/16/19 0505 05/17/19 0700  NA 136   < > 136 137 138 137 136  K 4.2   < > 4.7 4.6 4.2 4.4 4.1  CL 103   < > 110 109 108 107 106  CO2 19*   < > 19* 21* 22 22 21*  GLUCOSE 130*   < > 100* 112* 103* 96 101*  BUN 46*   < > 42* 38* 33* 29* 27*  CREATININE 2.06*   < > 1.56* 1.49* 1.52* 1.41* 1.47*  CALCIUM 9.4   < > 8.5* 8.4* 8.2* 8.3* 8.3*  MG  --   --  2.2  --   --   --   --   AST 31  --  39  --   --   --   --   ALT 41  --  49*  --   --   --   --   ALKPHOS 120  --  80  --   --   --   --   BILITOT 1.0  --  0.7  --   --   --   --    < > = values in this interval not displayed.   ------------------------------------------------------------------------------------------------------------------ No results for input(s): CHOL, HDL, LDLCALC, TRIG, CHOLHDL, LDLDIRECT in the last 72 hours.  No results found for: HGBA1C ------------------------------------------------------------------------------------------------------------------ No results for input(s): TSH, T4TOTAL, T3FREE, THYROIDAB in the last 72 hours.  Invalid input(s): FREET3 ------------------------------------------------------------------------------------------------------------------ No results for input(s): VITAMINB12, FOLATE, FERRITIN, TIBC, IRON, RETICCTPCT in the last 72 hours.  Coagulation profile No results for  input(s): INR, PROTIME in the last 168 hours.  No results for input(s): DDIMER in the last 72 hours.  Cardiac Enzymes No results for input(s): CKMB, TROPONINI, MYOGLOBIN in the last 168 hours.  Invalid input(s): CK ------------------------------------------------------------------------------------------------------------------ No results found for: BNP  Micro Results Recent Results (from the past 240 hour(s))  SARS Coronavirus 2 by RT PCR (hospital order, performed in [facility] hospital lab) Nasopharyngeal Nasopharyngeal Swab     Status: None   Collection Time: 05/11/19 11:31 AM   Specimen: Nasopharyngeal Swab  Result Value Ref Range Status   SARS Coronavirus 2 NEGATIVE NEGATIVE Final    Comment: (NOTE) SARS-CoV-2 target nucleic acids are NOT DETECTED. The SARS-CoV-2 RNA is generally detectable in upper and lower respiratory specimens during the acute phase of infection. The lowest concentration of SARS-CoV-2 viral copies this assay can detect is 250 copies / mL. A negative result does not preclude SARS-CoV-2 infection and should not be used as the sole basis for treatment or other patient management decisions.  A negative result may occur with improper specimen collection / handling, submission of specimen other than nasopharyngeal swab, presence of viral mutation(s) within the areas targeted by this assay, and inadequate number of viral copies (<250 copies / mL). A negative result must be combined with clinical observations, patient history, and epidemiological information. Fact Sheet for Patients:   StrictlyIdeas.no Fact Sheet for Healthcare Providers: BankingDealers.co.za This test is not yet approved or cleared  by the [place] FDA and has been authorized for detection and/or diagnosis of SARS-CoV-2 by FDA under an Emergency Use Authorization (EUA).  This EUA will  remain in effect (meaning this test can be used) for the  duration of the COVID-19 declaration under Section 564(b)(1) of the Act, 21 U.S.C. section 360bbb-3(b)(1), unless the authorization is terminated or revoked sooner. Performed at [facility], [address] [place], [place] [address]   CULTURE, BLOOD (ROUTINE X 2) w Reflex to ID Panel     Status: None   Collection Time: 05/11/19  5:03 PM   Specimen: BLOOD  Result Value Ref Range Status   Specimen Description BLOOD LEFT ANTECUBITAL  Final   Special Requests   Final    BOTTLES DRAWN AEROBIC AND ANAEROBIC Blood Culture adequate volume   Culture   Final    NO GROWTH 5 DAYS Performed at [facility], [address] [place], [place] [address]    Report Status 05/16/2019 FINAL  Final  CULTURE, BLOOD (ROUTINE X 2) w Reflex to ID Panel     Status: None   Collection Time: 05/11/19  6:30 PM   Specimen: BLOOD  Result Value Ref Range Status   Specimen Description BLOOD RIGHT ANTECUBITAL  Final   Special Requests   Final    BOTTLES DRAWN AEROBIC AND ANAEROBIC Blood Culture adequate volume   Culture   Final    NO GROWTH 5 DAYS Performed at [facility], [address], [place], [place] [address]    Report Status 05/16/2019 FINAL  Final  SARS CORONAVIRUS 2 (TAT 6-24 HRS) Nasopharyngeal Nasopharyngeal Swab     Status: None   Collection Time: 05/14/19 11:14 AM   Specimen: Nasopharyngeal Swab  Result Value Ref Range Status   SARS Coronavirus 2 NEGATIVE NEGATIVE Final    Comment: (NOTE) SARS-CoV-2 target nucleic acids are NOT DETECTED. The SARS-CoV-2 RNA is generally detectable in upper and lower respiratory specimens during the acute phase of infection. Negative results do not preclude SARS-CoV-2 infection, do not rule out co-infections with other pathogens, and should not be used as the sole basis for treatment or other patient management decisions. Negative results must be combined with clinical observations, patient history, and epidemiological information.  The expected result is Negative. Fact Sheet for Patients: SugarRoll.be Fact Sheet for Healthcare Providers: https://www.woods-mathews.com/ This test is not yet approved or cleared by the [place] FDA and  has been authorized for detection and/or diagnosis of SARS-CoV-2 by FDA under an Emergency Use Authorization (EUA). This EUA will remain  in effect (meaning this test can be used) for the duration of the COVID-19 declaration under Section 56 4(b)(1) of the Act, 21 U.S.C. section 360bbb-3(b)(1), unless the authorization is terminated or revoked sooner. Performed at [facility], [place] [address], [place], [place] [address]   SARS Coronavirus 2 by RT PCR (hospital order, performed in [facility] hospital lab) Nasopharyngeal Nasopharyngeal Swab     Status: None   Collection Time: 05/14/19 12:40 PM   Specimen: Nasopharyngeal Swab  Result Value Ref Range Status   SARS Coronavirus 2 NEGATIVE NEGATIVE Final    Comment: (NOTE) SARS-CoV-2 target nucleic acids are NOT DETECTED. The SARS-CoV-2 RNA is generally detectable in upper and lower respiratory specimens during the acute phase of infection. The lowest concentration of SARS-CoV-2 viral copies this assay can detect is 250 copies / mL. A negative result does not preclude SARS-CoV-2 infection and should not be used as the sole basis for treatment or other patient management decisions.  A negative result may occur with improper specimen collection / handling, submission of specimen other than nasopharyngeal swab, presence  of viral mutation(s) within the areas targeted by this assay, and inadequate number of viral copies (<250 copies / mL). A negative result must be combined with clinical observations, patient history, and epidemiological information. Fact Sheet for Patients:   StrictlyIdeas.no Fact Sheet for Healthcare Providers:  BankingDealers.co.za This test is not yet approved or cleared  by the [place] FDA and has been authorized for detection and/or diagnosis of SARS-CoV-2 by FDA under an Emergency Use Authorization (EUA).  This EUA will remain in effect (meaning this test can be used) for the duration of the COVID-19 declaration under Section 564(b)(1) of the Act, 21 U.S.C. section 360bbb-3(b)(1), unless the authorization is terminated or revoked sooner. Performed at [facility], [address] [place], [place] [address]   CULTURE, BLOOD (ROUTINE X 2) w Reflex to ID Panel     Status: None (Preliminary result)   Collection Time: 05/15/19  5:13 PM   Specimen: BLOOD  Result Value Ref Range Status   Specimen Description BLOOD RIGHT ANTECUBITAL  Final   Special Requests   Final    BOTTLES DRAWN AEROBIC AND ANAEROBIC Blood Culture results may not be optimal due to an excessive volume of blood received in culture bottles   Culture   Final    NO GROWTH 2 DAYS Performed at [facility], [address], [place], [place] [address]    Report Status PENDING  Incomplete  CULTURE, BLOOD (ROUTINE X 2) w Reflex to ID Panel     Status: None (Preliminary result)   Collection Time: 05/15/19  5:19 PM   Specimen: BLOOD  Result Value Ref Range Status   Specimen Description BLOOD BLOOD RIGHT HAND  Final   Special Requests   Final    BOTTLES DRAWN AEROBIC AND ANAEROBIC Blood Culture adequate volume   Culture   Final    NO GROWTH 2 DAYS Performed at [facility], [address] [place], [place] [address]    Report Status PENDING  Incomplete  Anaerobic culture     Status: None (Preliminary result)   Collection Time: 05/15/19  7:15 PM   Specimen: Joint, Finger  Result Value Ref Range Status   Specimen Description FINGER  Final   Special Requests   Final    Normal Performed at [facility], [address], [place], [place] [address]    Culture PENDING   Incomplete   Report Status PENDING  Incomplete    Radiology Reports DG Chest 2 View  Result Date: 05/15/2019 CLINICAL DATA:  Fever EXAM: CHEST - 2 VIEW COMPARISON:  Radiograph 07/11/2015 FINDINGS: Bandlike atelectasis seen in the region of the right middle lobe. Lungs are otherwise clear. No consolidation, features of edema, pneumothorax, or effusion. The cardiomediastinal contours are unremarkable. No acute osseous or soft tissue abnormality. Degenerative changes are present in the imaged spine and shoulders. Multilevel flowing anterior osteophytosis, compatible with features of diffuse idiopathic skeletal hyperostosis (DISH). IMPRESSION: Bandlike atelectasis in the right middle lobe. No other acute cardiopulmonary abnormality. Electronically Signed   By: Lovena Le M.D.   On: 05/15/2019 19:54   DG Wrist 2 Views Left  Result Date: 05/15/2019 CLINICAL DATA:  Left wrist pain and swelling.  History of gout. EXAM: LEFT WRIST - 2 VIEW COMPARISON:  None. FINDINGS: No acute fracture or dislocation. Borderline widening of the scapholunate interval. No bony erosions or periostitis. Mild radiocarpal and first CMC joint space narrowing with small marginal osteophytes. Remaining joint spaces are preserved. Bone mineralization is normal. Mild  soft tissue swelling about the wrist. Punctate radiopaque density in superficial radial aspect of the distal forearm. IMPRESSION: 1.  No acute osseous abnormality. 2. Borderline widening of the scapholunate interval may reflect underlying chronic scapholunate ligament injury. 3. 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 Electronically Signed   By: Titus Dubin M.D.   On: 05/15/2019 10:09   DG Finger Ring Left  Result Date: 05/16/2019 CLINICAL DATA:  Erythema, swelling on left ring fingerPt reports his finger has been like that for "a while" but denies any injury or trauma to the finger. Pt unable to move finger very well. Redness and pus noted distally EXAM:  LEFT RING FINGER 2+V COMPARISON:  None. FINDINGS: No fracture or bone lesion. There is no bone resorption to suggest osteomyelitis. Bony prominence is noted from the ulnar aspect of the head of the middle phalanx of the fourth finger. There is significant soft tissue swelling, most evident along the dorsal ulnar aspect and volar aspect of the mid finger. No radiopaque foreign body. No soft tissue air. IMPRESSION: 1. No fracture or dislocation.  No evidence of osteomyelitis. 2. Soft tissue swelling without soft tissue air or radiopaque foreign body. Electronically Signed   By: Lajean Manes M.D.   On: 05/16/2019 10:48     Time Spent in minutes  30     Desiree Hane M.D on 05/17/2019 at 4:56 PM  To page go to [email or web address] - password [facility]

## 2019-05-18 LAB — BASIC METABOLIC PANEL
Anion gap: 10 (ref 5–15)
BUN: 28 mg/dL — ABNORMAL HIGH (ref 8–23)
CO2: 21 mmol/L — ABNORMAL LOW (ref 22–32)
Calcium: 8.5 mg/dL — ABNORMAL LOW (ref 8.9–10.3)
Chloride: 107 mmol/L (ref 98–111)
Creatinine, Ser: 1.56 mg/dL — ABNORMAL HIGH (ref 0.61–1.24)
GFR calc Af Amer: 53 mL/min — ABNORMAL LOW (ref >60)
GFR calc non Af Amer: 46 mL/min — ABNORMAL LOW (ref >60)
Glucose, Bld: 97 mg/dL (ref 70–99)
Potassium: 3.9 mmol/L (ref 3.5–5.1)
Sodium: 138 mmol/L (ref 135–145)

## 2019-05-18 LAB — CBC
HCT: 33.1 % — ABNORMAL LOW (ref 39.0–52.0)
Hemoglobin: 10.4 g/dL — ABNORMAL LOW (ref 13.0–17.0)
MCH: 27.6 pg (ref 26.0–34.0)
MCHC: 31.4 g/dL (ref 30.0–36.0)
MCV: 87.8 fL (ref 80.0–100.0)
Platelets: 623 10*3/uL — ABNORMAL HIGH (ref 150–400)
RBC: 3.77 MIL/uL — ABNORMAL LOW (ref 4.22–5.81)
RDW: 13.9 % (ref 11.5–15.5)
WBC: 13.3 10*3/uL — ABNORMAL HIGH (ref 4.0–10.5)
nRBC: 0 % (ref 0.0–0.2)

## 2019-05-18 LAB — SARS CORONAVIRUS 2 (TAT 6-24 HRS): SARS Coronavirus 2: NEGATIVE

## 2019-05-18 MED ORDER — SODIUM CHLORIDE 0.9 % IV SOLN
3.0000 g | Freq: Four times a day (QID) | INTRAVENOUS | Status: DC
  Administered 2019-05-18 – 2019-05-19 (×4): 3 g via INTRAVENOUS
  Filled 2019-05-18: qty 8
  Filled 2019-05-18: qty 3
  Filled 2019-05-18 (×4): qty 8

## 2019-05-18 NOTE — Care Management Important Message (Signed)
Important Message  Patient Details  Name: Michael Ramsey MRN: [id number] Date of Birth: [birth year]   Medicare Important Message Given:  Yes     Dannette Barbara 05/18/2019, 2:51 PM

## 2019-05-18 NOTE — Progress Notes (Signed)
  Subjective:  Patient states his joints feel better.  He denies active drainage from the left ring finger.    Objective:   VITALS:   Vitals:   05/17/19 0800 05/17/19 1535 05/18/19 0048 05/18/19 0813  BP: (!) 153/75 (!) 152/81 (!) 157/89 (!) 153/77  Pulse: 71 74 75 73  Resp: 20 18 17 20   Temp: 98.8 F (37.1 C) 99.5 F (37.5 C) 98 F (36.7 C) 98.5 F (36.9 C)  TempSrc: Oral Oral Oral Oral  SpO2: 99% 100% 97% 98%  Weight:      Height:        PHYSICAL EXAM: Left ring finger: Erythema improving.   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  He can tolerate active and passive ROM of DIP.  NVI.   LABS  Results for orders placed or performed during the hospital encounter of 05/11/19 (from the past 24 hour(s))  CBC     Status: Abnormal   Collection Time: 05/18/19  4:25 AM  Result Value Ref Range   WBC 13.3 (H) 4.0 - 10.5 K/uL   RBC 3.77 (L) 4.22 - 5.81 MIL/uL   Hemoglobin 10.4 (L) 13.0 - 17.0 g/dL   HCT 33.1 (L) 39.0 - 52.0 %   MCV 87.8 80.0 - 100.0 fL   MCH 27.6 26.0 - 34.0 pg   MCHC 31.4 30.0 - 36.0 g/dL   RDW 13.9 11.5 - 15.5 %   Platelets 623 (H) 150 - 400 K/uL   nRBC 0.0 0.0 - 0.2 %  Basic metabolic panel     Status: Abnormal   Collection Time: 05/18/19  4:25 AM  Result Value Ref Range   Sodium 138 135 - 145 mmol/L   Potassium 3.9 3.5 - 5.1 mmol/L   Chloride 107 98 - 111 mmol/L   CO2 21 (L) 22 - 32 mmol/L   Glucose, Bld 97 70 - 99 mg/dL   BUN 28 (H) 8 - 23 mg/dL   Creatinine, Ser 1.56 (H) 0.61 - 1.24 mg/dL   Calcium 8.5 (L) 8.9 - 10.3 mg/dL   GFR calc non Af Amer 46 (L) >60 mL/min   GFR calc Af Amer 53 (L) >60 mL/min   Anion gap 10 5 - 15    No results found.  Assessment/Plan:     Principal Problem:   Gout flare Active Problems:   CKD (chronic kidney disease), stage IV (HCC)   Hypertension   Leukocytosis   Ankle joint pain   Pain in joint of left wrist   Finger pain, left   Fever  Continue antibiotics per medicine.  Rheumatology consult pending for  treatment of recurrent gout attacks.  No surgery indicated at this time.    Thornton Park , MD 05/18/2019, 1:51 PM

## 2019-05-18 NOTE — TOC Progression Note (Signed)
Transition of Care [facility]) - Progression Note    Patient Details  Name: Michael Ramsey MRN: [id number] Date of Birth: Mar 13, 1955  [facility]) CM/SW Contact  Su Hilt, RN Phone Number: 05/18/2019, 2:09 PM  Clinical Narrative:     [place] to renew insurance auth ref number [id number] New auth created  Faxed clinical to [facility] health ref number [id number]   Barriers to Discharge: Barriers Resolved  Expected Discharge Plan and Services           Expected Discharge Date: 05/14/19                                     Social Determinants of Health (SDOH) Interventions    Readmission Risk Interventions No flowsheet data found.

## 2019-05-18 NOTE — Progress Notes (Signed)
[facility]  PROGRESS NOTE  Michael Ramsey ENI:[id number] DOB: 1955-09-27 DOA: 05/11/2019 PCP: Marguerita Merles, MD Admit date - 05/11/2019   Admitting Physician Karie Kirks, DO  Outpatient Primary MD for the patient is Marguerita Merles, MD  LOS - 6 Brief Narrative   Michael Ramsey is a 64 y.o. year old male with medical history significant for gout, HTN and CKD-IV who presented on 05/11/2019 with bilateral ankle pain left wrist and left elbow pain consistent with previous gout flares.  Patient was found to have uric acid of 10.8, With white count of 19 afebrile, with CRP of 19.5.  He was given 50 mg of prednisone in the ED and continued on prednisone due to significant ankle pain limiting his ability to walk. Hospital course complicated by persistent joint pain requiring addition of colchicine to his home Uloric as well as IV Zosyn for intermittent fevers during hospital stay.  Subjective  Today reports improvement in pain in ankle/wrist/left ring finger.  Denies any drainage from left ring finger  A & P   Diffuse joint pain (bilateral ankles, wrists, left ring finger), concerning for a gout flare,, improving.  Also considering potential infectious arthritis particularly at left ring finger given noted erythema and reported drainage, xr and physical exam not consistent with abscess.  Has remained afebrile> 24 hours, white count downtrending after starting empiric vancomycin in addition to Zosyn, x-ray of left finger show soft tissue swelling. -Continue empiric  Vanc, anticipate discontinuation of/18 given no prior history of MRSA -Transition from Zosyn to Unasyn -Appreciate orthopedic recommendations, given location of left ring finger low likelihood of potential aspiration will monitor on antibiotics. -Rheumatology consulted, to establish care given history of significant gout flares despite Uloric adherence -Continue Norco, colchicine, Uloric  Leukocytosis with intermittent fevers, some  concern for potential infection, improving.  Blood cultures unremarkable, most likely source potentially being left ring finger given soft tissue swelling noted on x-ray.  White count is downtrending he has remained afebrile for greater than 48 hours -Empiric antibiotics as noted above -Monitor blood cultures, follow-up superficial finger swab -Continue to closely monitor  CKD stage IV, stable.  Peak creatinine of 2.06 during hospital stay, back to baseline. -Closely monitor BMP while on colchicine  HTN, stable -Continue Coreg, amlodipine    Family Communication  : None at bedside  Code Status : Full  Disposition Plan  :  Patient is from home. Anticipated d/c date: 2 to 3 days. Barriers to d/c or necessity for inpatient status:  IV antibiotics for ruling out potential infectious arthritis, continue pain control for potential gout flare.  Plan discharge to SNF once medically stable Consults  : Orthopedics, rheumatology  Procedures  : None  DVT Prophylaxis  :  Lovenox   Lab Results  Component Value Date   PLT 623 (H) 05/18/2019    Diet :  Diet Order            Diet Heart Room service appropriate? Yes; Fluid consistency: Thin  Diet effective now        Diet - low sodium heart healthy               Inpatient Medications Scheduled Meds: . amLODipine  10 mg Oral Daily  . carvedilol  12.5 mg Oral BID WC  . colchicine  0.6 mg Oral Daily  . enoxaparin (LOVENOX) injection  40 mg Subcutaneous Q24H  . febuxostat  40 mg Oral Daily  . loratadine  10 mg Oral  Daily   Continuous Infusions: . sodium chloride 100 mL/hr at 05/18/19 1035  . ampicillin-sulbactam (UNASYN) IV 3 g (05/18/19 1529)  . vancomycin 750 mg (05/18/19 1641)   PRN Meds:.acetaminophen, hydrALAZINE, morphine injection, ondansetron (ZOFRAN) IV, oxyCODONE-acetaminophen, senna-docusate  Antibiotics  :   Anti-infectives (From admission, onward)   Start     Dose/Rate Route Frequency Ordered Stop   05/18/19 1600   Ampicillin-Sulbactam (UNASYN) 3 g in sodium chloride 0.9 % 100 mL IVPB     3 g 200 mL/hr over 30 Minutes Intravenous Every 6 hours 05/18/19 1112     05/17/19 1600  vancomycin (VANCOCIN) IVPB 1000 mg/200 mL premix  Status:  Discontinued     1,000 mg 200 mL/hr over 60 Minutes Intravenous Every 24 hours 05/16/19 1437 05/17/19 1356   05/17/19 1600  vancomycin (VANCOREADY) IVPB 750 mg/150 mL     750 mg 150 mL/hr over 60 Minutes Intravenous Every 24 hours 05/17/19 1356     05/16/19 1600  vancomycin (VANCOCIN) IVPB 1000 mg/200 mL premix     1,000 mg 200 mL/hr over 60 Minutes Intravenous  Once 05/16/19 1418 05/16/19 1757   05/14/19 1800  piperacillin-tazobactam (ZOSYN) IVPB 3.375 g  Status:  Discontinued     3.375 g 100 mL/hr over 30 Minutes Intravenous Every 6 hours 05/14/19 1514 05/14/19 1607   05/14/19 1800  piperacillin-tazobactam (ZOSYN) IVPB 3.375 g  Status:  Discontinued     3.375 g 12.5 mL/hr over 240 Minutes Intravenous Every 8 hours 05/14/19 1607 05/18/19 1112       Objective   Vitals:   05/17/19 1535 05/18/19 0048 05/18/19 0813 05/18/19 1525  BP: (!) 152/81 (!) 157/89 (!) 153/77 (!) 141/77  Pulse: 74 75 73 78  Resp: 18 17 20    Temp: 99.5 F (37.5 C) 98 F (36.7 C) 98.5 F (36.9 C) 99.1 F (37.3 C)  TempSrc: Oral Oral Oral Oral  SpO2: 100% 97% 98% 99%  Weight:      Height:        SpO2: 99 %  Wt Readings from Last 3 Encounters:  05/11/19 79.4 kg  07/11/15 89.8 kg     Intake/Output Summary (Last 24 hours) at 05/18/2019 1830 Last data filed at 05/18/2019 1529 Gross per 24 hour  Intake 600 ml  Output 2100 ml  Net -1500 ml    Physical Exam:     Awake Alert, Oriented X 3, Normal affect No new F.N deficits,  [place].AT, Normal respiratory effort on room air, CTAB RRR,No Gallops,Rubs or new Murmurs,  +ve B.Sounds, Abd Soft, No tenderness, No rebound, guarding or rigidity. Bilateral ankles significantly less tender to palpation with minimal swelling, no overt  erythema Left ring finger with minimal tenderness, slight erythema, no fluctuance, no active drainage         I have personally reviewed the following:   Data Reviewed:  CBC Recent Labs  Lab 05/14/19 0423 05/15/19 0514 05/16/19 0505 05/17/19 0700 05/18/19 0425  WBC 15.7* 17.5* 16.4* 13.8* 13.3*  HGB 11.1* 10.5* 11.0* 10.5* 10.4*  HCT 35.5* 33.6* 35.2* 32.6* 33.1*  PLT 457* 480* 510* 602* 623*  MCV 89.0 89.1 88.9 85.8 87.8  MCH 27.8 27.9 27.8 27.6 27.6  MCHC 31.3 31.3 31.3 32.2 31.4  RDW 14.2 14.3 14.1 14.2 13.9    Chemistries  Recent Labs  Lab 05/13/19 0431 05/13/19 0431 05/14/19 0423 05/15/19 0514 05/16/19 0505 05/17/19 0700 05/18/19 0425  NA 136   < > 137 138 137 136 138  K  4.7   < > 4.6 4.2 4.4 4.1 3.9  CL 110   < > 109 108 107 106 107  CO2 19*   < > 21* 22 22 21* 21*  GLUCOSE 100*   < > 112* 103* 96 101* 97  BUN 42*   < > 38* 33* 29* 27* 28*  CREATININE 1.56*   < > 1.49* 1.52* 1.41* 1.47* 1.56*  CALCIUM 8.5*   < > 8.4* 8.2* 8.3* 8.3* 8.5*  MG 2.2  --   --   --   --   --   --   AST 39  --   --   --   --   --   --   ALT 49*  --   --   --   --   --   --   ALKPHOS 80  --   --   --   --   --   --   BILITOT 0.7  --   --   --   --   --   --    < > = values in this interval not displayed.   ------------------------------------------------------------------------------------------------------------------ No results for input(s): CHOL, HDL, LDLCALC, TRIG, CHOLHDL, LDLDIRECT in the last 72 hours.  No results found for: HGBA1C ------------------------------------------------------------------------------------------------------------------ No results for input(s): TSH, T4TOTAL, T3FREE, THYROIDAB in the last 72 hours.  Invalid input(s): FREET3 ------------------------------------------------------------------------------------------------------------------ No results for input(s): VITAMINB12, FOLATE, FERRITIN, TIBC, IRON, RETICCTPCT in the last 72  hours.  Coagulation profile No results for input(s): INR, PROTIME in the last 168 hours.  No results for input(s): DDIMER in the last 72 hours.  Cardiac Enzymes No results for input(s): CKMB, TROPONINI, MYOGLOBIN in the last 168 hours.  Invalid input(s): CK ------------------------------------------------------------------------------------------------------------------ No results found for: BNP  Micro Results Recent Results (from the past 240 hour(s))  SARS Coronavirus 2 by RT PCR (hospital order, performed in [facility] hospital lab) Nasopharyngeal Nasopharyngeal Swab     Status: None   Collection Time: 05/11/19 11:31 AM   Specimen: Nasopharyngeal Swab  Result Value Ref Range Status   SARS Coronavirus 2 NEGATIVE NEGATIVE Final    Comment: (NOTE) SARS-CoV-2 target nucleic acids are NOT DETECTED. The SARS-CoV-2 RNA is generally detectable in upper and lower respiratory specimens during the acute phase of infection. The lowest concentration of SARS-CoV-2 viral copies this assay can detect is 250 copies / mL. A negative result does not preclude SARS-CoV-2 infection and should not be used as the sole basis for treatment or other patient management decisions.  A negative result may occur with improper specimen collection / handling, submission of specimen other than nasopharyngeal swab, presence of viral mutation(s) within the areas targeted by this assay, and inadequate number of viral copies (<250 copies / mL). A negative result must be combined with clinical observations, patient history, and epidemiological information. Fact Sheet for Patients:   StrictlyIdeas.no Fact Sheet for Healthcare Providers: BankingDealers.co.za This test is not yet approved or cleared  by the [place] FDA and has been authorized for detection and/or diagnosis of SARS-CoV-2 by FDA under an Emergency Use Authorization (EUA).  This EUA will remain in  effect (meaning this test can be used) for the duration of the COVID-19 declaration under Section 564(b)(1) of the Act, 21 U.S.C. section 360bbb-3(b)(1), unless the authorization is terminated or revoked sooner. Performed at [facility], [place], [place] [address]   CULTURE, BLOOD (ROUTINE X 2) w Reflex to ID Panel  Status: None   Collection Time: 05/11/19  5:03 PM   Specimen: BLOOD  Result Value Ref Range Status   Specimen Description BLOOD LEFT ANTECUBITAL  Final   Special Requests   Final    BOTTLES DRAWN AEROBIC AND ANAEROBIC Blood Culture adequate volume   Culture   Final    NO GROWTH 5 DAYS Performed at [facility], [address] [place], [place] [address]    Report Status 05/16/2019 FINAL  Final  CULTURE, BLOOD (ROUTINE X 2) w Reflex to ID Panel     Status: None   Collection Time: 05/11/19  6:30 PM   Specimen: BLOOD  Result Value Ref Range Status   Specimen Description BLOOD RIGHT ANTECUBITAL  Final   Special Requests   Final    BOTTLES DRAWN AEROBIC AND ANAEROBIC Blood Culture adequate volume   Culture   Final    NO GROWTH 5 DAYS Performed at [facility], [address] [place], [place] [address]    Report Status 05/16/2019 FINAL  Final  SARS CORONAVIRUS 2 (TAT 6-24 HRS) Nasopharyngeal Nasopharyngeal Swab     Status: None   Collection Time: 05/14/19 11:14 AM   Specimen: Nasopharyngeal Swab  Result Value Ref Range Status   SARS Coronavirus 2 NEGATIVE NEGATIVE Final    Comment: (NOTE) SARS-CoV-2 target nucleic acids are NOT DETECTED. The SARS-CoV-2 RNA is generally detectable in upper and lower respiratory specimens during the acute phase of infection. Negative results do not preclude SARS-CoV-2 infection, do not rule out co-infections with other pathogens, and should not be used as the sole basis for treatment or other patient management decisions. Negative results must be combined with clinical  observations, patient history, and epidemiological information. The expected result is Negative. Fact Sheet for Patients: SugarRoll.be Fact Sheet for Healthcare Providers: https://www.woods-mathews.com/ This test is not yet approved or cleared by the [place] FDA and  has been authorized for detection and/or diagnosis of SARS-CoV-2 by FDA under an Emergency Use Authorization (EUA). This EUA will remain  in effect (meaning this test can be used) for the duration of the COVID-19 declaration under Section 56 4(b)(1) of the Act, 21 U.S.C. section 360bbb-3(b)(1), unless the authorization is terminated or revoked sooner. Performed at [facility], [place] [address], [place], [place] [address]   SARS Coronavirus 2 by RT PCR (hospital order, performed in [facility] hospital lab) Nasopharyngeal Nasopharyngeal Swab     Status: None   Collection Time: 05/14/19 12:40 PM   Specimen: Nasopharyngeal Swab  Result Value Ref Range Status   SARS Coronavirus 2 NEGATIVE NEGATIVE Final    Comment: (NOTE) SARS-CoV-2 target nucleic acids are NOT DETECTED. The SARS-CoV-2 RNA is generally detectable in upper and lower respiratory specimens during the acute phase of infection. The lowest concentration of SARS-CoV-2 viral copies this assay can detect is 250 copies / mL. A negative result does not preclude SARS-CoV-2 infection and should not be used as the sole basis for treatment or other patient management decisions.  A negative result may occur with improper specimen collection / handling, submission of specimen other than nasopharyngeal swab, presence of viral mutation(s) within the areas targeted by this assay, and inadequate number of viral copies (<250 copies / mL). A negative result must be combined with clinical observations, patient history, and epidemiological information. Fact Sheet for Patients:   StrictlyIdeas.no Fact  Sheet for Healthcare Providers: BankingDealers.co.za This test is not yet approved or cleared  by the [place] FDA and  has been authorized for detection and/or diagnosis of SARS-CoV-2 by FDA under an Emergency Use Authorization (EUA).  This EUA will remain in effect (meaning this test can be used) for the duration of the COVID-19 declaration under Section 564(b)(1) of the Act, 21 U.S.C. section 360bbb-3(b)(1), unless the authorization is terminated or revoked sooner. Performed at [facility], [address] [place], [place] [address]   CULTURE, BLOOD (ROUTINE X 2) w Reflex to ID Panel     Status: None (Preliminary result)   Collection Time: 05/15/19  5:13 PM   Specimen: BLOOD  Result Value Ref Range Status   Specimen Description BLOOD RIGHT ANTECUBITAL  Final   Special Requests   Final    BOTTLES DRAWN AEROBIC AND ANAEROBIC Blood Culture results may not be optimal due to an excessive volume of blood received in culture bottles   Culture   Final    NO GROWTH 3 DAYS Performed at [facility], [address] [place], [place] [address]    Report Status PENDING  Incomplete  CULTURE, BLOOD (ROUTINE X 2) w Reflex to ID Panel     Status: None (Preliminary result)   Collection Time: 05/15/19  5:19 PM   Specimen: BLOOD  Result Value Ref Range Status   Specimen Description BLOOD BLOOD RIGHT HAND  Final   Special Requests   Final    BOTTLES DRAWN AEROBIC AND ANAEROBIC Blood Culture adequate volume   Culture   Final    NO GROWTH 3 DAYS Performed at [facility], [address], [place], [place] [address]    Report Status PENDING  Incomplete  Anaerobic culture     Status: None (Preliminary result)   Collection Time: 05/15/19  7:15 PM   Specimen: Joint, Finger  Result Value Ref Range Status   Specimen Description   Final    FINGER Performed at [facility], [address] [place], [place] [address]    Special Requests    Final    Normal Performed at [facility], [address] [place], [place] [address]    Culture   Final    CULTURE REINCUBATED FOR BETTER GROWTH NO ANAEROBES ISOLATED; CULTURE IN PROGRESS FOR 5 DAYS   Report Status PENDING  Incomplete    Radiology Reports DG Chest 2 View  Result Date: 05/15/2019 CLINICAL DATA:  Fever EXAM: CHEST - 2 VIEW COMPARISON:  Radiograph 07/11/2015 FINDINGS: Bandlike atelectasis seen in the region of the right middle lobe. Lungs are otherwise clear. No consolidation, features of edema, pneumothorax, or effusion. The cardiomediastinal contours are unremarkable. No acute osseous or soft tissue abnormality. Degenerative changes are present in the imaged spine and shoulders. Multilevel flowing anterior osteophytosis, compatible with features of diffuse idiopathic skeletal hyperostosis (DISH). IMPRESSION: Bandlike atelectasis in the right middle lobe. No other acute cardiopulmonary abnormality. Electronically Signed   By: Lovena Le M.D.   On: 05/15/2019 19:54   DG Wrist 2 Views Left  Result Date: 05/15/2019 CLINICAL DATA:  Left wrist pain and swelling.  History of gout. EXAM: LEFT WRIST - 2 VIEW COMPARISON:  None. FINDINGS: No acute fracture or dislocation. Borderline widening of the scapholunate interval. No bony erosions or periostitis. Mild radiocarpal and first CMC joint space narrowing with small marginal osteophytes. Remaining joint spaces are preserved. Bone mineralization is normal. Mild soft tissue swelling about the wrist. Punctate radiopaque density in superficial radial aspect of the distal forearm. IMPRESSION: 1.  No acute osseous abnormality. 2. Borderline widening of the  scapholunate interval may reflect underlying chronic scapholunate ligament injury. 3. 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 Electronically Signed   By: Titus Dubin M.D.   On: 05/15/2019 10:09   DG Finger Ring Left  Result Date: 05/16/2019 CLINICAL DATA:   Erythema, swelling on left ring fingerPt reports his finger has been like that for "a while" but denies any injury or trauma to the finger. Pt unable to move finger very well. Redness and pus noted distally EXAM: LEFT RING FINGER 2+V COMPARISON:  None. FINDINGS: No fracture or bone lesion. There is no bone resorption to suggest osteomyelitis. Bony prominence is noted from the ulnar aspect of the head of the middle phalanx of the fourth finger. There is significant soft tissue swelling, most evident along the dorsal ulnar aspect and volar aspect of the mid finger. No radiopaque foreign body. No soft tissue air. IMPRESSION: 1. No fracture or dislocation.  No evidence of osteomyelitis. 2. Soft tissue swelling without soft tissue air or radiopaque foreign body. Electronically Signed   By: Lajean Manes M.D.   On: 05/16/2019 10:48     Time Spent in minutes  30     Desiree Hane M.D on 05/18/2019 at 6:30 PM  To page go to [email or web address] - password [facility]

## 2019-05-18 NOTE — Consult Note (Signed)
Pharmacy Antibiotic Note  Michael Ramsey is a 64 y.o. male with past medical history significant for hypertension, CKD admitted on 05/11/2019 with gout flare and cellulitis. Pharmacy has been consulted for vancomycin dosing. Patient is also on Zosyn. Wound culture from finger pending, blood cultures have been negative to date.   Patient has been intermittently febrile. Persistent leukocytosis since admission but WBC stable. Inflammatory markers elevated. Finger imaging with soft tissue swelling, no evidence of osteomyelitis.   Scr down-trending since admission (1.98 >>1.41> 1.47>1.56) but slowly creeping back up. Patient does have CKD.    Plan:  Continue vancomycin dose at current dose of 750 mg IV Q 24 hrs Goal AUC 400-550 Expected AUC: 508 SCr used: 1.56 Css (calculated): 32.0/13.6 mcg/mL  Alternatively PK with AdjBW for Vd - Ke 0.037, half life 18.6h, Vd 48.4 with Css (calculated): 26.2/11.1 mcg/mL)   Daily SCr per protocol. Watch for nephrotoxicity associated with vancomycin + Zosyn combination.  Height: 5\' 4"  (162.6 cm) Weight: 79.4 kg (175 lb) IBW/kg (Calculated) : 59.2  Temp (24hrs), Avg:98.7 F (37.1 C), Min:98 F (36.7 C), Max:99.5 F (37.5 C)  Recent Labs  Lab 05/14/19 0423 05/15/19 0514 05/16/19 0505 05/17/19 0700 05/18/19 0425  WBC 15.7* 17.5* 16.4* 13.8* 13.3*  CREATININE 1.49* 1.52* 1.41* 1.47* 1.56*    Estimated Creatinine Clearance: 44.9 mL/min (A) (by C-G formula based on SCr of 1.56 mg/dL (H)).    No Known Allergies  Antimicrobials this admission: Zosyn 5/13 >>  Vancomycin 5/15 >>   Microbiology results: 5/14 BCx: NGTD 5/14 Wound Cx (finger): normal, pending 5/10 BCx: NG final  Thank you for allowing pharmacy to be a part of this patient's care.  Rayna Sexton, PharmD, BCPS Clinical Pharmacist 05/18/2019 10:36 AM

## 2019-05-18 NOTE — Progress Notes (Signed)
Physical Therapy Treatment Patient Details Name: Michael Ramsey MRN: [id number] DOB: [birth year] Today's Date: 05/18/2019    History of Present Illness Pt. is a 64 y.o. male who was admitted to [facility] with a history of gout flares, and he reports current joint pain (b/l LEs and L UE, especially ankles) is typical for these flares.    PT Comments    Nurse reporting that pt sitting on edge of bed and needing 2nd assist to transfer to chair.  Pt able to stand with 2 assist (PT and nurse) up to RW (increased assist required to stand d/t impaired B knee flexion ROM increasing difficulty with standing technique); pt also reporting B posterior knee pain (stretching sensation) in standing.  Initially pt with difficulty taking steps but with cueing and assist for weight shifting and overall technique pt able to take steps towards chair with 2 assist and RW use (pt reporting SOB and B anterior/posterior knee pain after transfer to chair).  Nurse notified of pt's pain and request for pain meds.  Will continue to focus on strengthening, ROM, and progressive functional mobility per pt tolerance.   Follow Up Recommendations  SNF     Equipment Recommendations  Rolling walker with 5" wheels;3in1 (PT);Wheelchair (measurements PT);Wheelchair cushion (measurements PT)    Recommendations for Other Services       Precautions / Restrictions Precautions Precautions: Fall Restrictions Weight Bearing Restrictions: No    Mobility  Bed Mobility               General bed mobility comments: Deferred (pt sitting on edge of bed with nurse present upon PT arrival and pt in recliner end of session)  Transfers Overall transfer level: Needs assistance Equipment used: Rolling walker (2 wheeled) Transfers: Sit to/from Stand Sit to Stand: Mod assist;Max assist;+2 physical assistance         General transfer comment: vc's for UE/LE positioning; assist to initiate and come to full  stand  Ambulation/Gait Ambulation/Gait assistance: Mod assist;Max assist;+2 physical assistance Gait Distance (Feet): 3 Feet(bed to recliner) Assistive device: Rolling walker (2 wheeled)   Gait velocity: decreased   General Gait Details: vc's for stepping pattern, weightshifting to take steps, quad set on supportive leg while advancing opposite LE, increasing UE support on walker to improve upright posture; increased effort and time to perform   Stairs             Wheelchair Mobility    Modified Rankin (Stroke Patients Only)       Balance Overall balance assessment: Needs assistance Sitting-balance support: No upper extremity supported;Feet supported Sitting balance-Leahy Scale: Good Sitting balance - Comments: steady sitting reaching within BOS sitting edge of bed   Standing balance support: Bilateral upper extremity supported Standing balance-Leahy Scale: Fair Standing balance comment: pt requiring increased UE support on RW for static standing balance                            Cognition Arousal/Alertness: Awake/alert Behavior During Therapy: WFL for tasks assessed/performed Overall Cognitive Status: Within Functional Limits for tasks assessed                                        Exercises  Transfer training    General Comments  Pt agreeable to PT session.      Pertinent Vitals/Pain Pain Assessment: 0-10 Pain Score:  6  Pain Location: B knees (anterior and posterior) Pain Descriptors / Indicators: Sore;Discomfort Pain Intervention(s): Limited activity within patient's tolerance;Monitored during session;Repositioned;Patient requesting pain meds-RN notified  Vitals (HR and O2 on room air) stable and WFL throughout treatment session.    Home Living                      Prior Function            PT Goals (current goals can now be found in the care plan section) Acute Rehab PT Goals Patient Stated Goal: To get  stronger and go home PT Goal Formulation: With patient Time For Goal Achievement: 05/26/19 Potential to Achieve Goals: Fair Progress towards PT goals: Progressing toward goals    Frequency    Min 2X/week      PT Plan Current plan remains appropriate    Co-evaluation              AM-PAC PT "6 Clicks" Mobility   Outcome Measure  Help needed turning from your back to your side while in a flat bed without using bedrails?: A Little Help needed moving from lying on your back to sitting on the side of a flat bed without using bedrails?: A Lot Help needed moving to and from a bed to a chair (including a wheelchair)?: Total Help needed standing up from a chair using your arms (e.g., wheelchair or bedside chair)?: Total Help needed to walk in hospital room?: Total Help needed climbing 3-5 steps with a railing? : Total 6 Click Score: 9    End of Session Equipment Utilized During Treatment: Gait belt Activity Tolerance: Patient limited by pain Patient left: in chair;with call bell/phone within reach;with chair alarm set;Other (comment)(B heels floating via pillow) Nurse Communication: Mobility status;Precautions;Patient requests pain meds PT Visit Diagnosis: Muscle weakness (generalized) (M62.81);Difficulty in walking, not elsewhere classified (R26.2)     Time: 2774-1287 PT Time Calculation (min) (ACUTE ONLY): 14 min  Charges:  $Therapeutic Activity: 8-22 mins                     Leitha Bleak, PT 05/18/19, 10:51 AM

## 2019-05-19 ENCOUNTER — Inpatient Hospital Stay: Payer: Medicare Other

## 2019-05-19 DIAGNOSIS — D75839 Thrombocytosis, unspecified: Secondary | ICD-10-CM | POA: Diagnosis present

## 2019-05-19 DIAGNOSIS — R262 Difficulty in walking, not elsewhere classified: Secondary | ICD-10-CM

## 2019-05-19 DIAGNOSIS — D473 Essential (hemorrhagic) thrombocythemia: Secondary | ICD-10-CM

## 2019-05-19 DIAGNOSIS — R001 Bradycardia, unspecified: Secondary | ICD-10-CM

## 2019-05-19 LAB — CREATININE, SERUM
Creatinine, Ser: 1.48 mg/dL — ABNORMAL HIGH (ref 0.61–1.24)
GFR calc Af Amer: 57 mL/min — ABNORMAL LOW (ref >60)
GFR calc non Af Amer: 49 mL/min — ABNORMAL LOW (ref >60)

## 2019-05-19 LAB — CBC
HCT: 33.7 % — ABNORMAL LOW (ref 39.0–52.0)
Hemoglobin: 10.4 g/dL — ABNORMAL LOW (ref 13.0–17.0)
MCH: 27.7 pg (ref 26.0–34.0)
MCHC: 30.9 g/dL (ref 30.0–36.0)
MCV: 89.6 fL (ref 80.0–100.0)
Platelets: 735 10*3/uL — ABNORMAL HIGH (ref 150–400)
RBC: 3.76 MIL/uL — ABNORMAL LOW (ref 4.22–5.81)
RDW: 14 % (ref 11.5–15.5)
WBC: 13.8 10*3/uL — ABNORMAL HIGH (ref 4.0–10.5)
nRBC: 0 % (ref 0.0–0.2)

## 2019-05-19 MED ORDER — METHYLPREDNISOLONE SODIUM SUCC 40 MG IJ SOLR
40.0000 mg | Freq: Once | INTRAMUSCULAR | Status: AC
  Administered 2019-05-19: 40 mg via INTRAVENOUS
  Filled 2019-05-19: qty 1

## 2019-05-19 MED ORDER — FEBUXOSTAT 40 MG PO TABS
80.0000 mg | ORAL_TABLET | Freq: Every day | ORAL | Status: DC
  Administered 2019-05-20: 80 mg via ORAL
  Filled 2019-05-19: qty 2

## 2019-05-19 MED ORDER — PREDNISONE 10 MG PO TABS: 10.0000 mg | ORAL_TABLET | Freq: Every day | ORAL | Status: DC

## 2019-05-19 MED ORDER — PREDNISONE 20 MG PO TABS: 30.0000 mg | ORAL_TABLET | Freq: Every day | ORAL | Status: DC

## 2019-05-19 MED ORDER — PREDNISONE 20 MG PO TABS: 20.0000 mg | ORAL_TABLET | Freq: Every day | ORAL | Status: DC

## 2019-05-19 MED ORDER — FEBUXOSTAT 40 MG PO TABS
40.0000 mg | ORAL_TABLET | Freq: Once | ORAL | Status: AC
  Administered 2019-05-19: 40 mg via ORAL
  Filled 2019-05-19: qty 1

## 2019-05-19 MED ORDER — PREDNISONE 20 MG PO TABS: 40.0000 mg | ORAL_TABLET | Freq: Every day | ORAL | Status: DC

## 2019-05-19 MED ORDER — PREDNISONE 50 MG PO TABS: 50.0000 mg | ORAL_TABLET | Freq: Every day | ORAL | Status: DC

## 2019-05-19 MED ORDER — PREDNISONE 50 MG PO TABS
60.0000 mg | ORAL_TABLET | Freq: Every day | ORAL | Status: AC
  Administered 2019-05-20: 60 mg via ORAL
  Filled 2019-05-19: qty 1

## 2019-05-19 NOTE — Progress Notes (Signed)
  Subjective:  Patient states he is not having significant pain in his joints. He denies any drainage from the left ring finger.  Objective:   VITALS:   Vitals:   05/18/19 0813 05/18/19 1525 05/18/19 2316 05/19/19 0757  BP: (!) 153/77 (!) 141/77 (!) 150/68 (!) 159/84  Pulse: 73 78 75 74  Resp: 20  18 20   Temp: 98.5 F (36.9 C) 99.1 F (37.3 C) 99.8 F (37.7 C) 98.9 F (37.2 C)  TempSrc: Oral Oral Oral   SpO2: 98% 99% 98% 98%  Weight:      Height:        PHYSICAL EXAM: Left ring finger: Patient has tophi over the dorsal DIP joint. Erythema has improved. There is no drainage. He remains neurovascular intact. Patient has active range of motion of the left DIP joint without pain.  LABS  Results for orders placed or performed during the hospital encounter of 05/11/19 (from the past 24 hour(s))  SARS CORONAVIRUS 2 (TAT 6-24 HRS) Nasopharyngeal Nasopharyngeal Swab     Status: None   Collection Time: 05/18/19  3:32 PM   Specimen: Nasopharyngeal Swab  Result Value Ref Range   SARS Coronavirus 2 NEGATIVE NEGATIVE  Creatinine, serum     Status: Abnormal   Collection Time: 05/19/19  4:35 AM  Result Value Ref Range   Creatinine, Ser 1.48 (H) 0.61 - 1.24 mg/dL   GFR calc non Af Amer 49 (L) >60 mL/min   GFR calc Af Amer 57 (L) >60 mL/min  CBC     Status: Abnormal   Collection Time: 05/19/19  4:35 AM  Result Value Ref Range   WBC 13.8 (H) 4.0 - 10.5 K/uL   RBC 3.76 (L) 4.22 - 5.81 MIL/uL   Hemoglobin 10.4 (L) 13.0 - 17.0 g/dL   HCT 33.7 (L) 39.0 - 52.0 %   MCV 89.6 80.0 - 100.0 fL   MCH 27.7 26.0 - 34.0 pg   MCHC 30.9 30.0 - 36.0 g/dL   RDW 14.0 11.5 - 15.5 %   Platelets 735 (H) 150 - 400 K/uL   nRBC 0.0 0.0 - 0.2 %    No results found.  Assessment/Plan:     Principal Problem:   Gout flare Active Problems:   CKD (chronic kidney disease), stage IV (HCC)   Hypertension   Leukocytosis   Ankle joint pain   Pain in joint of left wrist   Finger pain, left    Fever  Continue antibiotics per hospitalist. Rheumatology consult pending. No surgical intervention required at this time. Will sign off for now.    Thornton Park , MD 05/19/2019, 9:23 AM

## 2019-05-19 NOTE — TOC Progression Note (Signed)
Transition of Care [facility]) - Progression Note    Patient Details  Name: Michael Ramsey MRN: [id number] Date of Birth: 04-01-1955  Transition of Care [facility]) CM/SW [place], RN Phone Number: 05/19/2019, 11:48 AM  Clinical Narrative:    Received call from [facility] with [place] Ref number [phone], start date 5/18 next review date 5/20 coordinator not assigned yet, notified physician     Barriers to Discharge: Barriers Resolved  Expected Discharge Plan and Services           Expected Discharge Date: 05/14/19                                     Social Determinants of Health (SDOH) Interventions    Readmission Risk Interventions No flowsheet data found.

## 2019-05-19 NOTE — Progress Notes (Signed)
[facility]  PROGRESS NOTE  Michael Ramsey ZOX:[id number] DOB: Jan 10, 1955 DOA: 05/11/2019 PCP: Marguerita Merles, MD Admit date - 05/11/2019   Admitting Physician Karie Kirks, DO  Outpatient Primary MD for the patient is Marguerita Merles, MD  LOS - 7 Brief Narrative   Michael Ramsey is a 64 y.o. year old male with medical history significant for gout, HTN and CKD-IV who presented on 05/11/2019 with bilateral ankle pain left wrist and left elbow pain consistent with previous gout flares.  Patient was found to have uric acid of 10.8, With white count of 19 afebrile, with CRP of 19.5.  He was given 50 mg of prednisone in the ED and continued on prednisone due to significant ankle pain limiting his ability to walk. Hospital course complicated by persistent joint pain requiring addition of colchicine to his home Uloric as well as IV Zosyn and vancomycin for intermittent fevers and concern for possible infected left finger DIP during hospital stay.  Subjective  Today reports improvement in pain in ankle/wrist/left ring finger.  Continues to deny drainage from left ring finger  A & P   Diffuse joint pain (bilateral ankles, wrists, left ring finger), concerning for a gout flare,, improving.  Initial concern for infected DIP joint of left finger has completed 5 days of Zosyn, vancomycin.  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  Given has remained afebrile for greater than 48 hours and blood cultures unremarkable  -Discontinue Unasyn and Vanco -Appreciate orthopedic recommendations, given location of left ring finger low likelihood of potential aspiration will monitor on antibiotics. -Rheumatology consulted, appreciate recommendations -Increase Uloric from 40 mg to 80 -IV Solu-Medrol 40 mg x 1 followed by prednisone taper, to achieve goal uric acid less than 5 (to be followed as outpatient) -X-ray bilateral knees and ankles -PT recommends SNF, unable to ambulate due to  severity of pain  Leukocytosis with intermittent fevers, some concern for potential infection, improving.  Blood cultures unremarkable, most likely source potentially being left ring finger given soft tissue swelling noted on x-ray.  White count is downtrending he has remained afebrile for greater than 48 hours -Empiric antibiotics continued as noted above -Monitor blood cultures (continue to show no growth), follow-up superficial finger swab -Continue to closely monitor  CKD stage IV, stable.  Peak creatinine of 2.06 during hospital stay, back to baseline. -Closely monitor BMP while on colchicine  HTN, stable -Continue Coreg, amlodipine  Thrombocytosis.  Suspect reactive in the setting of gout flare.  Unclear baseline.  Also on differential essential thrombocytosis that could cause elevated uric acid in the 90s for gout flares -Monitor CBCs    Family Communication  : None at bedside  Code Status : Full  Disposition Plan  :  Patient is from home. Anticipated d/c date: 2 to 3 days. Barriers to d/c or necessity for inpatient status:  Discontinue IV antibiotics and monitor fever curve/white count.  Give IV Solu-Medrol followed by prednisone taper and monitor joint pain, x-ray of ankles/knees, plan discharge to SNF once medically stable Consults  : Orthopedics, rheumatology  Procedures  : None  DVT Prophylaxis  :  Lovenox   Lab Results  Component Value Date   PLT 735 (H) 05/19/2019    Diet :  Diet Order            Diet Heart Room service appropriate? Yes; Fluid consistency: Thin  Diet effective now        Diet - low sodium  heart healthy               Inpatient Medications Scheduled Meds: . amLODipine  10 mg Oral Daily  . carvedilol  12.5 mg Oral BID WC  . colchicine  0.6 mg Oral Daily  . enoxaparin (LOVENOX) injection  40 mg Subcutaneous Q24H  . [START ON 05/20/2019] febuxostat  80 mg Oral Daily  . loratadine  10 mg Oral Daily  . [START ON 05/20/2019] predniSONE  60 mg  Oral Q breakfast   Followed by  . [START ON 05/21/2019] predniSONE  50 mg Oral Q breakfast   Followed by  . [START ON 05/22/2019] predniSONE  40 mg Oral Q breakfast   Followed by  . [START ON 05/23/2019] predniSONE  30 mg Oral Q breakfast   Followed by  . [START ON 05/24/2019] predniSONE  20 mg Oral Q breakfast   Followed by  . [START ON 05/25/2019] predniSONE  10 mg Oral Q breakfast   Continuous Infusions:  PRN Meds:.acetaminophen, hydrALAZINE, morphine injection, ondansetron (ZOFRAN) IV, oxyCODONE-acetaminophen, senna-docusate  Antibiotics  :   Anti-infectives (From admission, onward)   Start     Dose/Rate Route Frequency Ordered Stop   05/18/19 1600  Ampicillin-Sulbactam (UNASYN) 3 g in sodium chloride 0.9 % 100 mL IVPB  Status:  Discontinued     3 g 200 mL/hr over 30 Minutes Intravenous Every 6 hours 05/18/19 1112 05/19/19 1321   05/17/19 1600  vancomycin (VANCOCIN) IVPB 1000 mg/200 mL premix  Status:  Discontinued     1,000 mg 200 mL/hr over 60 Minutes Intravenous Every 24 hours 05/16/19 1437 05/17/19 1356   05/17/19 1600  vancomycin (VANCOREADY) IVPB 750 mg/150 mL  Status:  Discontinued     750 mg 150 mL/hr over 60 Minutes Intravenous Every 24 hours 05/17/19 1356 05/19/19 0722   05/16/19 1600  vancomycin (VANCOCIN) IVPB 1000 mg/200 mL premix     1,000 mg 200 mL/hr over 60 Minutes Intravenous  Once 05/16/19 1418 05/16/19 1757   05/14/19 1800  piperacillin-tazobactam (ZOSYN) IVPB 3.375 g  Status:  Discontinued     3.375 g 100 mL/hr over 30 Minutes Intravenous Every 6 hours 05/14/19 1514 05/14/19 1607   05/14/19 1800  piperacillin-tazobactam (ZOSYN) IVPB 3.375 g  Status:  Discontinued     3.375 g 12.5 mL/hr over 240 Minutes Intravenous Every 8 hours 05/14/19 1607 05/18/19 1112       Objective   Vitals:   05/18/19 2316 05/19/19 0757 05/19/19 1539 05/19/19 1545  BP: (!) 150/68 (!) 159/84 (!) 159/77   Pulse: 75 74 (!) 42 83  Resp: 18 20 20    Temp: 99.8 F (37.7 C) 98.9 F  (37.2 C) 98.2 F (36.8 C)   TempSrc: Oral     SpO2: 98% 98% 97%   Weight:      Height:        SpO2: 97 %  Wt Readings from Last 3 Encounters:  05/11/19 79.4 kg  07/11/15 89.8 kg     Intake/Output Summary (Last 24 hours) at 05/19/2019 1906 Last data filed at 05/19/2019 1850 Gross per 24 hour  Intake 0 ml  Output 2100 ml  Net -2100 ml    Physical Exam:     Awake Alert, Oriented X 3, Normal affect No new F.N deficits,  [place].AT, Normal respiratory effort on room air, CTAB RRR,No Gallops,Rubs or new Murmurs,  +ve B.Sounds, Abd Soft, No tenderness, No rebound, guarding or rigidity. Bilateral ankles significantly less tender to palpation with minimal  swelling, no overt erythema Left ring finger with minimal tenderness, slight erythema, no fluctuance, no active drainage         I have personally reviewed the following:   Data Reviewed:  CBC Recent Labs  Lab 05/15/19 0514 05/16/19 0505 05/17/19 0700 05/18/19 0425 05/19/19 0435  WBC 17.5* 16.4* 13.8* 13.3* 13.8*  HGB 10.5* 11.0* 10.5* 10.4* 10.4*  HCT 33.6* 35.2* 32.6* 33.1* 33.7*  PLT 480* 510* 602* 623* 735*  MCV 89.1 88.9 85.8 87.8 89.6  MCH 27.9 27.8 27.6 27.6 27.7  MCHC 31.3 31.3 32.2 31.4 30.9  RDW 14.3 14.1 14.2 13.9 14.0    Chemistries  Recent Labs  Lab 05/13/19 0431 05/13/19 0431 05/14/19 0423 05/14/19 0423 05/15/19 0514 05/16/19 0505 05/17/19 0700 05/18/19 0425 05/19/19 0435  NA 136   < > 137  --  138 137 136 138  --   K 4.7   < > 4.6  --  4.2 4.4 4.1 3.9  --   CL 110   < > 109  --  108 107 106 107  --   CO2 19*   < > 21*  --  22 22 21* 21*  --   GLUCOSE 100*   < > 112*  --  103* 96 101* 97  --   BUN 42*   < > 38*  --  33* 29* 27* 28*  --   CREATININE 1.56*   < > 1.49*   < > 1.52* 1.41* 1.47* 1.56* 1.48*  CALCIUM 8.5*   < > 8.4*  --  8.2* 8.3* 8.3* 8.5*  --   MG 2.2  --   --   --   --   --   --   --   --   AST 39  --   --   --   --   --   --   --   --   ALT 49*  --   --   --   --    --   --   --   --   ALKPHOS 80  --   --   --   --   --   --   --   --   BILITOT 0.7  --   --   --   --   --   --   --   --    < > = values in this interval not displayed.   ------------------------------------------------------------------------------------------------------------------ No results for input(s): CHOL, HDL, LDLCALC, TRIG, CHOLHDL, LDLDIRECT in the last 72 hours.  No results found for: HGBA1C ------------------------------------------------------------------------------------------------------------------ No results for input(s): TSH, T4TOTAL, T3FREE, THYROIDAB in the last 72 hours.  Invalid input(s): FREET3 ------------------------------------------------------------------------------------------------------------------ No results for input(s): VITAMINB12, FOLATE, FERRITIN, TIBC, IRON, RETICCTPCT in the last 72 hours.  Coagulation profile No results for input(s): INR, PROTIME in the last 168 hours.  No results for input(s): DDIMER in the last 72 hours.  Cardiac Enzymes No results for input(s): CKMB, TROPONINI, MYOGLOBIN in the last 168 hours.  Invalid input(s): CK ------------------------------------------------------------------------------------------------------------------ No results found for: BNP  Micro Results Recent Results (from the past 240 hour(s))  SARS Coronavirus 2 by RT PCR (hospital order, performed in [facility] hospital lab) Nasopharyngeal Nasopharyngeal Swab     Status: None   Collection Time: 05/11/19 11:31 AM   Specimen: Nasopharyngeal Swab  Result Value Ref Range Status   SARS Coronavirus 2 NEGATIVE NEGATIVE Final    Comment: (NOTE) SARS-CoV-2 target nucleic  acids are NOT DETECTED. The SARS-CoV-2 RNA is generally detectable in upper and lower respiratory specimens during the acute phase of infection. The lowest concentration of SARS-CoV-2 viral copies this assay can detect is 250 copies / mL. A negative result does not preclude SARS-CoV-2  infection and should not be used as the sole basis for treatment or other patient management decisions.  A negative result may occur with improper specimen collection / handling, submission of specimen other than nasopharyngeal swab, presence of viral mutation(s) within the areas targeted by this assay, and inadequate number of viral copies (<250 copies / mL). A negative result must be combined with clinical observations, patient history, and epidemiological information. Fact Sheet for Patients:   StrictlyIdeas.no Fact Sheet for Healthcare Providers: BankingDealers.co.za This test is not yet approved or cleared  by the [place] FDA and has been authorized for detection and/or diagnosis of SARS-CoV-2 by FDA under an Emergency Use Authorization (EUA).  This EUA will remain in effect (meaning this test can be used) for the duration of the COVID-19 declaration under Section 564(b)(1) of the Act, 21 U.S.C. section 360bbb-3(b)(1), unless the authorization is terminated or revoked sooner. Performed at [facility], [address] [place], [place] [address]   CULTURE, BLOOD (ROUTINE X 2) w Reflex to ID Panel     Status: None   Collection Time: 05/11/19  5:03 PM   Specimen: BLOOD  Result Value Ref Range Status   Specimen Description BLOOD LEFT ANTECUBITAL  Final   Special Requests   Final    BOTTLES DRAWN AEROBIC AND ANAEROBIC Blood Culture adequate volume   Culture   Final    NO GROWTH 5 DAYS Performed at [facility], [address] [place], [place] [address]    Report Status 05/16/2019 FINAL  Final  CULTURE, BLOOD (ROUTINE X 2) w Reflex to ID Panel     Status: None   Collection Time: 05/11/19  6:30 PM   Specimen: BLOOD  Result Value Ref Range Status   Specimen Description BLOOD RIGHT ANTECUBITAL  Final   Special Requests   Final    BOTTLES DRAWN AEROBIC AND ANAEROBIC Blood Culture adequate volume   Culture    Final    NO GROWTH 5 DAYS Performed at [facility], [address] [place], [place] [address]    Report Status 05/16/2019 FINAL  Final  SARS CORONAVIRUS 2 (TAT 6-24 HRS) Nasopharyngeal Nasopharyngeal Swab     Status: None   Collection Time: 05/14/19 11:14 AM   Specimen: Nasopharyngeal Swab  Result Value Ref Range Status   SARS Coronavirus 2 NEGATIVE NEGATIVE Final    Comment: (NOTE) SARS-CoV-2 target nucleic acids are NOT DETECTED. The SARS-CoV-2 RNA is generally detectable in upper and lower respiratory specimens during the acute phase of infection. Negative results do not preclude SARS-CoV-2 infection, do not rule out co-infections with other pathogens, and should not be used as the sole basis for treatment or other patient management decisions. Negative results must be combined with clinical observations, patient history, and epidemiological information. The expected result is Negative. Fact Sheet for Patients: SugarRoll.be Fact Sheet for Healthcare Providers: https://www.woods-mathews.com/ This test is not yet approved or cleared by the [place] FDA and  has been authorized for detection and/or diagnosis of SARS-CoV-2 by FDA under an Emergency Use Authorization (EUA). This EUA will remain  in effect (meaning this test can be used) for the duration of the COVID-19 declaration under Section 56 4(b)(1) of the Act, 21  U.S.C. section 360bbb-3(b)(1), unless the authorization is terminated or revoked sooner. Performed at [facility], [place] [address], [place], [place] [address]   SARS Coronavirus 2 by RT PCR (hospital order, performed in [facility] hospital lab) Nasopharyngeal Nasopharyngeal Swab     Status: None   Collection Time: 05/14/19 12:40 PM   Specimen: Nasopharyngeal Swab  Result Value Ref Range Status   SARS Coronavirus 2 NEGATIVE NEGATIVE Final    Comment: (NOTE) SARS-CoV-2 target nucleic acids are NOT  DETECTED. The SARS-CoV-2 RNA is generally detectable in upper and lower respiratory specimens during the acute phase of infection. The lowest concentration of SARS-CoV-2 viral copies this assay can detect is 250 copies / mL. A negative result does not preclude SARS-CoV-2 infection and should not be used as the sole basis for treatment or other patient management decisions.  A negative result may occur with improper specimen collection / handling, submission of specimen other than nasopharyngeal swab, presence of viral mutation(s) within the areas targeted by this assay, and inadequate number of viral copies (<250 copies / mL). A negative result must be combined with clinical observations, patient history, and epidemiological information. Fact Sheet for Patients:   StrictlyIdeas.no Fact Sheet for Healthcare Providers: BankingDealers.co.za This test is not yet approved or cleared  by the [place] FDA and has been authorized for detection and/or diagnosis of SARS-CoV-2 by FDA under an Emergency Use Authorization (EUA).  This EUA will remain in effect (meaning this test can be used) for the duration of the COVID-19 declaration under Section 564(b)(1) of the Act, 21 U.S.C. section 360bbb-3(b)(1), unless the authorization is terminated or revoked sooner. Performed at [facility], [address] [place], [place] [address]   CULTURE, BLOOD (ROUTINE X 2) w Reflex to ID Panel     Status: None (Preliminary result)   Collection Time: 05/15/19  5:13 PM   Specimen: BLOOD  Result Value Ref Range Status   Specimen Description BLOOD RIGHT ANTECUBITAL  Final   Special Requests   Final    BOTTLES DRAWN AEROBIC AND ANAEROBIC Blood Culture results may not be optimal due to an excessive volume of blood received in culture bottles   Culture   Final    NO GROWTH 4 DAYS Performed at [facility], [address], [place], [place]  [address]    Report Status PENDING  Incomplete  CULTURE, BLOOD (ROUTINE X 2) w Reflex to ID Panel     Status: None (Preliminary result)   Collection Time: 05/15/19  5:19 PM   Specimen: BLOOD  Result Value Ref Range Status   Specimen Description BLOOD BLOOD RIGHT HAND  Final   Special Requests   Final    BOTTLES DRAWN AEROBIC AND ANAEROBIC Blood Culture adequate volume   Culture   Final    NO GROWTH 4 DAYS Performed at [facility], [address] [place], [place] [address]    Report Status PENDING  Incomplete  Anaerobic culture     Status: None (Preliminary result)   Collection Time: 05/15/19  7:15 PM   Specimen: Joint, Finger  Result Value Ref Range Status   Specimen Description   Final    FINGER Performed at [facility], [address] [place], [place] [address]    Special Requests   Final    Normal Performed at [facility], [address], [place], [place] [address]    Culture   Final    CULTURE REINCUBATED FOR BETTER GROWTH  NO ANAEROBES ISOLATED; CULTURE IN PROGRESS FOR 5 DAYS   Report Status PENDING  Incomplete  SARS CORONAVIRUS 2 (TAT 6-24 HRS) Nasopharyngeal Nasopharyngeal Swab     Status: None   Collection Time: 05/18/19  3:32 PM   Specimen: Nasopharyngeal Swab  Result Value Ref Range Status   SARS Coronavirus 2 NEGATIVE NEGATIVE Final    Comment: (NOTE) SARS-CoV-2 target nucleic acids are NOT DETECTED. The SARS-CoV-2 RNA is generally detectable in upper and lower respiratory specimens during the acute phase of infection. Negative results do not preclude SARS-CoV-2 infection, do not rule out co-infections with other pathogens, and should not be used as the sole basis for treatment or other patient management decisions. Negative results must be combined with clinical observations, patient history, and epidemiological information. The expected result is Negative. Fact Sheet for Patients: SugarRoll.be Fact  Sheet for Healthcare Providers: https://www.woods-mathews.com/ This test is not yet approved or cleared by the [place] FDA and  has been authorized for detection and/or diagnosis of SARS-CoV-2 by FDA under an Emergency Use Authorization (EUA). This EUA will remain  in effect (meaning this test can be used) for the duration of the COVID-19 declaration under Section 56 4(b)(1) of the Act, 21 U.S.C. section 360bbb-3(b)(1), unless the authorization is terminated or revoked sooner. Performed at [facility], [place] [address] [place], [place] [address]     Radiology Reports DG Chest 2 View  Result Date: 05/15/2019 CLINICAL DATA:  Fever EXAM: CHEST - 2 VIEW COMPARISON:  Radiograph 07/11/2015 FINDINGS: Bandlike atelectasis seen in the region of the right middle lobe. Lungs are otherwise clear. No consolidation, features of edema, pneumothorax, or effusion. The cardiomediastinal contours are unremarkable. No acute osseous or soft tissue abnormality. Degenerative changes are present in the imaged spine and shoulders. Multilevel flowing anterior osteophytosis, compatible with features of diffuse idiopathic skeletal hyperostosis (DISH). IMPRESSION: Bandlike atelectasis in the right middle lobe. No other acute cardiopulmonary abnormality. Electronically Signed   By: Lovena Le M.D.   On: 05/15/2019 19:54   DG Wrist 2 Views Left  Result Date: 05/15/2019 CLINICAL DATA:  Left wrist pain and swelling.  History of gout. EXAM: LEFT WRIST - 2 VIEW COMPARISON:  None. FINDINGS: No acute fracture or dislocation. Borderline widening of the scapholunate interval. No bony erosions or periostitis. Mild radiocarpal and first CMC joint space narrowing with small marginal osteophytes. Remaining joint spaces are preserved. Bone mineralization is normal. Mild soft tissue swelling about the wrist. Punctate radiopaque density in superficial radial aspect of the distal forearm. IMPRESSION: 1.  No acute  osseous abnormality. 2. Borderline widening of the scapholunate interval may reflect underlying chronic scapholunate ligament injury. 3. 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 Electronically Signed   By: Titus Dubin M.D.   On: 05/15/2019 10:09   DG Knee 1-2 Views Left  Result Date: 05/19/2019 CLINICAL DATA:  Gout and renal failure.  Knee pain. EXAM: LEFT KNEE - 1-2 VIEW COMPARISON:  None. FINDINGS: Moderate-sized joint effusion. No joint space narrowing. Small medial compartment osteophytes. No focal bone finding. IMPRESSION: Moderate joint effusion. Mild medial compartment degenerative spurring. Electronically Signed   By: Nelson Chimes M.D.   On: 05/19/2019 14:15   DG Knee 1-2 Views Right  Result Date: 05/19/2019 CLINICAL DATA:  Gout and renal failure.  Knee pain. EXAM: RIGHT KNEE - 1-2 VIEW COMPARISON:  None. FINDINGS: Knee joint effusion. No evidence of joint space narrowing. Small medial compartment osteophytes. No other focal bone finding. IMPRESSION: Joint effusion.  Minimal spurring of the medial compartment. Electronically Signed   By: Nelson Chimes M.D.   On: 05/19/2019 14:15   DG Ankle 2 Views Left  Result Date: 05/19/2019 CLINICAL DATA:  History of gout and renal failure.  Ankle pain. EXAM: LEFT ANKLE - 2 VIEW COMPARISON:  None FINDINGS: Ankle joint itself appears normal. 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 As seen on the other side, the anterior process of the talus is diminutive. Given the symmetric nature, this is probably congenital. No evidence of erosions to suggest gout. No acute fracture. IMPRESSION: Midfoot degenerative change and fusion. See above discussion. No sign of gout. Electronically Signed   By: Nelson Chimes M.D.   On: 05/19/2019 14:14   DG Ankle 2 Views Right  Result Date: 05/19/2019 CLINICAL DATA:  History of gout and renal failure.  Ankle pain. EXAM: RIGHT ANKLE - 2 VIEW COMPARISON:  None. FINDINGS: Osteoarthritis of the ankle joint with some joint  space narrowing and marginal osteophytes. Subtalar joint also shows some degenerative changes. Diminutive anterior process of the talus could be congenital or relate to old trauma. Regional soft tissue swelling. No sign of acute fracture. No evidence of erosions. IMPRESSION: Degenerative changes of the ankle joint and subtalar joint. Diminutive anterior process of the talus that could be congenital or relate to old trauma. No sign of erosive arthritis/gout by radiography. Electronically Signed   By: Nelson Chimes M.D.   On: 05/19/2019 14:13   DG Finger Ring Left  Result Date: 05/16/2019 CLINICAL DATA:  Erythema, swelling on left ring fingerPt reports his finger has been like that for "a while" but denies any injury or trauma to the finger. Pt unable to move finger very well. Redness and pus noted distally EXAM: LEFT RING FINGER 2+V COMPARISON:  None. FINDINGS: No fracture or bone lesion. There is no bone resorption to suggest osteomyelitis. Bony prominence is noted from the ulnar aspect of the head of the middle phalanx of the fourth finger. There is significant soft tissue swelling, most evident along the dorsal ulnar aspect and volar aspect of the mid finger. No radiopaque foreign body. No soft tissue air. IMPRESSION: 1. No fracture or dislocation.  No evidence of osteomyelitis. 2. Soft tissue swelling without soft tissue air or radiopaque foreign body. Electronically Signed   By: Lajean Manes M.D.   On: 05/16/2019 10:48     Time Spent in minutes  30     Desiree Hane M.D on 05/19/2019 at 7:06 PM  To page go to [email or web address] - password [facility]

## 2019-05-19 NOTE — Progress Notes (Signed)
Patients Pulse 42 per dynamap , this writer checked radial pulse it is 83 . +2 , and feels irregular. No history of A. fibb will notify provider

## 2019-05-19 NOTE — Consult Note (Signed)
Reason for Consult: Gout  Referring Physician: Hospitalist  Michael Ramsey   HPI: 64 year old white male.  History of hypertension and renal insufficiency.  Long history of gout.  Started with he was 20.  Episodic flares.  Usually treated with Indocin.  He has a remote kidney stone.  Positive family history of gout in father.  No significant alcohol history Claims to been on Uloric for about 10 years.  He has had elbow nodules for several years.  Episodic gout flares usually in feet and hands or knees.  About 2 weeks ago he began with significant pain in both ankles.  Wrists.  Elbows.  Was admitted.  Had leukocytosis and mild thrombocytosis.  Creatinine was over 2.  Was given 2 days of prednisone and then placed on colchicine 1 a day and analgesics.  Had episode of fevers.  Did develop swelling in the left fourth finger.  Some drainage.  Concerned about infection.  Was placed on antibiotics and vancomycin. Uric acid 10.8.  Repeat white count 13,000 platelets up to 735,000.  Creatinine down to 1.5.  Still having a good deal of pain in both ankles.  Now knees.  Some elbows.  Less so left wrist.  PMH: Hypertension gout.  CKD.  SURGICAL HISTORY: No joint surgeries  Family History: Gout in  father  Social History: No significant alcohol.  Denies moonshine  Allergies: No Known Allergies  Medications:  Scheduled: . amLODipine  10 mg Oral Daily  . carvedilol  12.5 mg Oral BID WC  . colchicine  0.6 mg Oral Daily  . enoxaparin (LOVENOX) injection  40 mg Subcutaneous Q24H  . [START ON 05/20/2019] febuxostat  80 mg Oral Daily  . loratadine  10 mg Oral Daily  . [START ON 05/20/2019] predniSONE  60 mg Oral Q breakfast   Followed by  . [START ON 05/21/2019] predniSONE  50 mg Oral Q breakfast   Followed by  . [START ON 05/22/2019] predniSONE  40 mg Oral Q breakfast   Followed by  . [START ON 05/23/2019] predniSONE  30 mg Oral Q breakfast   Followed by  . [START ON 05/24/2019] predniSONE  20 mg Oral Q  breakfast   Followed by  . [START ON 05/25/2019] predniSONE  10 mg Oral Q breakfast        ROS: No chest pain.  No abdominal pain.  No shortness of breath.   PHYSICAL EXAM: Blood pressure (!) 159/77, pulse 83, temperature 98.2 F (36.8 C), resp. rate 20, height 5\' 4"  (1.626 m), weight 79.4 kg, SpO2 97 %. Pleasant male.  Seen in the bed.  Pain with moving his ankles and knees.  No ear tophi.  Sclera clear.  Shoulders move well.  Mild pain with flexion left wrist but no optically swollen right wrist moves well.  Bilateral olecranon bursa swelling consistent with tophaceous disease.  He has swelling and drainage of the left ring finger.  Has been lanced.  Hips move well.  Bilateral knee effusions.  [place] decreased range of motion both ankles with pain.  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  Not a good candidate for nonsteroidals or colchicine Significant decreased range of motion of his ankles.  Rule out midfoot degenerative changes Thrombocytosis.  Suspect inflammatory though do not have platelet count prior to this illness.  If he had chronic elevated platelets would worry about essential thrombocytosis and elevated uric  acid secondary to that  Recommendations: IV Solu-Medrol x1 Prednisone taper Boost Uloric to 80.  Target uric acid would be below 5 with his tophaceous disease X-ray both knees and both ankles May eventually be candidate for Pegloticase IV if unable to lower uric acid with oral agents.  Happy to see in follow-up as outpatient  Emmaline Kluver 05/19/2019, 5:57 PM

## 2019-05-20 LAB — CULTURE, BLOOD (ROUTINE X 2)
Culture: NO GROWTH
Culture: NO GROWTH
Special Requests: ADEQUATE

## 2019-05-20 LAB — CBC WITH DIFFERENTIAL/PLATELET
Abs Immature Granulocytes: 0.26 10*3/uL — ABNORMAL HIGH (ref 0.00–0.07)
Basophils Absolute: 0 10*3/uL (ref 0.0–0.1)
Basophils Relative: 0 %
Eosinophils Absolute: 0.9 10*3/uL — ABNORMAL HIGH (ref 0.0–0.5)
Eosinophils Relative: 5 %
HCT: 34.8 % — ABNORMAL LOW (ref 39.0–52.0)
Hemoglobin: 11 g/dL — ABNORMAL LOW (ref 13.0–17.0)
Immature Granulocytes: 2 %
Lymphocytes Relative: 4 %
Lymphs Abs: 0.7 10*3/uL (ref 0.7–4.0)
MCH: 27.6 pg (ref 26.0–34.0)
MCHC: 31.6 g/dL (ref 30.0–36.0)
MCV: 87.4 fL (ref 80.0–100.0)
Monocytes Absolute: 0.6 10*3/uL (ref 0.1–1.0)
Monocytes Relative: 4 %
Neutro Abs: 14.6 10*3/uL — ABNORMAL HIGH (ref 1.7–7.7)
Neutrophils Relative %: 85 %
Platelets: 858 10*3/uL — ABNORMAL HIGH (ref 150–400)
RBC: 3.98 MIL/uL — ABNORMAL LOW (ref 4.22–5.81)
RDW: 13.6 % (ref 11.5–15.5)
Smear Review: NORMAL
WBC: 17 10*3/uL — ABNORMAL HIGH (ref 4.0–10.5)
nRBC: 0 % (ref 0.0–0.2)

## 2019-05-20 LAB — BASIC METABOLIC PANEL
Anion gap: 10 (ref 5–15)
BUN: 37 mg/dL — ABNORMAL HIGH (ref 8–23)
CO2: 22 mmol/L (ref 22–32)
Calcium: 8.8 mg/dL — ABNORMAL LOW (ref 8.9–10.3)
Chloride: 105 mmol/L (ref 98–111)
Creatinine, Ser: 1.27 mg/dL — ABNORMAL HIGH (ref 0.61–1.24)
GFR calc Af Amer: >60 mL/min (ref >60)
GFR calc non Af Amer: 59 mL/min — ABNORMAL LOW (ref >60)
Glucose, Bld: 131 mg/dL — ABNORMAL HIGH (ref 70–99)
Potassium: 4.3 mmol/L (ref 3.5–5.1)
Sodium: 137 mmol/L (ref 135–145)

## 2019-05-20 LAB — MAGNESIUM: Magnesium: 2.3 mg/dL (ref 1.7–2.4)

## 2019-05-20 MED ORDER — FEBUXOSTAT 40 MG PO TABS: 80.0000 mg | ORAL_TABLET | Freq: Every day | ORAL | 0 refills | Status: AC

## 2019-05-20 MED ORDER — PREDNISONE 10 MG PO TABS: ORAL_TABLET | ORAL | 0 refills | Status: AC

## 2019-05-20 MED ORDER — PREDNISONE 10 MG PO TABS: ORAL_TABLET | ORAL | 0 refills | Status: DC

## 2019-05-20 NOTE — Progress Notes (Signed)
Patient left facility with EMS , VSS . IV removed from Left hand

## 2019-05-20 NOTE — Discharge Summary (Addendum)
[facility] Discharge Summary   Patient: Michael Ramsey CVE:[id number]  PCP: Marguerita Merles, MD  Date of admission: 05/11/2019   Date of discharge:  05/20/2019     Discharge Diagnoses:   Principal Problem:   Gout flare Active Problems:   CKD (chronic kidney disease), stage IV (HCC)   Hypertension   Leukocytosis   Ankle joint pain   Pain in joint of left wrist   Finger pain, left   Fever   Thrombocytosis ([place])   Admitted From: Home Disposition:  SNF   Recommendations for Outpatient Follow-up:  1. PCP: In 1 week for discharge, patient should be seen by physician at the facility in few days 2. Follow up LABS/TEST: Repeat CBC and BMP after 1 week   Contact information for follow-up providers    Emmaline Kluver., MD.   Specialty: Rheumatology Contact information: [place] [address] [phone]        Marguerita Merles, MD. Schedule an appointment as soon as possible for a visit.   Specialty: Family Medicine Contact information: [place] [address] [phone]            Contact information for after-discharge care    Destination    [facility] Preferred SNF .   Service: Skilled Nursing Contact information: [address] [place] [phone]                 Diet recommendation: Cardiac diet  Activity: The patient is advised to gradually reintroduce usual activities, as tolerated  Discharge Condition: stable  Code Status: Full code   History of present illness: As per the H and P dictated on admission Hospital Course:  Michael Ramsey a 64 y.o.malewith medical history significant of gout, HTN and CKD-IV, who presents with joint pain. Patient states that she started having joint pain, including bilateral ankle, left wrist and left elbow. Her pain is constant, moderate, sharp, nonradiating. All this joints also use  swelling, and mildly erythematous. Patient does not have fever or chills. No injury. Patient states that this is typical for his gout flare. Pt is followed at [facility] clinic. He is now on febuxostat. Currently, pt is unable to walk due to severe pain in both ankles.  ED Course:pt was found to have uric acid 13.8, pending COVID-19 PCR, WBC 19.0, worsening renal function, temperature normal, blood pressure 176/101, heart rate 100, RR 24, oxygen saturation 98% on room air. Patient is placed on MedSurg bed for observation.  The patient was admitted to a medical bed. He was given 50 mg of prednisone in the ED. He has been continued on 20 mg of prednisone daily. He states that he is unable to walk due to pain in his ankles.  Assessment and plan Diffuse joint pain (bilateral ankles, wrists, left ring finger), concerning for a gout flare improving.   Initial concern for infected DIP joint of left finger has completed 5 days of Zosyn, vancomycin.   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  Given has remained afebrile for greater than 48 hours and blood cultures unremarkable  Appreciate orthopedic recommendations, given location of left ring finger low likelihood of potential aspiration will monitor on antibiotics. Rheumatology consulted, appreciate recommendations Increase Uloric from 40 mg to 80 IV Solu-Medrol 40 mg x 1 followed by prednisone taper, to achieve goal uric acid less than 5 (to be followed as outpatient) PT recommends SNF, unable  to ambulate due to severity of pain  Leukocytosis with intermittent fevers, some concern for potential infection,  Improving.   Blood cultures unremarkable, most likely source potentially being left ring finger given soft tissue swelling noted on x-ray.   White count is downtrending he has remained afebrile for greater than 48 hours  CKD stage IV, stable.   Peak creatinine of 2.06 during hospital stay, back to baseline. Closely monitor BMP while  on colchicine  HTN, stable Continue Coreg, amlodipine  Thrombocytosis.  Suspect reactive in the setting of gout flare.   Unclear baseline.  Also on differential essential thrombocytosis that could cause elevated uric acid in the 90s for gout flares Monitor CBCs  No opiates for pain control prescribed.     Patient was seen by physical therapy, who recommended SNF, which was arranged. On the day of the discharge the patient's vitals were stable, and no other acute medical condition were reported by patient. the patient was felt safe to be discharge at SNF   Consultants: None Procedures: None  Discharge Exam: General: Appear in no distress, no Rash; Oral Mucosa Clear, moist. Cardiovascular: S1 and S2 Present, no Murmur, Respiratory: normal respiratory effort, Bilateral Air entry present and no Crackles, no wheezes Abdomen: Bowel Sound present, Soft and no tenderness, no hernia Extremities: no Pedal edema, no calf tenderness, left wrist and elbow tenderness and bilateral knee tenderness but no signs of erythema or infection. Neurology: alert and oriented to time, place, and person affect appropriate.  Filed Weights   05/11/19 0344  Weight: 79.4 kg   Vitals:   05/19/19 2349 05/20/19 0756  BP: (!) 145/86 (!) 167/85  Pulse: 65 65  Resp: 16 16  Temp: 97.6 F (36.4 C) (!) 97.4 F (36.3 C)  SpO2: 98% 99%    DISCHARGE MEDICATION: Allergies as of 05/20/2019   No Known Allergies     Medication List    TAKE these medications   acetaminophen 325 MG tablet Commonly known as: TYLENOL Take 2 tablets (650 mg total) by mouth every 6 (six) hours as needed for mild pain or fever.   amLODipine 10 MG tablet Commonly known as: NORVASC Take 1 tablet (10 mg total) by mouth daily. What changed:   medication strength  how much to take   carvedilol 12.5 MG tablet Commonly known as: COREG Take 12.5 mg by mouth 2 (two) times daily with a meal.   cetirizine 10 MG tablet Commonly  known as: ZYRTEC Take 10 mg by mouth daily.   colchicine 0.6 MG tablet Take 1 tablet (0.6 mg total) by mouth daily for 12 days.   febuxostat 40 MG tablet Commonly known as: ULORIC Take 2 tablets (80 mg total) by mouth daily. Start taking on: May 21, 2019 What changed:   how much to take  when to take this   predniSONE 10 MG tablet Commonly known as: DELTASONE Take 50mg  daily for 1days,Take 40mg  daily for 1days,Take 30mg  daily for 1days,Take 20mg  daily for 1days,Take 10mg  daily      No Known Allergies Discharge Instructions    Call MD for:  severe uncontrolled pain   Complete by: As directed    Call MD for:  temperature >100.4   Complete by: As directed    Diet - low sodium heart healthy   Complete by: As directed    Diet - low sodium heart healthy   Complete by: As directed    Discharge instructions   Complete by: As directed  Patient should be seen by PCP in few days, continue colchicine for 2 weeks and Tylenol as needed.  Continue bicarbonate for 1 week.  Monitor blood pressure and titrate medications accordingly, increase amlodipine from 5 to 10 mg.   Increase activity slowly   Complete by: As directed    Increase activity slowly   Complete by: As directed       The results of significant diagnostics from this hospitalization (including imaging, microbiology, ancillary and laboratory) are listed below for reference.    Significant Diagnostic Studies: DG Chest 2 View  Result Date: 05/15/2019 CLINICAL DATA:  Fever EXAM: CHEST - 2 VIEW COMPARISON:  Radiograph 07/11/2015 FINDINGS: Bandlike atelectasis seen in the region of the right middle lobe. Lungs are otherwise clear. No consolidation, features of edema, pneumothorax, or effusion. The cardiomediastinal contours are unremarkable. No acute osseous or soft tissue abnormality. Degenerative changes are present in the imaged spine and shoulders. Multilevel flowing anterior osteophytosis, compatible with features of  diffuse idiopathic skeletal hyperostosis (DISH). IMPRESSION: Bandlike atelectasis in the right middle lobe. No other acute cardiopulmonary abnormality. Electronically Signed   By: Lovena Le M.D.   On: 05/15/2019 19:54   DG Wrist 2 Views Left  Result Date: 05/15/2019 CLINICAL DATA:  Left wrist pain and swelling.  History of gout. EXAM: LEFT WRIST - 2 VIEW COMPARISON:  None. FINDINGS: No acute fracture or dislocation. Borderline widening of the scapholunate interval. No bony erosions or periostitis. Mild radiocarpal and first CMC joint space narrowing with small marginal osteophytes. Remaining joint spaces are preserved. Bone mineralization is normal. Mild soft tissue swelling about the wrist. Punctate radiopaque density in superficial radial aspect of the distal forearm. IMPRESSION: 1.  No acute osseous abnormality. 2. Borderline widening of the scapholunate interval may reflect underlying chronic scapholunate ligament injury. 3. 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 Electronically Signed   By: Titus Dubin M.D.   On: 05/15/2019 10:09   DG Knee 1-2 Views Left  Result Date: 05/19/2019 CLINICAL DATA:  Gout and renal failure.  Knee pain. EXAM: LEFT KNEE - 1-2 VIEW COMPARISON:  None. FINDINGS: Moderate-sized joint effusion. No joint space narrowing. Small medial compartment osteophytes. No focal bone finding. IMPRESSION: Moderate joint effusion. Mild medial compartment degenerative spurring. Electronically Signed   By: Nelson Chimes M.D.   On: 05/19/2019 14:15   DG Knee 1-2 Views Right  Result Date: 05/19/2019 CLINICAL DATA:  Gout and renal failure.  Knee pain. EXAM: RIGHT KNEE - 1-2 VIEW COMPARISON:  None. FINDINGS: Knee joint effusion. No evidence of joint space narrowing. Small medial compartment osteophytes. No other focal bone finding. IMPRESSION: Joint effusion.  Minimal spurring of the medial compartment. Electronically Signed   By: Nelson Chimes M.D.   On: 05/19/2019 14:15   DG  Ankle 2 Views Left  Result Date: 05/19/2019 CLINICAL DATA:  History of gout and renal failure.  Ankle pain. EXAM: LEFT ANKLE - 2 VIEW COMPARISON:  None FINDINGS: Ankle joint itself appears normal. 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 As seen on the other side, the anterior process of the talus is diminutive. Given the symmetric nature, this is probably congenital. No evidence of erosions to suggest gout. No acute fracture. IMPRESSION: Midfoot degenerative change and fusion. See above discussion. No sign of gout. Electronically Signed   By: Nelson Chimes M.D.   On: 05/19/2019 14:14   DG Ankle 2 Views Right  Result Date: 05/19/2019 CLINICAL DATA:  History of gout and renal failure.  Ankle  pain. EXAM: RIGHT ANKLE - 2 VIEW COMPARISON:  None. FINDINGS: Osteoarthritis of the ankle joint with some joint space narrowing and marginal osteophytes. Subtalar joint also shows some degenerative changes. Diminutive anterior process of the talus could be congenital or relate to old trauma. Regional soft tissue swelling. No sign of acute fracture. No evidence of erosions. IMPRESSION: Degenerative changes of the ankle joint and subtalar joint. Diminutive anterior process of the talus that could be congenital or relate to old trauma. No sign of erosive arthritis/gout by radiography. Electronically Signed   By: Nelson Chimes M.D.   On: 05/19/2019 14:13   DG Finger Ring Left  Result Date: 05/16/2019 CLINICAL DATA:  Erythema, swelling on left ring fingerPt reports his finger has been like that for "a while" but denies any injury or trauma to the finger. Pt unable to move finger very well. Redness and pus noted distally EXAM: LEFT RING FINGER 2+V COMPARISON:  None. FINDINGS: No fracture or bone lesion. There is no bone resorption to suggest osteomyelitis. Bony prominence is noted from the ulnar aspect of the head of the middle phalanx of the fourth finger. There is significant soft tissue swelling, most evident along the dorsal  ulnar aspect and volar aspect of the mid finger. No radiopaque foreign body. No soft tissue air. IMPRESSION: 1. No fracture or dislocation.  No evidence of osteomyelitis. 2. Soft tissue swelling without soft tissue air or radiopaque foreign body. Electronically Signed   By: Lajean Manes M.D.   On: 05/16/2019 10:48    Microbiology: Recent Results (from the past 240 hour(s))  SARS Coronavirus 2 by RT PCR (hospital order, performed in [facility] hospital lab) Nasopharyngeal Nasopharyngeal Swab     Status: None   Collection Time: 05/11/19 11:31 AM   Specimen: Nasopharyngeal Swab  Result Value Ref Range Status   SARS Coronavirus 2 NEGATIVE NEGATIVE Final    Comment: (NOTE) SARS-CoV-2 target nucleic acids are NOT DETECTED. The SARS-CoV-2 RNA is generally detectable in upper and lower respiratory specimens during the acute phase of infection. The lowest concentration of SARS-CoV-2 viral copies this assay can detect is 250 copies / mL. A negative result does not preclude SARS-CoV-2 infection and should not be used as the sole basis for treatment or other patient management decisions.  A negative result may occur with improper specimen collection / handling, submission of specimen other than nasopharyngeal swab, presence of viral mutation(s) within the areas targeted by this assay, and inadequate number of viral copies (<250 copies / mL). A negative result must be combined with clinical observations, patient history, and epidemiological information. Fact Sheet for Patients:   StrictlyIdeas.no Fact Sheet for Healthcare Providers: BankingDealers.co.za This test is not yet approved or cleared  by the [place] FDA and has been authorized for detection and/or diagnosis of SARS-CoV-2 by FDA under an Emergency Use Authorization (EUA).  This EUA will remain in effect (meaning this test can be used) for the duration of the COVID-19 declaration under  Section 564(b)(1) of the Act, 21 U.S.C. section 360bbb-3(b)(1), unless the authorization is terminated or revoked sooner. Performed at [facility], [address] [place], [place] [address]   CULTURE, BLOOD (ROUTINE X 2) w Reflex to ID Panel     Status: None   Collection Time: 05/11/19  5:03 PM   Specimen: BLOOD  Result Value Ref Range Status   Specimen Description BLOOD LEFT ANTECUBITAL  Final   Special Requests   Final    BOTTLES DRAWN AEROBIC  AND ANAEROBIC Blood Culture adequate volume   Culture   Final    NO GROWTH 5 DAYS Performed at [facility], [address] [place], [place] [address]    Report Status 05/16/2019 FINAL  Final  CULTURE, BLOOD (ROUTINE X 2) w Reflex to ID Panel     Status: None   Collection Time: 05/11/19  6:30 PM   Specimen: BLOOD  Result Value Ref Range Status   Specimen Description BLOOD RIGHT ANTECUBITAL  Final   Special Requests   Final    BOTTLES DRAWN AEROBIC AND ANAEROBIC Blood Culture adequate volume   Culture   Final    NO GROWTH 5 DAYS Performed at [facility], [address], [place], [place] [address]    Report Status 05/16/2019 FINAL  Final  SARS CORONAVIRUS 2 (TAT 6-24 HRS) Nasopharyngeal Nasopharyngeal Swab     Status: None   Collection Time: 05/14/19 11:14 AM   Specimen: Nasopharyngeal Swab  Result Value Ref Range Status   SARS Coronavirus 2 NEGATIVE NEGATIVE Final    Comment: (NOTE) SARS-CoV-2 target nucleic acids are NOT DETECTED. The SARS-CoV-2 RNA is generally detectable in upper and lower respiratory specimens during the acute phase of infection. Negative results do not preclude SARS-CoV-2 infection, do not rule out co-infections with other pathogens, and should not be used as the sole basis for treatment or other patient management decisions. Negative results must be combined with clinical observations, patient history, and epidemiological information. The expected result is Negative. Fact  Sheet for Patients: SugarRoll.be Fact Sheet for Healthcare Providers: https://www.woods-mathews.com/ This test is not yet approved or cleared by the [place] FDA and  has been authorized for detection and/or diagnosis of SARS-CoV-2 by FDA under an Emergency Use Authorization (EUA). This EUA will remain  in effect (meaning this test can be used) for the duration of the COVID-19 declaration under Section 56 4(b)(1) of the Act, 21 U.S.C. section 360bbb-3(b)(1), unless the authorization is terminated or revoked sooner. Performed at [facility], [place] [address] [place], [place] [address]   SARS Coronavirus 2 by RT PCR (hospital order, performed in [facility] hospital lab) Nasopharyngeal Nasopharyngeal Swab     Status: None   Collection Time: 05/14/19 12:40 PM   Specimen: Nasopharyngeal Swab  Result Value Ref Range Status   SARS Coronavirus 2 NEGATIVE NEGATIVE Final    Comment: (NOTE) SARS-CoV-2 target nucleic acids are NOT DETECTED. The SARS-CoV-2 RNA is generally detectable in upper and lower respiratory specimens during the acute phase of infection. The lowest concentration of SARS-CoV-2 viral copies this assay can detect is 250 copies / mL. A negative result does not preclude SARS-CoV-2 infection and should not be used as the sole basis for treatment or other patient management decisions.  A negative result may occur with improper specimen collection / handling, submission of specimen other than nasopharyngeal swab, presence of viral mutation(s) within the areas targeted by this assay, and inadequate number of viral copies (<250 copies / mL). A negative result must be combined with clinical observations, patient history, and epidemiological information. Fact Sheet for Patients:   StrictlyIdeas.no Fact Sheet for Healthcare Providers: BankingDealers.co.za This test is not yet approved or  cleared  by the [place] FDA and has been authorized for detection and/or diagnosis of SARS-CoV-2 by FDA under an Emergency Use Authorization (EUA).  This EUA will remain in effect (meaning this test can be used) for the duration of the COVID-19 declaration under Section 564(b)(1) of the  Act, 21 U.S.C. section 360bbb-3(b)(1), unless the authorization is terminated or revoked sooner. Performed at [facility], [address] [place], [place] [address]   CULTURE, BLOOD (ROUTINE X 2) w Reflex to ID Panel     Status: None   Collection Time: 05/15/19  5:13 PM   Specimen: BLOOD  Result Value Ref Range Status   Specimen Description BLOOD RIGHT ANTECUBITAL  Final   Special Requests   Final    BOTTLES DRAWN AEROBIC AND ANAEROBIC Blood Culture results may not be optimal due to an excessive volume of blood received in culture bottles   Culture   Final    NO GROWTH 5 DAYS Performed at [facility], [address] [place], [place] [address]    Report Status 05/20/2019 FINAL  Final  CULTURE, BLOOD (ROUTINE X 2) w Reflex to ID Panel     Status: None   Collection Time: 05/15/19  5:19 PM   Specimen: BLOOD  Result Value Ref Range Status   Specimen Description BLOOD BLOOD RIGHT HAND  Final   Special Requests   Final    BOTTLES DRAWN AEROBIC AND ANAEROBIC Blood Culture adequate volume   Culture   Final    NO GROWTH 5 DAYS Performed at [facility], [address], [place], [place] [address]    Report Status 05/20/2019 FINAL  Final  Anaerobic culture     Status: None (Preliminary result)   Collection Time: 05/15/19  7:15 PM   Specimen: Joint, Finger  Result Value Ref Range Status   Specimen Description   Final    FINGER Performed at [facility], [address] [place], [place] [address]    Special Requests   Final    Normal Performed at [facility], [address], [place], [place] [address]    Culture   Final    NO ANAEROBES  ISOLATED; CULTURE IN PROGRESS FOR 5 DAYS   Report Status PENDING  Incomplete  SARS CORONAVIRUS 2 (TAT 6-24 HRS) Nasopharyngeal Nasopharyngeal Swab     Status: None   Collection Time: 05/18/19  3:32 PM   Specimen: Nasopharyngeal Swab  Result Value Ref Range Status   SARS Coronavirus 2 NEGATIVE NEGATIVE Final    Comment: (NOTE) SARS-CoV-2 target nucleic acids are NOT DETECTED. The SARS-CoV-2 RNA is generally detectable in upper and lower respiratory specimens during the acute phase of infection. Negative results do not preclude SARS-CoV-2 infection, do not rule out co-infections with other pathogens, and should not be used as the sole basis for treatment or other patient management decisions. Negative results must be combined with clinical observations, patient history, and epidemiological information. The expected result is Negative. Fact Sheet for Patients: SugarRoll.be Fact Sheet for Healthcare Providers: https://www.woods-mathews.com/ This test is not yet approved or cleared by the [place] FDA and  has been authorized for detection and/or diagnosis of SARS-CoV-2 by FDA under an Emergency Use Authorization (EUA). This EUA will remain  in effect (meaning this test can be used) for the duration of the COVID-19 declaration under Section 56 4(b)(1) of the Act, 21 U.S.C. section 360bbb-3(b)(1), unless the authorization is terminated or revoked sooner. Performed at [facility], [place] [address] [place],  [address]      Labs: CBC: Recent Labs  Lab 05/16/19 0505 05/17/19 0700 05/18/19 0425 05/19/19 0435 05/20/19 0501  WBC 16.4* 13.8* 13.3* 13.8* 17.0*  NEUTROABS  --   --   --   --  14.6*  HGB 11.0* 10.5* 10.4* 10.4* 11.0*  HCT 35.2* 32.6* 33.1* 33.7* 34.8*  MCV 88.9 85.8 87.8 89.6 87.4  PLT 510* 602* 623* 735* 520*   Basic Metabolic Panel: Recent Labs  Lab 05/15/19 0514 05/15/19 0514 05/16/19 0505 05/17/19 0700  05/18/19 0425 05/19/19 0435 05/20/19 0501  NA 138  --  137 136 138  --  137  K 4.2  --  4.4 4.1 3.9  --  4.3  CL 108  --  107 106 107  --  105  CO2 22  --  22 21* 21*  --  22  GLUCOSE 103*  --  96 101* 97  --  131*  BUN 33*  --  29* 27* 28*  --  37*  CREATININE 1.52*   < > 1.41* 1.47* 1.56* 1.48* 1.27*  CALCIUM 8.2*  --  8.3* 8.3* 8.5*  --  8.8*  MG  --   --   --   --   --   --  2.3   < > = values in this interval not displayed.   Liver Function Tests: No results for input(s): AST, ALT, ALKPHOS, BILITOT, PROT, ALBUMIN in the last 168 hours. No results for input(s): LIPASE, AMYLASE in the last 168 hours. No results for input(s): AMMONIA in the last 168 hours. Cardiac Enzymes: No results for input(s): CKTOTAL, CKMB, CKMBINDEX, TROPONINI in the last 168 hours. BNP (last 3 results) No results for input(s): BNP in the last 8760 hours. CBG: No results for input(s): GLUCAP in the last 168 hours.  Time spent: 35 minutes  Signed:  Berle Mull  [facility]  05/20/2019 12:38 PM

## 2019-05-20 NOTE — Plan of Care (Signed)
  Problem: Education: Goal: Knowledge of General Education information will improve Description: Including pain rating scale, medication(s)/side effects and non-pharmacologic comfort measures Outcome: Progressing   Problem: Health Behavior/Discharge Planning: Goal: Ability to manage health-related needs will improve Outcome: Progressing   Problem: Clinical Measurements: Goal: Respiratory complications will improve Outcome: Progressing Goal: Cardiovascular complication will be avoided Outcome: Progressing   Problem: Activity: Goal: Risk for activity intolerance will decrease Outcome: Progressing   Problem: Coping: Goal: Level of anxiety will decrease Outcome: Progressing

## 2019-05-20 NOTE — TOC Transition Note (Signed)
Transition of Care [facility]) - CM/SW Discharge Note   Patient Details  Name: Michael Ramsey MRN: [id number] Date of Birth: 01-22-1955  [facility] CM/SW Contact:  Su Hilt, RN Phone Number: 05/20/2019, 1:31 PM   Clinical Narrative:     Bedside Nurse called report to facility, RNCM called EMS for transport, Patient to notify family of DC  Final next level of care: Skilled Nursing Facility Barriers to Discharge: Barriers Resolved   Patient Goals and CMS Choice        Discharge Placement              Patient chooses bed at: [facility]  Patient to be transferred to facility by: EMS Name of family member notified: Velva Harman Patient and family notified of of transfer: 05/14/19  Discharge Plan and Services                                     Social Determinants of Health (SDOH) Interventions     Readmission Risk Interventions No flowsheet data found.

## 2019-05-21 LAB — ANAEROBIC CULTURE: Special Requests: NORMAL

## 2019-09-07 ENCOUNTER — Other Ambulatory Visit: Payer: Self-pay

## 2019-09-07 DIAGNOSIS — N170 Acute kidney failure with tubular necrosis: Secondary | ICD-10-CM | POA: Diagnosis present

## 2019-09-07 DIAGNOSIS — E872 Acidosis: Secondary | ICD-10-CM | POA: Diagnosis present

## 2019-09-07 DIAGNOSIS — Z66 Do not resuscitate: Secondary | ICD-10-CM | POA: Diagnosis not present

## 2019-09-07 DIAGNOSIS — N390 Urinary tract infection, site not specified: Secondary | ICD-10-CM | POA: Diagnosis present

## 2019-09-07 DIAGNOSIS — R569 Unspecified convulsions: Secondary | ICD-10-CM | POA: Diagnosis present

## 2019-09-07 DIAGNOSIS — R739 Hyperglycemia, unspecified: Secondary | ICD-10-CM | POA: Diagnosis present

## 2019-09-07 DIAGNOSIS — I2109 ST elevation (STEMI) myocardial infarction involving other coronary artery of anterior wall: Principal | ICD-10-CM | POA: Diagnosis present

## 2019-09-07 DIAGNOSIS — I469 Cardiac arrest, cause unspecified: Secondary | ICD-10-CM | POA: Diagnosis present

## 2019-09-07 DIAGNOSIS — I255 Ischemic cardiomyopathy: Secondary | ICD-10-CM | POA: Diagnosis present

## 2019-09-07 DIAGNOSIS — G92 Toxic encephalopathy: Secondary | ICD-10-CM | POA: Diagnosis present

## 2019-09-07 DIAGNOSIS — J9602 Acute respiratory failure with hypercapnia: Secondary | ICD-10-CM | POA: Diagnosis present

## 2019-09-07 DIAGNOSIS — Z79899 Other long term (current) drug therapy: Secondary | ICD-10-CM

## 2019-09-07 DIAGNOSIS — M109 Gout, unspecified: Secondary | ICD-10-CM | POA: Diagnosis present

## 2019-09-07 DIAGNOSIS — I959 Hypotension, unspecified: Secondary | ICD-10-CM | POA: Diagnosis present

## 2019-09-07 DIAGNOSIS — N189 Chronic kidney disease, unspecified: Secondary | ICD-10-CM | POA: Diagnosis present

## 2019-09-07 DIAGNOSIS — G253 Myoclonus: Secondary | ICD-10-CM | POA: Diagnosis present

## 2019-09-07 DIAGNOSIS — R570 Cardiogenic shock: Secondary | ICD-10-CM | POA: Diagnosis present

## 2019-09-07 DIAGNOSIS — I213 ST elevation (STEMI) myocardial infarction of unspecified site: Secondary | ICD-10-CM

## 2019-09-07 DIAGNOSIS — Z20822 Contact with and (suspected) exposure to covid-19: Secondary | ICD-10-CM | POA: Diagnosis present

## 2019-09-07 DIAGNOSIS — Z515 Encounter for palliative care: Secondary | ICD-10-CM | POA: Diagnosis not present

## 2019-09-07 DIAGNOSIS — I5021 Acute systolic (congestive) heart failure: Secondary | ICD-10-CM | POA: Diagnosis present

## 2019-09-07 DIAGNOSIS — J9601 Acute respiratory failure with hypoxia: Secondary | ICD-10-CM | POA: Diagnosis present

## 2019-09-07 DIAGNOSIS — D631 Anemia in chronic kidney disease: Secondary | ICD-10-CM | POA: Diagnosis present

## 2019-09-07 DIAGNOSIS — G931 Anoxic brain damage, not elsewhere classified: Secondary | ICD-10-CM | POA: Diagnosis present

## 2019-09-07 DIAGNOSIS — I130 Hypertensive heart and chronic kidney disease with heart failure and stage 1 through stage 4 chronic kidney disease, or unspecified chronic kidney disease: Secondary | ICD-10-CM | POA: Diagnosis present

## 2019-09-07 LAB — GLUCOSE, CAPILLARY: Glucose-Capillary: 259 mg/dL — ABNORMAL HIGH (ref 70–99)

## 2019-09-07 NOTE — ED Triage Notes (Signed)
Pt arrives [facility] from home w cc of resp distress. Pt called EMS, went silent on phone, unknown downtime. EMS arrives, pt was unresponsive on couch, not breathing but had pulse. Pt lost pulse anmd was given 1 round epi and 5 min cPR. Pulse came back in 90s w ST elevation.  Pt has 64mm ET tube, IO l tibia, 20G R ac  CBG here is 259

## 2019-09-07 NOTE — ED Triage Notes (Signed)
Pt receiving fluids and dopamine currently

## 2019-09-08 ENCOUNTER — Emergency Department: Payer: Medicare Other

## 2019-09-08 ENCOUNTER — Inpatient Hospital Stay (HOSPITAL_COMMUNITY)
Admit: 2019-09-08 | Discharge: 2019-09-08 | Disposition: A | Discharge status: Home / Self Care | Payer: Medicare Other | Attending: Pulmonary Disease | Admitting: Pulmonary Disease

## 2019-09-08 DIAGNOSIS — I959 Hypotension, unspecified: Secondary | ICD-10-CM | POA: Diagnosis present

## 2019-09-08 DIAGNOSIS — Z515 Encounter for palliative care: Secondary | ICD-10-CM | POA: Diagnosis not present

## 2019-09-08 DIAGNOSIS — N189 Chronic kidney disease, unspecified: Secondary | ICD-10-CM | POA: Diagnosis present

## 2019-09-08 DIAGNOSIS — E872 Acidosis: Secondary | ICD-10-CM | POA: Diagnosis present

## 2019-09-08 DIAGNOSIS — I340 Nonrheumatic mitral (valve) insufficiency: Secondary | ICD-10-CM | POA: Diagnosis not present

## 2019-09-08 DIAGNOSIS — J9602 Acute respiratory failure with hypercapnia: Secondary | ICD-10-CM | POA: Diagnosis present

## 2019-09-08 DIAGNOSIS — I2109 ST elevation (STEMI) myocardial infarction involving other coronary artery of anterior wall: Secondary | ICD-10-CM | POA: Diagnosis present

## 2019-09-08 DIAGNOSIS — G92 Toxic encephalopathy: Secondary | ICD-10-CM | POA: Diagnosis present

## 2019-09-08 DIAGNOSIS — I213 ST elevation (STEMI) myocardial infarction of unspecified site: Secondary | ICD-10-CM

## 2019-09-08 DIAGNOSIS — I469 Cardiac arrest, cause unspecified: Secondary | ICD-10-CM | POA: Diagnosis present

## 2019-09-08 DIAGNOSIS — I130 Hypertensive heart and chronic kidney disease with heart failure and stage 1 through stage 4 chronic kidney disease, or unspecified chronic kidney disease: Secondary | ICD-10-CM | POA: Diagnosis present

## 2019-09-08 DIAGNOSIS — G931 Anoxic brain damage, not elsewhere classified: Secondary | ICD-10-CM | POA: Diagnosis present

## 2019-09-08 DIAGNOSIS — I255 Ischemic cardiomyopathy: Secondary | ICD-10-CM | POA: Diagnosis present

## 2019-09-08 DIAGNOSIS — G939 Disorder of brain, unspecified: Secondary | ICD-10-CM | POA: Diagnosis not present

## 2019-09-08 DIAGNOSIS — J9601 Acute respiratory failure with hypoxia: Secondary | ICD-10-CM | POA: Diagnosis present

## 2019-09-08 DIAGNOSIS — N179 Acute kidney failure, unspecified: Secondary | ICD-10-CM | POA: Diagnosis not present

## 2019-09-08 DIAGNOSIS — D631 Anemia in chronic kidney disease: Secondary | ICD-10-CM | POA: Diagnosis present

## 2019-09-08 DIAGNOSIS — N390 Urinary tract infection, site not specified: Secondary | ICD-10-CM | POA: Diagnosis present

## 2019-09-08 DIAGNOSIS — N170 Acute kidney failure with tubular necrosis: Secondary | ICD-10-CM | POA: Diagnosis present

## 2019-09-08 DIAGNOSIS — R739 Hyperglycemia, unspecified: Secondary | ICD-10-CM | POA: Diagnosis present

## 2019-09-08 DIAGNOSIS — R579 Shock, unspecified: Secondary | ICD-10-CM | POA: Diagnosis not present

## 2019-09-08 DIAGNOSIS — R569 Unspecified convulsions: Secondary | ICD-10-CM

## 2019-09-08 DIAGNOSIS — M109 Gout, unspecified: Secondary | ICD-10-CM | POA: Diagnosis present

## 2019-09-08 DIAGNOSIS — Z20822 Contact with and (suspected) exposure to covid-19: Secondary | ICD-10-CM | POA: Diagnosis present

## 2019-09-08 DIAGNOSIS — I5021 Acute systolic (congestive) heart failure: Secondary | ICD-10-CM | POA: Diagnosis present

## 2019-09-08 DIAGNOSIS — R570 Cardiogenic shock: Secondary | ICD-10-CM | POA: Diagnosis present

## 2019-09-08 DIAGNOSIS — Z79899 Other long term (current) drug therapy: Secondary | ICD-10-CM | POA: Diagnosis not present

## 2019-09-08 DIAGNOSIS — G253 Myoclonus: Secondary | ICD-10-CM | POA: Diagnosis present

## 2019-09-08 DIAGNOSIS — Z66 Do not resuscitate: Secondary | ICD-10-CM | POA: Diagnosis not present

## 2019-09-08 LAB — BLOOD GAS, ARTERIAL
Acid-base deficit: 11.1 mmol/L — ABNORMAL HIGH (ref 0.0–2.0)
Acid-base deficit: 6.9 mmol/L — ABNORMAL HIGH (ref 0.0–2.0)
Bicarbonate: 18 mmol/L — ABNORMAL LOW (ref 20.0–28.0)
Bicarbonate: 20.2 mmol/L (ref 20.0–28.0)
FIO2: 1
FIO2: 1
MECHVT: 450 mL
MECHVT: 450 mL
O2 Saturation: 100 %
O2 Saturation: 99.9 %
PEEP: 5 cmH2O
PEEP: 5 cmH2O
Patient temperature: 37
Patient temperature: 37
RATE: 20 resp/min
RATE: 20 resp/min
pCO2 arterial: 46 mmHg (ref 32.0–48.0)
pCO2 arterial: 54 mmHg — ABNORMAL HIGH (ref 32.0–48.0)
pH, Arterial: 7.13 — CL (ref 7.350–7.450)
pH, Arterial: 7.25 — ABNORMAL LOW (ref 7.350–7.450)
pO2, Arterial: 384 mmHg — ABNORMAL HIGH (ref 83.0–108.0)
pO2, Arterial: 431 mmHg — ABNORMAL HIGH (ref 83.0–108.0)

## 2019-09-08 LAB — CBC WITH DIFFERENTIAL/PLATELET
Abs Immature Granulocytes: 0.12 10*3/uL — ABNORMAL HIGH (ref 0.00–0.07)
Abs Immature Granulocytes: 0.39 10*3/uL — ABNORMAL HIGH (ref 0.00–0.07)
Basophils Absolute: 0 10*3/uL (ref 0.0–0.1)
Basophils Absolute: 0 10*3/uL (ref 0.0–0.1)
Basophils Relative: 0 %
Basophils Relative: 0 %
Eosinophils Absolute: 0 10*3/uL (ref 0.0–0.5)
Eosinophils Absolute: 0.1 10*3/uL (ref 0.0–0.5)
Eosinophils Relative: 0 %
Eosinophils Relative: 1 %
HCT: 35.2 % — ABNORMAL LOW (ref 39.0–52.0)
HCT: 36 % — ABNORMAL LOW (ref 39.0–52.0)
Hemoglobin: 11 g/dL — ABNORMAL LOW (ref 13.0–17.0)
Hemoglobin: 11.2 g/dL — ABNORMAL LOW (ref 13.0–17.0)
Immature Granulocytes: 1 %
Immature Granulocytes: 3 %
Lymphocytes Relative: 28 %
Lymphocytes Relative: 5 %
Lymphs Abs: 0.6 10*3/uL — ABNORMAL LOW (ref 0.7–4.0)
Lymphs Abs: 4 10*3/uL (ref 0.7–4.0)
MCH: 29.5 pg (ref 26.0–34.0)
MCH: 29.9 pg (ref 26.0–34.0)
MCHC: 31.1 g/dL (ref 30.0–36.0)
MCHC: 31.3 g/dL (ref 30.0–36.0)
MCV: 94.4 fL (ref 80.0–100.0)
MCV: 96 fL (ref 80.0–100.0)
Monocytes Absolute: 0.8 10*3/uL (ref 0.1–1.0)
Monocytes Absolute: 0.9 10*3/uL (ref 0.1–1.0)
Monocytes Relative: 5 %
Monocytes Relative: 7 %
Neutro Abs: 11.8 10*3/uL — ABNORMAL HIGH (ref 1.7–7.7)
Neutro Abs: 8.8 10*3/uL — ABNORMAL HIGH (ref 1.7–7.7)
Neutrophils Relative %: 63 %
Neutrophils Relative %: 87 %
Platelets: 364 10*3/uL (ref 150–400)
Platelets: 384 10*3/uL (ref 150–400)
RBC: 3.73 MIL/uL — ABNORMAL LOW (ref 4.22–5.81)
RBC: 3.75 MIL/uL — ABNORMAL LOW (ref 4.22–5.81)
RDW: 14.4 % (ref 11.5–15.5)
RDW: 14.5 % (ref 11.5–15.5)
WBC: 13.5 10*3/uL — ABNORMAL HIGH (ref 4.0–10.5)
WBC: 14.1 10*3/uL — ABNORMAL HIGH (ref 4.0–10.5)
nRBC: 0 % (ref 0.0–0.2)
nRBC: 0 % (ref 0.0–0.2)

## 2019-09-08 LAB — PROTIME-INR
INR: 1.2 (ref 0.8–1.2)
INR: 1.2 (ref 0.8–1.2)
Prothrombin Time: 14.4 seconds (ref 11.4–15.2)
Prothrombin Time: 14.4 seconds (ref 11.4–15.2)

## 2019-09-08 LAB — LACTIC ACID, PLASMA
Lactic Acid, Venous: 1.4 mmol/L (ref 0.5–1.9)
Lactic Acid, Venous: 1.8 mmol/L (ref 0.5–1.9)
Lactic Acid, Venous: 4.3 mmol/L (ref 0.5–1.9)

## 2019-09-08 LAB — URINE DRUG SCREEN, QUALITATIVE (ARMC ONLY)
Amphetamines, Ur Screen: NOT DETECTED
Barbiturates, Ur Screen: NOT DETECTED
Benzodiazepine, Ur Scrn: NOT DETECTED
Cannabinoid 50 Ng, Ur ~~LOC~~: NOT DETECTED
Cocaine Metabolite,Ur ~~LOC~~: NOT DETECTED
MDMA (Ecstasy)Ur Screen: NOT DETECTED
Methadone Scn, Ur: NOT DETECTED
Opiate, Ur Screen: NOT DETECTED
Phencyclidine (PCP) Ur S: NOT DETECTED
Tricyclic, Ur Screen: NOT DETECTED

## 2019-09-08 LAB — URINALYSIS, COMPLETE (UACMP) WITH MICROSCOPIC
Bilirubin Urine: NEGATIVE
Glucose, UA: 50 mg/dL — AB
Hgb urine dipstick: NEGATIVE
Ketones, ur: NEGATIVE mg/dL
Leukocytes,Ua: NEGATIVE
Nitrite: NEGATIVE
Protein, ur: >=300 mg/dL — AB
Specific Gravity, Urine: 1.018 (ref 1.005–1.030)
WBC, UA: >50 WBC/hpf — ABNORMAL HIGH (ref 0–5)
pH: 5 (ref 5.0–8.0)

## 2019-09-08 LAB — RESPIRATORY PANEL BY PCR

## 2019-09-08 LAB — COMPREHENSIVE METABOLIC PANEL
ALT: 35 U/L (ref 0–44)
ALT: 35 U/L (ref 0–44)
AST: 40 U/L (ref 15–41)
AST: 44 U/L — ABNORMAL HIGH (ref 15–41)
Albumin: 3.2 g/dL — ABNORMAL LOW (ref 3.5–5.0)
Albumin: 3.4 g/dL — ABNORMAL LOW (ref 3.5–5.0)
Alkaline Phosphatase: 100 U/L (ref 38–126)
Alkaline Phosphatase: 87 U/L (ref 38–126)
Anion gap: 14 (ref 5–15)
Anion gap: 14 (ref 5–15)
BUN: 42 mg/dL — ABNORMAL HIGH (ref 8–23)
BUN: 44 mg/dL — ABNORMAL HIGH (ref 8–23)
CO2: 18 mmol/L — ABNORMAL LOW (ref 22–32)
CO2: 21 mmol/L — ABNORMAL LOW (ref 22–32)
Calcium: 8.2 mg/dL — ABNORMAL LOW (ref 8.9–10.3)
Calcium: 8.2 mg/dL — ABNORMAL LOW (ref 8.9–10.3)
Chloride: 105 mmol/L (ref 98–111)
Chloride: 107 mmol/L (ref 98–111)
Creatinine, Ser: 1.99 mg/dL — ABNORMAL HIGH (ref 0.61–1.24)
Creatinine, Ser: 2.32 mg/dL — ABNORMAL HIGH (ref 0.61–1.24)
GFR calc Af Amer: 33 mL/min — ABNORMAL LOW (ref >60)
GFR calc Af Amer: 40 mL/min — ABNORMAL LOW (ref >60)
GFR calc non Af Amer: 28 mL/min — ABNORMAL LOW (ref >60)
GFR calc non Af Amer: 34 mL/min — ABNORMAL LOW (ref >60)
Glucose, Bld: 215 mg/dL — ABNORMAL HIGH (ref 70–99)
Glucose, Bld: 278 mg/dL — ABNORMAL HIGH (ref 70–99)
Potassium: 3.9 mmol/L (ref 3.5–5.1)
Potassium: 4.4 mmol/L (ref 3.5–5.1)
Sodium: 139 mmol/L (ref 135–145)
Sodium: 140 mmol/L (ref 135–145)
Total Bilirubin: 0.5 mg/dL (ref 0.3–1.2)
Total Bilirubin: 0.8 mg/dL (ref 0.3–1.2)
Total Protein: 6.9 g/dL (ref 6.5–8.1)
Total Protein: 7.1 g/dL (ref 6.5–8.1)

## 2019-09-08 LAB — ECHOCARDIOGRAM COMPLETE
AR max vel: 1.26 cm2
AV Area VTI: 1.49 cm2
AV Area mean vel: 1.23 cm2
AV Mean grad: 6 mmHg
AV Peak grad: 11.3 mmHg
Ao pk vel: 1.68 m/s
Area-P 1/2: 3.34 cm2
Height: 68 in
S' Lateral: 2.94 cm
Weight: 2557.34 oz

## 2019-09-08 LAB — GLUCOSE, CAPILLARY
Glucose-Capillary: 104 mg/dL — ABNORMAL HIGH (ref 70–99)
Glucose-Capillary: 106 mg/dL — ABNORMAL HIGH (ref 70–99)
Glucose-Capillary: 117 mg/dL — ABNORMAL HIGH (ref 70–99)
Glucose-Capillary: 121 mg/dL — ABNORMAL HIGH (ref 70–99)
Glucose-Capillary: 217 mg/dL — ABNORMAL HIGH (ref 70–99)
Glucose-Capillary: 89 mg/dL (ref 70–99)

## 2019-09-08 LAB — BASIC METABOLIC PANEL
Anion gap: 13 (ref 5–15)
BUN: 40 mg/dL — ABNORMAL HIGH (ref 8–23)
CO2: 16 mmol/L — ABNORMAL LOW (ref 22–32)
Calcium: 7.7 mg/dL — ABNORMAL LOW (ref 8.9–10.3)
Chloride: 109 mmol/L (ref 98–111)
Creatinine, Ser: 1.92 mg/dL — ABNORMAL HIGH (ref 0.61–1.24)
GFR calc Af Amer: 41 mL/min — ABNORMAL LOW (ref >60)
GFR calc non Af Amer: 36 mL/min — ABNORMAL LOW (ref >60)
Glucose, Bld: 130 mg/dL — ABNORMAL HIGH (ref 70–99)
Potassium: 3.8 mmol/L (ref 3.5–5.1)
Sodium: 138 mmol/L (ref 135–145)

## 2019-09-08 LAB — TROPONIN I (HIGH SENSITIVITY)
Troponin I (High Sensitivity): 11411 ng/L (ref <18)
Troponin I (High Sensitivity): 12764 ng/L (ref <18)
Troponin I (High Sensitivity): 9141 ng/L

## 2019-09-08 LAB — HEMOGLOBIN A1C
Hgb A1c MFr Bld: 5.2 % (ref 4.8–5.6)
Mean Plasma Glucose: 102.54 mg/dL

## 2019-09-08 LAB — STREP PNEUMONIAE URINARY ANTIGEN: Strep Pneumo Urinary Antigen: NEGATIVE

## 2019-09-08 LAB — PROCALCITONIN: Procalcitonin: 3.2 ng/mL

## 2019-09-08 LAB — SARS CORONAVIRUS 2 BY RT PCR (HOSPITAL ORDER, PERFORMED IN ~~LOC~~ HOSPITAL LAB): SARS Coronavirus 2: NEGATIVE

## 2019-09-08 LAB — BRAIN NATRIURETIC PEPTIDE: B Natriuretic Peptide: 1073.8 pg/mL — ABNORMAL HIGH (ref 0.0–100.0)

## 2019-09-08 LAB — MRSA PCR SCREENING: MRSA by PCR: NEGATIVE

## 2019-09-08 LAB — APTT
aPTT: 33 seconds (ref 24–36)
aPTT: 83 seconds — ABNORMAL HIGH (ref 24–36)

## 2019-09-08 LAB — HEPARIN LEVEL (UNFRACTIONATED)
Heparin Unfractionated: 0.27 IU/mL — ABNORMAL LOW (ref 0.30–0.70)
Heparin Unfractionated: 0.51 IU/mL (ref 0.30–0.70)

## 2019-09-08 MED ORDER — VANCOMYCIN HCL 2000 MG/400ML IV SOLN
2000.0000 mg | Freq: Once | INTRAVENOUS | Status: AC
  Administered 2019-09-08: 2000 mg via INTRAVENOUS
  Filled 2019-09-08: qty 400

## 2019-09-08 MED ORDER — ASPIRIN 300 MG RE SUPP
300.0000 mg | RECTAL | Status: AC
  Administered 2019-09-08: 300 mg via RECTAL
  Filled 2019-09-08: qty 1

## 2019-09-08 MED ORDER — HEPARIN BOLUS VIA INFUSION
4000.0000 [IU] | Freq: Once | INTRAVENOUS | Status: AC
  Administered 2019-09-08: 4000 [IU] via INTRAVENOUS
  Filled 2019-09-08: qty 4000

## 2019-09-08 MED ORDER — SODIUM CHLORIDE 0.9 % IV SOLN
2.0000 g | Freq: Two times a day (BID) | INTRAVENOUS | Status: DC
  Administered 2019-09-08 (×2): 2 g via INTRAVENOUS
  Filled 2019-09-08 (×4): qty 2

## 2019-09-08 MED ORDER — FAMOTIDINE IN NACL 20-0.9 MG/50ML-% IV SOLN
20.0000 mg | Freq: Every day | INTRAVENOUS | Status: DC
  Administered 2019-09-08 – 2019-09-09 (×2): 20 mg via INTRAVENOUS
  Filled 2019-09-08 (×2): qty 50

## 2019-09-08 MED ORDER — FENTANYL BOLUS VIA INFUSION
25.0000 ug | INTRAVENOUS | Status: DC | PRN
  Filled 2019-09-08: qty 25

## 2019-09-08 MED ORDER — SODIUM CHLORIDE 0.9 % IV SOLN
3.0000 g | Freq: Four times a day (QID) | INTRAVENOUS | Status: DC
  Administered 2019-09-08 – 2019-09-09 (×3): 3 g via INTRAVENOUS
  Filled 2019-09-08: qty 8
  Filled 2019-09-08: qty 1
  Filled 2019-09-08: qty 3
  Filled 2019-09-08: qty 1
  Filled 2019-09-08: qty 8

## 2019-09-08 MED ORDER — LEVETIRACETAM IN NACL 1500 MG/100ML IV SOLN: 1500.0000 mg | Freq: Two times a day (BID) | INTRAVENOUS | Status: DC

## 2019-09-08 MED ORDER — HEPARIN BOLUS VIA INFUSION
1000.0000 [IU] | Freq: Once | INTRAVENOUS | Status: AC
  Administered 2019-09-08: 1000 [IU] via INTRAVENOUS
  Filled 2019-09-08: qty 1000

## 2019-09-08 MED ORDER — NOREPINEPHRINE 4 MG/250ML-% IV SOLN
0.0000 ug/min | INTRAVENOUS | Status: DC
  Administered 2019-09-08: 12 ug/min via INTRAVENOUS
  Administered 2019-09-08: 8 ug/min via INTRAVENOUS
  Administered 2019-09-08: 5 ug/min via INTRAVENOUS
  Filled 2019-09-08 (×3): qty 250

## 2019-09-08 MED ORDER — LEVETIRACETAM IN NACL 500 MG/100ML IV SOLN
500.0000 mg | Freq: Once | INTRAVENOUS | Status: AC
  Administered 2019-09-08: 500 mg via INTRAVENOUS
  Filled 2019-09-08: qty 100

## 2019-09-08 MED ORDER — FENTANYL CITRATE (PF) 100 MCG/2ML IJ SOLN
INTRAMUSCULAR | Status: AC
  Administered 2019-09-08: 100 ug via INTRAVENOUS
  Filled 2019-09-08: qty 2

## 2019-09-08 MED ORDER — VALPROATE SODIUM 500 MG/5ML IV SOLN
1400.0000 mg | Freq: Once | INTRAVENOUS | Status: AC
  Administered 2019-09-08: 1400 mg via INTRAVENOUS
  Filled 2019-09-08: qty 14

## 2019-09-08 MED ORDER — FENTANYL 2500MCG IN NS 250ML (10MCG/ML) PREMIX INFUSION
100.0000 ug/h | INTRAVENOUS | Status: DC
  Administered 2019-09-08 – 2019-09-09 (×2): 100 ug/h via INTRAVENOUS
  Filled 2019-09-08 (×2): qty 250

## 2019-09-08 MED ORDER — SODIUM CHLORIDE 0.9 % IV SOLN
INTRAVENOUS | Status: DC | PRN
  Administered 2019-09-08: 250 mL via INTRAVENOUS

## 2019-09-08 MED ORDER — MIDAZOLAM HCL 2 MG/2ML IJ SOLN
2.0000 mg | INTRAMUSCULAR | Status: DC | PRN
  Administered 2019-09-08 – 2019-09-09 (×4): 2 mg via INTRAVENOUS
  Filled 2019-09-08 (×4): qty 2

## 2019-09-08 MED ORDER — SODIUM CHLORIDE 0.9 % IV SOLN: INTRAVENOUS | Status: DC

## 2019-09-08 MED ORDER — LEVETIRACETAM IN NACL 1000 MG/100ML IV SOLN
1000.0000 mg | Freq: Two times a day (BID) | INTRAVENOUS | Status: DC
  Filled 2019-09-08 (×2): qty 100

## 2019-09-08 MED ORDER — ASPIRIN 81 MG PO CHEW
81.0000 mg | CHEWABLE_TABLET | Freq: Every day | ORAL | Status: DC
  Administered 2019-09-09: 81 mg
  Filled 2019-09-08: qty 1

## 2019-09-08 MED ORDER — NOREPINEPHRINE 16 MG/250ML-% IV SOLN
0.0000 ug/min | INTRAVENOUS | Status: DC
  Administered 2019-09-09: 12 ug/min via INTRAVENOUS
  Filled 2019-09-08: qty 250

## 2019-09-08 MED ORDER — CHLORHEXIDINE GLUCONATE 0.12% ORAL RINSE (MEDLINE KIT)
15.0000 mL | Freq: Two times a day (BID) | OROMUCOSAL | Status: DC
  Administered 2019-09-08 – 2019-09-09 (×2): 15 mL via OROMUCOSAL

## 2019-09-08 MED ORDER — FENTANYL CITRATE (PF) 100 MCG/2ML IJ SOLN: 100.0000 ug | Freq: Once | INTRAMUSCULAR | Status: AC

## 2019-09-08 MED ORDER — PERFLUTREN LIPID MICROSPHERE
1.0000 mL | INTRAVENOUS | Status: AC | PRN
  Administered 2019-09-08: 2 mL via INTRAVENOUS
  Filled 2019-09-08: qty 10

## 2019-09-08 MED ORDER — HEPARIN (PORCINE) 25000 UT/250ML-% IV SOLN
1200.0000 [IU]/h | INTRAVENOUS | Status: DC
  Administered 2019-09-08: 1200 [IU]/h via INTRAVENOUS
  Administered 2019-09-08: 1050 [IU]/h via INTRAVENOUS
  Filled 2019-09-08 (×2): qty 250

## 2019-09-08 MED ORDER — PROPOFOL 1000 MG/100ML IV EMUL
25.0000 ug/kg/min | INTRAVENOUS | Status: DC
  Administered 2019-09-08: 25 ug/kg/min via INTRAVENOUS
  Administered 2019-09-08 (×2): 80 ug/kg/min via INTRAVENOUS
  Administered 2019-09-08: 60 ug/kg/min via INTRAVENOUS
  Administered 2019-09-08 – 2019-09-09 (×5): 80 ug/kg/min via INTRAVENOUS
  Filled 2019-09-08 (×10): qty 100

## 2019-09-08 MED ORDER — VECURONIUM BROMIDE 10 MG IV SOLR: 10.0000 mg | Freq: Once | INTRAVENOUS | Status: AC

## 2019-09-08 MED ORDER — ASPIRIN 300 MG RE SUPP: 300.0000 mg | RECTAL | Status: DC

## 2019-09-08 MED ORDER — LEVETIRACETAM IN NACL 500 MG/100ML IV SOLN
500.0000 mg | Freq: Two times a day (BID) | INTRAVENOUS | Status: DC
  Administered 2019-09-08: 500 mg via INTRAVENOUS
  Filled 2019-09-08 (×2): qty 100

## 2019-09-08 MED ORDER — VALPROATE SODIUM 500 MG/5ML IV SOLN
15.0000 mg/kg/d | Freq: Three times a day (TID) | INTRAVENOUS | Status: DC
  Administered 2019-09-09 (×2): 363 mg via INTRAVENOUS
  Filled 2019-09-08 (×4): qty 3.63

## 2019-09-08 MED ORDER — ORAL CARE MOUTH RINSE
15.0000 mL | OROMUCOSAL | Status: DC
  Administered 2019-09-08 – 2019-09-09 (×11): 15 mL via OROMUCOSAL

## 2019-09-08 MED ORDER — INSULIN ASPART 100 UNIT/ML ~~LOC~~ SOLN
0.0000 [IU] | SUBCUTANEOUS | Status: DC
  Administered 2019-09-08: 5 [IU] via SUBCUTANEOUS
  Administered 2019-09-09 (×2): 2 [IU] via SUBCUTANEOUS
  Filled 2019-09-08 (×3): qty 1

## 2019-09-08 MED ORDER — LACTATED RINGERS IV SOLN: INTRAVENOUS | Status: DC

## 2019-09-08 MED ORDER — CHLORHEXIDINE GLUCONATE CLOTH 2 % EX PADS
6.0000 | MEDICATED_PAD | Freq: Every day | CUTANEOUS | Status: DC
  Administered 2019-09-08 – 2019-09-09 (×2): 6 via TOPICAL

## 2019-09-08 MED ORDER — VANCOMYCIN HCL IN DEXTROSE 1-5 GM/200ML-% IV SOLN: 1000.0000 mg | INTRAVENOUS | Status: DC

## 2019-09-08 MED ORDER — VECURONIUM BROMIDE 10 MG IV SOLR
INTRAVENOUS | Status: AC
  Administered 2019-09-08: 10 mg via INTRAVENOUS
  Filled 2019-09-08: qty 10

## 2019-09-08 MED ORDER — FENTANYL CITRATE (PF) 100 MCG/2ML IJ SOLN
50.0000 ug | Freq: Once | INTRAMUSCULAR | Status: AC
  Administered 2019-09-08: 50 ug via INTRAVENOUS
  Filled 2019-09-08: qty 2

## 2019-09-08 MED ORDER — LEVETIRACETAM IN NACL 1000 MG/100ML IV SOLN
1000.0000 mg | Freq: Two times a day (BID) | INTRAVENOUS | Status: DC
  Administered 2019-09-08 – 2019-09-09 (×3): 1000 mg via INTRAVENOUS
  Filled 2019-09-08 (×4): qty 100

## 2019-09-08 NOTE — Progress Notes (Signed)
[place] for heparin Indication: chest pain/ACS  No Known Allergies  Patient Measurements: Height: _0  (172.7 cm) Weight: 72.5 kg (159 lb 13.3 oz) IBW/kg (Calculated) : 68.4 Heparin Dosing Weight: 77.4 kg  Vital Signs: Temp: 96.6 F (35.9 C) (09/07 1400) Temp Source: Bladder (09/07 1400) BP: 87/55 (09/07 1400) Pulse Rate: 56 (09/07 1400)  Labs: Recent Labs    09/07/2019 2347 09/08/19 0339 09/08/19 1115 09/08/19 1324  HGB 11.2* 11.0*  --   --   HCT 36.0* 35.2*  --   --   PLT 384 364  --   --   APTT 33 83*  --   --   LABPROT 14.4 14.4  --   --   INR 1.2 1.2  --   --   HEPARINUNFRC  --   --   --  0.27*  CREATININE 2.32* 1.99* 1.92*  --   TROPONINIHS 11,411* 12,764* 9,141*  --     Estimated Creatinine Clearance: 37.1 mL/min (A) (by C-G formula based on SCr of 1.92 mg/dL (H)).   Medical History: History reviewed. No pertinent past medical history.  Medications:  Scheduled:  . [START ON 09/21/19] aspirin  81 mg Per Tube Daily  . chlorhexidine gluconate (MEDLINE KIT)  15 mL Mouth Rinse BID  . Chlorhexidine Gluconate Cloth  6 each Topical Q0600  . insulin aspart  0-15 Units Subcutaneous Q4H  . mouth rinse  15 mL Mouth Rinse 10 times per day    Assessment: Patient admitted w/ CPR in progress w/ trops elevated (5 minutes CPR was given). CXR chest shows diffuse bilateral airspace disease. No anticoag PTA. Patient being started on heparin drip for ACS/NSTEMI.  Goal of Therapy:  Heparin level 0.3-0.7 units/ml Monitor platelets by anticoagulation protocol: Yes   Plan:  --09/07 at 1324 HL 0.27, slightly subtherapeutic. Will order heparin 1000 unit bolus IV x 1 and increase infusion rate to 1200 units/hr --Re-check HL 6 hours after rate change --Daily CBC per protocol  Benita Gutter 09/08/2019,2:29 PM

## 2019-09-08 NOTE — Progress Notes (Signed)
Inpatient Diabetes Program Recommendations  AACE/ADA: New Consensus Statement on Inpatient Glycemic Control (2015)  Target Ranges:  Prepandial:   less than 140 mg/dL      Peak postprandial:   less than 180 mg/dL (1-2 hours)      Critically ill patients:  140 - 180 mg/dL   Lab Results  Component Value Date   GLUCAP 117 (H) 09/08/2019    Review of Glycemic Control Results for Michael Ramsey, Michael Ramsey (MRN [id number]) as of 09/08/2019 13:53  Ref. Range 10/01/2019 23:43 09/08/2019 02:44 09/08/2019 07:17 09/08/2019 11:33  Glucose-Capillary Latest Ref Range: 70 - 99 mg/dL 259 (H) 217 (H) 89 117 (H)   Diabetes history: No DM hx noted Current orders for Inpatient glycemic control: Novolog moderate correction (0-15 units) q 4 hrs.  Inpatient Diabetes Program Recommendations:   Consider  Due to patient on ventilator & NPO: -Adult ICU Glycemic control order set  Thank you, Nani Gasser. Reshawn Ostlund, RN, MSN, CDE  Diabetes Coordinator Inpatient Glycemic Control Team Team Pager [phone] (8am-5pm) 09/08/2019 1:57 PM

## 2019-09-08 NOTE — Consult Note (Signed)
[address] [place], [place] [address] Phone [phone]. Fax [phone]  Date: 09/08/2019                  Patient Name:  Michael Ramsey  MRN: [id number]  DOB: August 08, 1955  Age / Sex: 64 y.o., male         PCP: Marguerita Merles, MD                 Service Requesting Consult: IM/ Flora Lipps, MD                 Reason for Consult: ARF            History of Present Illness: Patient is a 64 y.o. male admitted to [facility] on 09/06/2019 for evaluation of Cardiac arrest ([place]) [I46.9] ST elevation myocardial infarction (STEMI), unspecified artery ([place]) [I21.3]   Patient presented to the emergency room for cardiac arrest.  He was found unconscious with agonal breathing by EMS.  He underwent CPR and required administration of epinephrine.  He was intubated in the field.  Nephrology consult has now been requested to evaluate for acute kidney injury.  Presenting creatinine of 2.32 which has improved to 1.92 today. Troponin is elevated as expected.  Urine output of 495 cc so far. Potassium trend decreasing to 3.8. Patient is acidotic with CO2 of 16.   Medications: Outpatient medications: Medications Prior to Admission  Medication Sig Dispense Refill Last Dose  . amLODipine (NORVASC) 10 MG tablet Take 10 mg by mouth daily.   unknown at unknown  . carvedilol (COREG) 12.5 MG tablet Take 12.5 mg by mouth 2 (two) times daily.   unknown at unknown  . cetirizine (ZYRTEC) 10 MG tablet Take 10 mg by mouth daily.   unknown at unknown  . Colchicine 0.6 MG CAPS Take 1 capsule by mouth daily.   unknown at unknown  . predniSONE (DELTASONE) 10 MG tablet Take 10 mg by mouth See admin instructions. Take 1 dose pack by oral route as directed.   unknown at unknown    Current medications: Current Facility-Administered Medications  Medication Dose Route Frequency Provider Last Rate Last Admin  . 0.9 %  sodium chloride infusion   Intravenous Continuous Bradly Bienenstock, NP 75 mL/hr at 09/08/19  0946 Rate Verify at 09/08/19 0946  . 0.9 %  sodium chloride infusion   Intravenous PRN Bradly Bienenstock, NP   Stopped at 09/08/19 0945  . ceFEPIme (MAXIPIME) 2 g in sodium chloride 0.9 % 100 mL IVPB  2 g Intravenous Q12H Darel Hong D, NP 200 mL/hr at 09/08/19 1047 2 g at 09/08/19 1047  . chlorhexidine gluconate (MEDLINE KIT) (PERIDEX) 0.12 % solution 15 mL  15 mL Mouth Rinse BID Flora Lipps, MD      . Chlorhexidine Gluconate Cloth 2 % PADS 6 each  6 each Topical Q0600 Bradly Bienenstock, NP   6 each at 09/08/19 0400  . famotidine (PEPCID) IVPB 20 mg premix  20 mg Intravenous Daily Bradly Bienenstock, NP   Stopped at 09/08/19 0155  . fentaNYL (SUBLIMAZE) bolus via infusion 25 mcg  25 mcg Intravenous Q30 min PRN Darel Hong D, NP      . fentaNYL 2576mg in NS 2563m(1071mml) infusion-PREMIX  100-300 mcg/hr Intravenous Continuous KeeDarel Hong NP 10 mL/hr at 09/08/19 0946 100 mcg/hr at 09/08/19 0946  . heparin ADULT infusion 100 units/mL (25000 units/250m85mdium chloride 0.45%)  1,050 Units/hr Intravenous Continuous [place]ero[place]fred [place] 10.5 mL/hr  at 09/08/19 0946 1,050 Units/hr at 09/08/19 0946  . insulin aspart (novoLOG) injection 0-15 Units  0-15 Units Subcutaneous Q4H Darel Hong D, NP   5 Units at 09/08/19 0314  . levETIRAcetam (KEPPRA) IVPB 1000 mg/100 mL premix  1,000 mg Intravenous Q12H Kerney Elbe, MD 400 mL/hr at 09/08/19 0946 Rate Verify at 09/08/19 0946  . MEDLINE mouth rinse  15 mL Mouth Rinse 10 times per day Flora Lipps, MD   15 mL at 09/08/19 0941  . midazolam (VERSED) injection 2 mg  2 mg Intravenous Q2H PRN Darel Hong D, NP   2 mg at 09/08/19 1962  . norepinephrine (LEVOPHED) 57m in 2555mpremix infusion  0-50 mcg/min Intravenous Titrated KeDarel Hong, NP 15 mL/hr at 09/08/19 0946 4 mcg/min at 09/08/19 0946  . propofol (DIPRIVAN) 1000 MG/100ML infusion  25-80 mcg/kg/min Intravenous Continuous KeDarel Hong, NP 27.9 mL/hr at 09/08/19 0946 60  mcg/kg/min at 09/08/19 0946      Allergies: No Known Allergies    Past Medical History: History reviewed. No pertinent past medical history.   Past Surgical History: History reviewed. No pertinent surgical history.   Family History: No family history on file.   Social History: Social History   Socioeconomic History  . Marital status: Divorced    Spouse name: Not on file  . Number of children: Not on file  . Years of education: Not on file  . Highest education level: Not on file  Occupational History  . Not on file  Tobacco Use  . Smoking status: Not on file  Substance and Sexual Activity  . Alcohol use: Not on file  . Drug use: Not on file  . Sexual activity: Not on file  Other Topics Concern  . Not on file  Social History Narrative  . Not on file   Social Determinants of Health   Financial Resource Strain:   . Difficulty of Paying Living Expenses: Not on file  Food Insecurity:   . Worried About RuCharity fundraisern the Last Year: Not on file  . Ran Out of Food in the Last Year: Not on file  Transportation Needs:   . Lack of Transportation (Medical): Not on file  . Lack of Transportation (Non-Medical): Not on file  Physical Activity:   . Days of Exercise per Week: Not on file  . Minutes of Exercise per Session: Not on file  Stress:   . Feeling of Stress : Not on file  Social Connections:   . Frequency of Communication with Friends and Family: Not on file  . Frequency of Social Gatherings with Friends and Family: Not on file  . Attends Religious Services: Not on file  . Active Member of Clubs or Organizations: Not on file  . Attends ClArchivisteetings: Not on file  . Marital Status: Not on file  Intimate Partner Violence:   . Fear of Current or Ex-Partner: Not on file  . Emotionally Abused: Not on file  . Physically Abused: Not on file  . Sexually Abused: Not on file     Review of Systems: Not available as patient is intubated and  sedated Gen:  HEENT:  CV:  Resp:  GI: GU :  MS:  Derm:    Psych: Heme:  Neuro:  Endocrine  Vital Signs: Blood pressure (!) 87/73, pulse 67, temperature (!) 96.1 F (35.6 C), temperature source Bladder, resp. rate (!) 21, height 5' 8"  (1.727 m), weight 72.5 kg, SpO2 100 %.  Intake/Output Summary (Last 24 hours) at 09/08/2019 1057 Last data filed at 09/08/2019 0946 Gross per 24 hour  Intake 1588.73 ml  Output 540 ml  Net 1048.73 ml    Weight trends: Filed Weights   09/28/2019 2345 09/08/19 0245  Weight: 77.4 kg 72.5 kg    Physical Exam: General:  Critically ill-appearing  HEENT  ET tube, OG tube to suction  Lungs:  Ventilator assisted  Heart::  Regular, tachycardic  Abdomen:  Soft, nondistended  Extremities:  Trace edema  Neurologic:  Sedated  Skin:  No acute rashes  Access:   Foley:  Foley in place       Lab results: Basic Metabolic Panel: Recent Labs  Lab 09/24/2019 2347 09/08/19 0339  NA 140 139  K 4.4 3.9  CL 105 107  CO2 21* 18*  GLUCOSE 278* 215*  BUN 42* 44*  CREATININE 2.32* 1.99*  CALCIUM 8.2* 8.2*    Liver Function Tests: Recent Labs  Lab 09/08/19 0339  AST 40  ALT 35  ALKPHOS 87  BILITOT 0.8  PROT 6.9  ALBUMIN 3.4*   No results for input(s): LIPASE, AMYLASE in the last 168 hours. No results for input(s): AMMONIA in the last 168 hours.  CBC: Recent Labs  Lab 09/20/2019 2347 09/08/19 0339  WBC 14.1* 13.5*  NEUTROABS 8.8* 11.8*  HGB 11.2* 11.0*  HCT 36.0* 35.2*  MCV 96.0 94.4  PLT 384 364    Cardiac Enzymes: No results for input(s): CKTOTAL, TROPONINI in the last 168 hours.  BNP: Invalid input(s): POCBNP  CBG: Recent Labs  Lab 09/19/2019 2343 09/08/19 0244 09/08/19 0717  GLUCAP 259* 217* 89    Microbiology: Recent Results (from the past 720 hour(s))  SARS Coronavirus 2 by RT PCR (hospital order, performed in [facility] hospital lab) Nasopharyngeal Nasopharyngeal Swab     Status: None   Collection Time: 09/22/2019  11:47 PM   Specimen: Nasopharyngeal Swab  Result Value Ref Range Status   SARS Coronavirus 2 NEGATIVE NEGATIVE Final    Comment: (NOTE) SARS-CoV-2 target nucleic acids are NOT DETECTED.  The SARS-CoV-2 RNA is generally detectable in upper and lower respiratory specimens during the acute phase of infection. The lowest concentration of SARS-CoV-2 viral copies this assay can detect is 250 copies / mL. A negative result does not preclude SARS-CoV-2 infection and should not be used as the sole basis for treatment or other patient management decisions.  A negative result may occur with improper specimen collection / handling, submission of specimen other than nasopharyngeal swab, presence of viral mutation(s) within the areas targeted by this assay, and inadequate number of viral copies (<250 copies / mL). A negative result must be combined with clinical observations, patient history, and epidemiological information.  Fact Sheet for Patients:   StrictlyIdeas.no  Fact Sheet for Healthcare Providers: BankingDealers.co.za  This test is not yet approved or  cleared by the [place] FDA and has been authorized for detection and/or diagnosis of SARS-CoV-2 by FDA under an Emergency Use Authorization (EUA).  This EUA will remain in effect (meaning this test can be used) for the duration of the COVID-19 declaration under Section 564(b)(1) of the Act, 21 U.S.C. section 360bbb-3(b)(1), unless the authorization is terminated or revoked sooner.  Performed at [facility], [address] [place], [place] [address]   CULTURE, BLOOD (ROUTINE X 2) w Reflex to ID Panel     Status: None (Preliminary result)   Collection Time: 09/08/19  1:14 AM   Specimen: BLOOD  Result Value Ref Range Status   Specimen Description BLOOD LT FOREARM  Final   Special Requests   Final    BOTTLES DRAWN AEROBIC AND ANAEROBIC Blood Culture adequate volume    Culture   Final    NO GROWTH < 12 HOURS Performed at [facility], [address], [place], [place] [address]    Report Status PENDING  Incomplete  CULTURE, BLOOD (ROUTINE X 2) w Reflex to ID Panel     Status: None (Preliminary result)   Collection Time: 09/08/19  1:14 AM   Specimen: BLOOD  Result Value Ref Range Status   Specimen Description BLOOD RAC  Final   Special Requests   Final    BOTTLES DRAWN AEROBIC AND ANAEROBIC Blood Culture adequate volume   Culture   Final    NO GROWTH < 12 HOURS Performed at [facility], [address], [place], [place] [address]    Report Status PENDING  Incomplete  MRSA PCR Screening     Status: None   Collection Time: 09/08/19  2:44 AM   Specimen: Nasopharyngeal  Result Value Ref Range Status   MRSA by PCR NEGATIVE NEGATIVE Final    Comment:        The GeneXpert MRSA Assay (FDA approved for NASAL specimens only), is one component of a comprehensive MRSA colonization surveillance program. It is not intended to diagnose MRSA infection nor to guide or monitor treatment for MRSA infections. Performed at [facility], [address] [place],  [address]      Coagulation Studies: Recent Labs    09/11/2019 02-Mar-2345 09/08/19 0339  LABPROT 14.4 14.4  INR 1.2 1.2    Urinalysis: Recent Labs    09/06/2019 2347  COLORURINE YELLOW*  LABSPEC 1.018  PHURINE 5.0  GLUCOSEU 50*  HGBUR NEGATIVE  BILIRUBINUR NEGATIVE  KETONESUR NEGATIVE  PROTEINUR >=300*  NITRITE NEGATIVE  LEUKOCYTESUR NEGATIVE        Imaging: DG Abdomen 1 View  Result Date: 09/08/2019 CLINICAL DATA:  Orogastric tube placement EXAM: ABDOMEN - 1 VIEW COMPARISON:  None. FINDINGS: Tip of the orogastric tube is in the stomach. The side port is just below the gastroesophageal junction. Recommend advancing 5 cm. Large amount of stool in the left colon. IMPRESSION: Orogastric tube side port just below the gastroesophageal junction. Recommend  advancing 5 cm. Electronically Signed   By: Ulyses Jarred M.D.   On: 09/08/2019 00:36   CT Head Wo Contrast  Result Date: 09/08/2019 CLINICAL DATA:  Mental status changes EXAM: CT HEAD WITHOUT CONTRAST TECHNIQUE: Contiguous axial images were obtained from the base of the skull through the vertex without intravenous contrast. COMPARISON:  10/29/2007 FINDINGS: Brain: There is atrophy and chronic small vessel disease changes. No acute intracranial abnormality. Specifically, no hemorrhage, hydrocephalus, mass lesion, acute infarction, or significant intracranial injury. Vascular: No hyperdense vessel or unexpected calcification. Skull: No acute calvarial abnormality. Sinuses/Orbits: Visualized paranasal sinuses and mastoids clear. Orbital soft tissues unremarkable. Other: None IMPRESSION: Atrophy, chronic microvascular disease. No acute intracranial abnormality. Electronically Signed   By: Rolm Baptise M.D.   On: 09/08/2019 00:27   DG Chest Portable 1 View  Result Date: 09/08/2019 CLINICAL DATA:  Intubated EXAM: PORTABLE CHEST 1 VIEW COMPARISON:  05/15/2019 FINDINGS: Endotracheal tube is 1.6 cm above the carina. NG tube is in the stomach. Heart is upper limits normal in size. Diffuse bilateral airspace disease. No effusions. No acute bony abnormality. IMPRESSION: Support devices as above. Diffuse bilateral airspace disease, likely infection.  Electronically Signed   By: Rolm Baptise M.D.   On: 09/08/2019 00:36      Assessment & Plan: Pt is a 64 y.o.   male with CKD, gout, hypertension, was admitted on 09/03/2019 with Cardiac arrest ([place]) [I46.9] ST elevation myocardial infarction (STEMI), unspecified artery ([place]) [I21.3]     #Acute kidney injury Likely secondary to ATN caused by hemodynamic instability, hypotension, cardiac arrest Patient is currently nonoliguric and urine output appears to be improving Serum creatinine 2.32 at admission, decreased to 1.92 today Electrolytes and volume status are  acceptable. No acute indication for dialysis at present. We will continue to follow.  09/06 0701 - 09/07 0700 In: 1117.4 [I.V.:422.9; IV Piggyback:694.5] Out: 175 [Urine:175]  Lab Results  Component Value Date   CREATININE 1.92 (H) 09/08/2019   CREATININE 1.99 (H) 09/08/2019   CREATININE 2.32 (H) 43/53/9122     #Metabolic acidosis Likely secondary to renal insufficiency Recommend changing IV fluid supplementation to LR  #Acute respiratory failure Currently ventilator dependent Management as per ICU team.   LOS: 0 Ibrahima Holberg 9/7/202110:57 AM    Note: This note was prepared with Dragon dictation. Any transcription errors are unintentional

## 2019-09-08 NOTE — Progress Notes (Signed)
ANTICOAGULATION CONSULT NOTE - Initial Consult  Pharmacy Consult for heparin Indication: chest pain/ACS  Not on File  Patient Measurements: Height: 5\' 8"  (172.7 cm) Weight: 77.4 kg (170 lb 10.2 oz) IBW/kg (Calculated) : 68.4 Heparin Dosing Weight: 77.4 kg  Vital Signs: Pulse Rate: 105 (09/06 2344)  Labs: Recent Labs    09/11/2019 2347  HGB 11.2*  HCT 36.0*  PLT 384  APTT 33  LABPROT 14.4  INR 1.2  CREATININE 2.32*  TROPONINIHS 11,411*    Estimated Creatinine Clearance: 30.7 mL/min (A) (by C-G formula based on SCr of 2.32 mg/dL (H)).   Medical History: History reviewed. No pertinent past medical history.  Medications:  Scheduled:  . heparin  4,000 Units Intravenous Once    Assessment: Patient admitted w/ CPR in progress w/ trops elevated (5 minutes CPR was given). CXR chest shows diffuse bilateral airspace disease. No anticoag PTA. Patient being started on heparin drip for ACS/NSTEMI.  Goal of Therapy:  Heparin level 0.3-0.7 units/ml Monitor platelets by anticoagulation protocol: Yes   Plan:  Will bolus heparin 4000 units IV x 1. Will start rate at 1050 units/hr and will recheck anti-Xa at 0700. Will monitor daily CBC's and adjust per anti-Xa levels.  Tobie Lords, PharmD, BCPS Clinical Pharmacist 09/08/2019,12:50 AM

## 2019-09-08 NOTE — Progress Notes (Signed)
eeg done °

## 2019-09-08 NOTE — Procedures (Signed)
Central Venous Catheter Insertion Procedure Note  Michael Ramsey  [id number]  12/10/1955  Date:09/08/19  Time:7:34 AM   Provider Performing:Effie Wahlert D Dewaine Conger   Procedure: Insertion of Non-tunneled Central Venous Catheter(36556) with US guidance (03403)   Indication(s) Medication administration and Difficult access  Consent Unable to obtain consent due to emergent nature of procedure.  Anesthesia Topical only with 1% lidocaine   Timeout Verified patient identification, verified procedure, site/side was marked, verified correct patient position, special equipment/implants available, medications/allergies/relevant history reviewed, required imaging and test results available.  Sterile Technique Maximal sterile technique including full sterile barrier drape, hand hygiene, sterile gown, sterile gloves, mask, hair covering, sterile ultrasound probe cover (if used).  Procedure Description Area of catheter insertion was cleaned with chlorhexidine and draped in sterile fashion.  With real-time ultrasound guidance a central venous catheter was placed into the left femoral vein. Nonpulsatile blood flow and easy flushing noted in all ports.  The catheter was sutured in place and sterile dressing applied.  Complications/Tolerance None; patient tolerated the procedure well. Chest X-ray is ordered to verify placement for internal jugular or subclavian cannulation.   Chest x-ray is not ordered for femoral cannulation.  EBL Minimal  Specimen(s) None   Line secured at the 20 cm mark.  BIOPATCH applied to the insertion site.   Darel Hong, AGACNP-BC [facility] Pager: [phone]

## 2019-09-08 NOTE — ED Notes (Signed)
Pt to CT with RN and respiratory at bedside.

## 2019-09-08 NOTE — Progress Notes (Signed)
*  PRELIMINARY RESULTS* Echocardiogram 2D Echocardiogram has been performed.  Michael Ramsey 09/08/2019, 3:29 PM

## 2019-09-08 NOTE — Progress Notes (Signed)
ANTICOAGULATION CONSULT NOTE - Initial Consult  Pharmacy Consult for heparin Indication: chest pain/ACS  No Known Allergies  Patient Measurements: Height: 5\' 8"  (172.7 cm) Weight: 72.5 kg (159 lb 13.3 oz) IBW/kg (Calculated) : 68.4 Heparin Dosing Weight: 77.4 kg  Vital Signs: Temp: 97.7 F (36.5 C) (09/07 0600) Temp Source: Bladder (09/07 0600) BP: 95/66 (09/07 0600) Pulse Rate: 62 (09/07 0600)  Labs: Recent Labs    09/04/2019 2347 09/08/19 0339  HGB 11.2* 11.0*  HCT 36.0* 35.2*  PLT 384 364  APTT 33 83*  LABPROT 14.4 14.4  INR 1.2 1.2  CREATININE 2.32* 1.99*  TROPONINIHS 11,411* 12,764*    Estimated Creatinine Clearance: 35.8 mL/min (A) (by C-G formula based on SCr of 1.99 mg/dL (H)).   Medical History: History reviewed. No pertinent past medical history.  Medications:  Scheduled:  . Chlorhexidine Gluconate Cloth  6 each Topical Q0600  . insulin aspart  0-15 Units Subcutaneous Q4H    Assessment: Patient admitted w/ CPR in progress w/ trops elevated (5 minutes CPR was given). CXR chest shows diffuse bilateral airspace disease. No anticoag PTA. Patient being started on heparin drip for ACS/NSTEMI.  Goal of Therapy:  Heparin level 0.3-0.7 units/ml Monitor platelets by anticoagulation protocol: Yes   Plan:  09/07 @ 0730 RN calls stating lab has not collected HL yet as drip has been stopped since 0300 for central line placement. Confirmed w/ RN to restart drip at 1050 units/hr and will reschedule HL for 1330 and continue to monitor.  Tobie Lords, PharmD, BCPS Clinical Pharmacist 09/08/2019,7:23 AM

## 2019-09-08 NOTE — H&P (Addendum)
Name: Michael Ramsey MRN: [id number] DOB: January 16, 1955    ADMISSION DATE:  09/06/2019 CONSULTATION DATE:  09/08/2019  REFERRING MD :  Dr. Alfred Levins  CHIEF COMPLAINT:  Cardiac arrest  BRIEF PATIENT DESCRIPTION:  64 y.o. Male admitted with Cardiac arrest in setting of STEMI. Placed on Targeted Temperature Management at 36 C.  Noted to have severe myoclonus.  SIGNIFICANT EVENTS  9/6: Presents to ED s/p cardiac arrest 9/7: PCCM asked to admit; start TTM; Cardiology, Neurology, and Nephrology consulted  STUDIES:  9/7: CT Head>>Atrophy, chronic microvascular disease. No acute intracranial abnormality. 9/7: CXR>>Endotracheal tube is 1.6 cm above the carina. NG tube is in the stomach. Heart is upper limits normal in size. Diffuse bilateral airspace disease. No effusions. No acute bony abnormality  CULTURES: SARS-CoV-2 PCR 9/7>>negative Respiratory Viral Panel 9/7>> Blood cultures x2 9/7>> Tracheal Aspirate 9/7>> Strep pneumo urinary antigen 9/7>> Legionella urinary antigen 9/7>> Urine 9/7>>  ANTIBIOTICS: Cefepime 9/7>> Vancomycin 9/7>>  HISTORY OF PRESENT ILLNESS:   Michael Ramsey is a 64 y.o. Male with a past medical history signigticant for CKD, gout, and HTN who presents to [facility] ED on 09/17/2019 status post out of hospital cardiac arrest.  Patient was at home alone of which he contacted EMS due to respiratory distress.  Upon EMS arrival he was unconscious with agonal breathing and a thready pulse.  He then lost pulses and was found to be in asystole.  He underwent 1 round of CPR and 1 Epi in the field with ROSC obtained (estimated time of resuscitation is 5 minutes).  He was intubated in the field with no paralytics or sedation.  He was noted to become hypotensive, of which he was placed on Dopamine.    Upon arrival to ED he is noted to be posturing, have no cough or gag reflexes, with fixed and dilated pupils, and with a GCS of 3.  Initial workup in the ED revealed  Glucose 278, BUN 42,  Creatinine 2.32, Anion gap 14, lactic acid 4.3, WBC 14.1, and high sensitivity troponin 11,411. Post intubation ABG with pH 7.13/pCO2 54/pO2 431/ Bicarb 18.  EKG with ST elevation in leads V2-V4, and inversions in lateral leads.  CXR with diffuse bilateral airspace disease (concerning for infection).  His COVID-19 PCR is pending.  CT Head is negative for any acute intracranial abnormality.  ED Provider discussed case with Dr. Fletcher Anon of Cardiology who recommends medical management with Heparin drip and targeted temperature management, with reassessment in the morning for possible cardiac catheterization.  PCCM is asked to admit the patient to ICU for further workup and treatment of Cardiac arrest in the setting of STEMI.  Targeted Temperature Management (36 C) is being initiated. Clinical exam is concerning for possible anoxic brain injury.  Cardiology, Neurology, and Nephrology are consulted.  After admission to ICU pt noted to have severe Myoclonus, started on IV Keppra.   PAST MEDICAL HISTORY :   has no past medical history on file.  has no past surgical history on file. Prior to Admission medications   Not on File   Not on File  FAMILY HISTORY:  family history is not on file. SOCIAL HISTORY:     COVID-19 DISASTER DECLARATION:  FULL CONTACT PHYSICAL EXAMINATION WAS NOT POSSIBLE DUE TO TREATMENT OF COVID-19 AND  CONSERVATION OF PERSONAL PROTECTIVE EQUIPMENT, LIMITED EXAM FINDINGS INCLUDE-  Patient assessed or the symptoms described in the history of present illness.  In the context of the Global COVID-19 pandemic, which necessitated consideration that the patient  might be at risk for infection with the SARS-CoV-2 virus that causes COVID-19, Institutional protocols and algorithms that pertain to the evaluation of patients at risk for COVID-19 are in a state of rapid change based on information released by regulatory bodies including the CDC and federal and state organizations. These  policies and algorithms were followed during the patient's care while in hospital.  REVIEW OF SYSTEMS:   Unable to assess due to critical illness & intubation  SUBJECTIVE:  Unable to assess due to critical illness & intubation  VITAL SIGNS: Pulse Rate:  [105] 105 (09/06 2344) Resp:  [24] 24 (09/06 2344) SpO2:  [100 %] 100 % (09/07 0027) FiO2 (%):  [100 %] 100 % (09/07 0027) Weight:  [77.4 kg] 77.4 kg (09/06 2345)  PHYSICAL EXAMINATION: General:  Critically ill appearing male, intubated, unresponsive on no sedation, in NAD Neuro:  Unresponsive (on no sedation), Decorticate posturing to pain, positive cough/gag reflex, pupils PERRL HEENT:  Atraumatic, normocephalic, neck supple, no JVD Cardiovascular:  Regular rate and rhythm, s1s2, no M/R/G, 2+ distal pulses Lungs:  Coarse breath sounds to auscultation bilaterally, no wheezing or rales, vent assisted, even Abdomen:  Soft, nontender, nondistended, no guarding or rebound tenderness, BS+ x4 Musculoskeletal:  No deformities, no edema Skin:  Warm and dry.  No obvious rashes, lesions, or ulcerations  Recent Labs  Lab 09/22/2019 2347  NA 140  K 4.4  CL 105  CO2 21*  BUN 42*  CREATININE 2.32*  GLUCOSE 278*   Recent Labs  Lab 09/11/2019 2347  HGB 11.2*  HCT 36.0*  WBC 14.1*  PLT 384   DG Abdomen 1 View  Result Date: 09/08/2019 CLINICAL DATA:  Orogastric tube placement EXAM: ABDOMEN - 1 VIEW COMPARISON:  None. FINDINGS: Tip of the orogastric tube is in the stomach. The side port is just below the gastroesophageal junction. Recommend advancing 5 cm. Large amount of stool in the left colon. IMPRESSION: Orogastric tube side port just below the gastroesophageal junction. Recommend advancing 5 cm. Electronically Signed   By: Ulyses Jarred M.D.   On: 09/08/2019 00:36   CT Head Wo Contrast  Result Date: 09/08/2019 CLINICAL DATA:  Mental status changes EXAM: CT HEAD WITHOUT CONTRAST TECHNIQUE: Contiguous axial images were obtained from the  base of the skull through the vertex without intravenous contrast. COMPARISON:  10/29/2007 FINDINGS: Brain: There is atrophy and chronic small vessel disease changes. No acute intracranial abnormality. Specifically, no hemorrhage, hydrocephalus, mass lesion, acute infarction, or significant intracranial injury. Vascular: No hyperdense vessel or unexpected calcification. Skull: No acute calvarial abnormality. Sinuses/Orbits: Visualized paranasal sinuses and mastoids clear. Orbital soft tissues unremarkable. Other: None IMPRESSION: Atrophy, chronic microvascular disease. No acute intracranial abnormality. Electronically Signed   By: Rolm Baptise M.D.   On: 09/08/2019 00:27   DG Chest Portable 1 View  Result Date: 09/08/2019 CLINICAL DATA:  Intubated EXAM: PORTABLE CHEST 1 VIEW COMPARISON:  05/15/2019 FINDINGS: Endotracheal tube is 1.6 cm above the carina. NG tube is in the stomach. Heart is upper limits normal in size. Diffuse bilateral airspace disease. No effusions. No acute bony abnormality. IMPRESSION: Support devices as above. Diffuse bilateral airspace disease, likely infection. Electronically Signed   By: Rolm Baptise M.D.   On: 09/08/2019 00:36    ASSESSMENT / PLAN:  Cardiac Arrest in the setting of STEMI Hx: HTN -Continous cardiac monitoring -Maintain MAP >65 -Vasopressors if needed to maintain MAP goal -Cardiology consulted, appreciate input (ED Provider discussed with Dr. Fletcher Anon who recommends TTM and  Heparin gtt for now, will reassess in the am about proceeding with CATH) -Trend troponin -Heparin gtt -Aspirin -Obtain 2D Echo  -Check urine drug screen -Trend lactic acid   Acute Hypoxic Hypercapnic Respiratory Failure in the setting of Cardiac Arrest & ? Pneumonia -Full vent support -Wean FiO2 & PEEP as tolerated to maintain O2 sats >92% -Follow intermittent CXR & ABG as needed -VAP Protocol -Spontaneous breathing trials when respiratory status met and if mental status  permits -Placed on Cefepime and Vancomycin for now -Prn Bronchodilators   ? Pneumonia UTI -Monitor fever curve -Trend WBC's & Procalcitonin -Follow cultures as above -COVID-19 PCR is negative 09/11/2019 -Placed on Cefepime & Vancomycin for now   AKI on CKD -Monitor I&O's / urinary output -Follow BMP -Ensure adequate renal perfusion -Avoid nephrotoxic agents as able -Replace electrolytes as indicated -IV Fluids -Consult Nephrology, appreciate input   Anemia -Monitor for S/Sx of bleeding -Trend CBC -Heparin gtt for VTE Prophylaxis/Anticoagulation  -Transfuse for Hgb <7   Hyperglycemia -CBG's -SSI -Follow ICU Hypo/hyperglycemia protocol   Unresponsive s/p Cardiac Arrest ? Anoxic Brain injury -Begin Targeted Temperature Management at 36 C -Fentanyl and Propofol gtts for sedation while on TTM -CT Head 9/7 negative -Decorticate posturing to painful stimuli on clinical exam with severe Myoclonus -Daily wake up assessments once TTM completed -Urine drug screen pending -EEG -Consult Neurology, appreciate input          Pt is critically ill with multiorgan failure.  Prognosis is guarded, he is high risk for cardiac arrest and death.   BEST PRACTICES DISPOSITION: ICU GOALS OF CARE: Full code VTE PROPHYLAXIS/ANTICOAGULATION: Heparin gtt SUP: IV Pepicd CONSULTS: Cardiology, Neurology, Nephrology UPDATES: No family at bedside for update during NP rounds.   Darel Hong, AGACNP-BC [facility] Pager: [phone]  09/08/2019, 12:58 AM

## 2019-09-08 NOTE — Procedures (Signed)
Patient Name: Michael Ramsey  MRN: [id number]  Epilepsy Attending: Lora Havens  Referring Physician/Provider: Darel Hong, NP Date: 09/08/2019 Duration: 24.46 minutes  Patient history: 64 year old male status post cardiac arrest and noted to have seizure-like episodes.  EEG to evaluate for seizures.  Level of alertness: Comatose  AEDs during EEG study: LEV, Propofol, Versed  Technical aspects: This EEG study was done with scalp electrodes positioned according to the 10-20 International system of electrode placement. Electrical activity was acquired at a sampling rate of 500Hz  and reviewed with a high frequency filter of 70Hz  and a low frequency filter of 1Hz . EEG data were recorded continuously and digitally stored.   Description: Patient was noted to have multiple episodes of generalized axial twitching.  Concomitant EEG showed generalized high amplitude polyspikes consistent with myoclonic seizures.  EEG also showed burst suppression pattern with EEG suppression lasting 3 to 5 seconds as well as generalized highly epileptiform bursts lasting 2 to 3 seconds. Hyperventilation and photic stimulation were not performed.     ABNORMALITY -Myoclonic seizures, generalized -Burst suppression with highly epileptiform bursts, generalized   IMPRESSION: This study showed multiple myoclonic seizures as well as profound diffuse encephalopathy suggestive of diffuse anoxic/hypoxic brain injury.  Dr. Kerney Elbe was notified.  Athanasius Kesling Barbra Sarks

## 2019-09-08 NOTE — Progress Notes (Signed)
eLink Physician-Brief Progress Note Patient Name: Michael Ramsey DOB: 1955/10/21 MRN: [id number]   Date of Service  09/08/2019  HPI/Events of Note  64 yr old out of hospital Cardiac arrest/resp failure. Cardiology placed hi on HT protocol/levo. Did not take to [facility] due to myoclonus. Hx of HTN, CKD  Camera: Discussed with bed side RN. NP placing orders On Levo at 5. 450/5/20/100%. VS stable.   Labs reviewed. Cr > 2. K ok. STEMI elevated troponine.   Covid neg. BG > 200.  CTH neg. CxR bilateral air space densities.  NG tube need advancement as per radiologist report.    Out of Hospital Cardiac arrest STEMI/HTN. Shock.  AHRF. On vent.  Aspiration PNA possible. Found down.  CKD.    eICU Interventions  Follow cardiology recommendation, HT/heparine gtt. LHC in AM if improvement in neuro status.  - lung protective ventilation, wean fio2 and Vt as tolerated. - BG goals < 180. - on empirical bax, pending cultures. - trend troponin levels/ ECHO. -keep MAP > 65 - keppra for suspected sz. Vs myoclonic jerks empirically.       Intervention Category Major Interventions: Respiratory failure - evaluation and management Evaluation Type: New Patient Evaluation  Elmer Sow 09/08/2019, 2:49 AM

## 2019-09-08 NOTE — Progress Notes (Signed)
   09/08/19 1100  Clinical Encounter Type  Visited With Patient and family together  Visit Type Initial  Referral From Chaplain  Consult/Referral To Chaplain  Chaplain briefly spoke with sister. She said she is better now than she was earlier. Pt's sister said she went to pt's home and straightened things up. Pt's sister asked chaplain to pray for her and pt. Chaplain prayed for pt and his sister and told her that she will check in on them later.

## 2019-09-08 NOTE — Progress Notes (Signed)
ANTICOAGULATION CONSULT NOTE  Pharmacy Consult for heparin Indication: chest pain/ACS  No Known Allergies  Patient Measurements: Height: 5\' 8"  (172.7 cm) Weight: 72.5 kg (159 lb 13.3 oz) IBW/kg (Calculated) : 68.4 Heparin Dosing Weight: 77.4 kg  Vital Signs: Temp: 96.6 F (35.9 C) (09/07 2100) Temp Source: Bladder (09/07 2000) BP: 100/64 (09/07 2100) Pulse Rate: 54 (09/07 2100)  Labs: Recent Labs    09/26/2019 2347 09/08/19 0339 09/08/19 1115 09/08/19 1324 09/08/19 2100  HGB 11.2* 11.0*  --   --   --   HCT 36.0* 35.2*  --   --   --   PLT 384 364  --   --   --   APTT 33 83*  --   --   --   LABPROT 14.4 14.4  --   --   --   INR 1.2 1.2  --   --   --   HEPARINUNFRC  --   --   --  0.27* 0.51  CREATININE 2.32* 1.99* 1.92*  --   --   TROPONINIHS 11,411* 12,764* 9,141*  --   --     Estimated Creatinine Clearance: 37.1 mL/min (A) (by C-G formula based on SCr of 1.92 mg/dL (H)).   Medical History: History reviewed. No pertinent past medical history.   Assessment: Patient admitted w/ CPR in progress w/ trops elevated (5 minutes CPR was given). CXR chest shows diffuse bilateral airspace disease. No anticoag PTA. Patient being started on heparin drip for ACS/NSTEMI.  Goal of Therapy:  Heparin level 0.3-0.7 units/ml Monitor platelets by anticoagulation protocol: Yes   Plan:  --9/7 2100 HL 0.51, therapeutic x1. Will continue heparin at current rate.  --Re-check HL 6 hours after rate change --Daily CBC per protocol  Rayna Sexton L 09/08/2019,9:55 PM

## 2019-09-08 NOTE — ED Notes (Signed)
Sister Rodena Goldmann [phone] Ron brother in law: [phone]

## 2019-09-08 NOTE — Consult Note (Addendum)
NEURO HOSPITALIST CONSULT NOTE   Requestig physician: Dr. Mortimer Fries  Reason for Consult: Myoclonus after cardiac arrest  History obtained from:  Chart    HPI:                                                                                                                                          Michael Ramsey is an 64 y.o. male with a PMHx of CKD, gout and HTN who was admitted to the ICU overnight after an out of hospital cardiac arrest in the setting of STEMI.   He was at home alone and had contacted EMS due to respiratory distress.  Upon EMS arrival he was unconscious with agonal breathing and a thready pulse.  He then lost pulses and was found to be in asystole.  He underwent 1 round of CPR with ROSC obtained after approximately 5 minutes.  He was intubated in the field with no paralytics or sedation.  He was noted to become hypotensive and therefore was placed on Dopamine. Upon arrival to the ED he was noted to be posturing, have no cough or gag reflexes, with fixed and dilated pupils, and with a GCS of 3.  He was placed on TTM at 36 C starting at 0245, with rewarming scheduled to start 24 hours later. He was noted to have severe myoclonus, which did not subside on propofol at a rate of 30. He has been loaded with Keppra and is currently on 1000 mg IV BID. Labs reveal impaired renal function but unremarkable LFTs.   PMHx CKD Gout HTN  History reviewed. No pertinent surgical history.  No family history on file.           Social History:  has no history on file for tobacco use, alcohol use, and drug use.  No Known Allergies  Home Medications: No current facility-administered medications on file prior to encounter.   Current Outpatient Medications on File Prior to Encounter  Medication Sig Dispense Refill  . amLODipine (NORVASC) 10 MG tablet Take 10 mg by mouth daily.    . carvedilol (COREG) 12.5 MG tablet Take 12.5 mg by mouth 2 (two) times daily.    . cetirizine  (ZYRTEC) 10 MG tablet Take 10 mg by mouth daily.    . Colchicine 0.6 MG CAPS Take 1 capsule by mouth daily.    . predniSONE (DELTASONE) 10 MG tablet Take 10 mg by mouth See admin instructions. Take 1 dose pack by oral route as directed.       INPATIENT MEDICATIONS:  Scheduled: . chlorhexidine gluconate (MEDLINE KIT)  15 mL Mouth Rinse BID  . Chlorhexidine Gluconate Cloth  6 each Topical Q0600  . insulin aspart  0-15 Units Subcutaneous Q4H  . mouth rinse  15 mL Mouth Rinse 10 times per day   Continuous: . sodium chloride 75 mL/hr at 09/08/19 0759  . sodium chloride 5 mL/hr at 09/08/19 0759  . ceFEPime (MAXIPIME) IV Stopped (09/08/19 0431)  . famotidine (PEPCID) IV Stopped (09/08/19 0155)  . fentaNYL infusion INTRAVENOUS 100 mcg/hr (09/08/19 0759)  . heparin 1,050 Units/hr (09/08/19 0759)  . levETIRAcetam 1,000 mg (09/08/19 0945)  . norepinephrine (LEVOPHED) Adult infusion 6 mcg/min (09/08/19 0759)  . propofol (DIPRIVAN) infusion 60 mcg/kg/min (09/08/19 0943)  . [START ON 10-03-2019] vancomycin       ROS:                                                                                                                                       Unable to obtain due to coma.    Blood pressure (!) 87/73, pulse 67, temperature 97.7 F (36.5 C), temperature source Bladder, resp. rate (!) 21, height _0  (1.727 m), weight 72.5 kg, SpO2 100 %.   General Examination:                                                                                                       Physical Exam  HEENT-  [place]/AT.  Lungs: Intubated Ext: No edema   Neurological Examination Mental Status:  Performed after propofol was held for approximately 1.5 hours. GCS 3.  Cranial Nerves: II: Pupils 3 mm and unreactive bilaterally. No blink to threat.   III,IV, VI: No ptosis. No doll's eye reflex. No  nystagmus. Eyes are midline.   V,VII: No response to tactile stimulation. Face symmetric.  VIII: No response to voice or loud clap.  IX,X: Intubated. Weak cough reflex. No gag reflex.  XI: Head is midline XII: Intubated Motor/Sensory: Upper and lower extremities are flaccid with no movement to noxious stimuli in between myoclonic jerks.  Deep Tendon Reflexes:  Brisk, low amplitude brachioradialis and patellar reflexes. Toes mute bilaterally.  Cerebellar/Gait: Unable to assess Other: Frequent myoclonic jerks of BUE > BLE synchronously, but non-rhythmically. On average, the jerks occur about 3x per 10 second interval. Myoclonus also involves the lower face/jaw/eyelids/forehead bilaterally. The myoclonus is not stimulus induced.     Lab Results: Basic Metabolic Panel: Recent Labs  Lab 09/13/2019 2347 09/08/19 0339  NA 140  139  K 4.4 3.9  CL 105 107  CO2 21* 18*  GLUCOSE 278* 215*  BUN 42* 44*  CREATININE 2.32* 1.99*  CALCIUM 8.2* 8.2*    CBC: Recent Labs  Lab 09/23/2019 2347 09/08/19 0339  WBC 14.1* 13.5*  NEUTROABS 8.8* 11.8*  HGB 11.2* 11.0*  HCT 36.0* 35.2*  MCV 96.0 94.4  PLT 384 364    Cardiac Enzymes: No results for input(s): CKTOTAL, CKMB, CKMBINDEX, TROPONINI in the last 168 hours.  Lipid Panel: No results for input(s): CHOL, TRIG, HDL, CHOLHDL, VLDL, LDLCALC in the last 168 hours.  Imaging: DG Abdomen 1 View  Result Date: 09/08/2019 CLINICAL DATA:  Orogastric tube placement EXAM: ABDOMEN - 1 VIEW COMPARISON:  None. FINDINGS: Tip of the orogastric tube is in the stomach. The side port is just below the gastroesophageal junction. Recommend advancing 5 cm. Large amount of stool in the left colon. IMPRESSION: Orogastric tube side port just below the gastroesophageal junction. Recommend advancing 5 cm. Electronically Signed   By: Ulyses Jarred M.D.   On: 09/08/2019 00:36   CT Head Wo Contrast  Result Date: 09/08/2019 CLINICAL DATA:  Mental status changes EXAM: CT  HEAD WITHOUT CONTRAST TECHNIQUE: Contiguous axial images were obtained from the base of the skull through the vertex without intravenous contrast. COMPARISON:  10/29/2007 FINDINGS: Brain: There is atrophy and chronic small vessel disease changes. No acute intracranial abnormality. Specifically, no hemorrhage, hydrocephalus, mass lesion, acute infarction, or significant intracranial injury. Vascular: No hyperdense vessel or unexpected calcification. Skull: No acute calvarial abnormality. Sinuses/Orbits: Visualized paranasal sinuses and mastoids clear. Orbital soft tissues unremarkable. Other: None IMPRESSION: Atrophy, chronic microvascular disease. No acute intracranial abnormality. Electronically Signed   By: Rolm Baptise M.D.   On: 09/08/2019 00:27   DG Chest Portable 1 View  Result Date: 09/08/2019 CLINICAL DATA:  Intubated EXAM: PORTABLE CHEST 1 VIEW COMPARISON:  05/15/2019 FINDINGS: Endotracheal tube is 1.6 cm above the carina. NG tube is in the stomach. Heart is upper limits normal in size. Diffuse bilateral airspace disease. No effusions. No acute bony abnormality. IMPRESSION: Support devices as above. Diffuse bilateral airspace disease, likely infection. Electronically Signed   By: Rolm Baptise M.D.   On: 09/08/2019 00:36    Assessment: 64 year old male in coma with persistent myoclonus following cardiac arrest.  1. Exam off propofol for approximately 1.5 hours reveals a comatose patient with diffuse myoclonus and several absent brainstem reflexes.  2. Exam findings best localize as diffuse cerebral dysfunction. Most likely etiology is severe diffuse anoxic brain injury.  3. CT head with no acute abnormality  Recommendations: 1. Keppra additional load of 1000 mg IV ordered. Continue at 1000 mg BID. Cannot increase further due to impaired renal function.  2. After propofol bolus (20 x 2) the myoclonus has stopped. Now on propofol gtt at 60 mcg/kg/min 3. Will be rewarmed at 0245  4. Obtain MRI  brain to assist with prognostication 5. EEG has been ordered by CCM 6. Discussed with ICU attending.  7. Prognosis likely to be poor given myoclonus occurring within a short time frame following cardiac arrest  40 minutes spent in the emergent neurological evaluation and management of this critically ill patient.   Addendum: -- EEG shows generalized myoclonic seizures in the context of burst suppression with generalized highly epileptiform bursts. EEG findings of multiple myoclonic seizures as well as profound diffuse encephalopathy are suggestive of diffuse anoxic/hypoxic brain injury. -- Will load with valproic acid 20 mg/kg and continue  at 5 mg IV TID.  -- Discussed poor prognosis with family by telephone earlier today -- Exam on propofol at 80 mcg/kg/min reveals no visible myoclonic jerking.  -- BP is low, therefore will not increase propofol.  -- Will reassess in AM.   20 additional minutes of critical care time  Electronically signed: Dr. Kerney Elbe 09/08/2019, 9:28 AM

## 2019-09-08 NOTE — ED Provider Notes (Signed)
[facility] Emergency Department Provider Note  ____________________________________________  Time seen: Approximately 12:41 AM  I have reviewed the triage vital signs and the nursing notes.   HISTORY  Chief Complaint Cardiac Arrest  Level 5 caveat:  Portions of the history and physical were unable to be obtained due to post-cardiac arrest coma   HPI Michael Ramsey is a 64 y.o. male with history of chronic kidney disease, gouth, and HTN who was brought in via EMS status post cardiac arrest.  Patient was alone at home when he called 911 for respiratory difficulties.  When EMS arrived patient was unconscious with agonal breathing and a thready pulse.  Patient immediately lost pulses and was found to be in asystole.  He underwent 1 round of CPR and 1 epi on the field with ROSC.  Patient was intubated with no paralytics or sedation in the field.  He then became hypotensive and was started on dopamine.  On arrival patient is intubated, posturing, GCS of 3T.   PHM CKD HTN gout  Allergies Patient has no allergy information on record.  No family history on file.  Social History Smoking - never Drugs - no  Review of Systems  Cardiovascular: + cardiac arrest Respiratory: + respiratory distress ____________________________________________   PHYSICAL EXAM:  VITAL SIGNS: ED Triage Vitals  Enc Vitals Group     BP --      Pulse Rate 09/02/2019 2344 (!) 105     Resp 09/18/2019 2344 (!) 24     Temp --      Temp src --      SpO2 09/20/2019 2344 100 %     Weight 09/18/2019 2345 170 lb 10.2 oz (77.4 kg)     Height 09/19/2019 2345 5\' 8"  (1.727 m)     Head Circumference --      Peak Flow --      Pain Score 09/06/2019 2345 Asleep     Pain Loc --      Pain Edu? --      Excl. in [place]? --     Constitutional: GCS 3T, posturing HEENT:      Head: Normocephalic and atraumatic.         Eyes: Pupils are fixed      Mouth/Throat: ETT in place      Neck: Supple    Cardiovascular: Regular rate and rhythm. No murmurs, gallops, or rubs. 2+ symmetrical distal pulses are present in all extremities.  Respiratory: Mechanical breath sounds bilaterally.  Gastrointestinal: Soft and non distended. Musculoskeletal:  No edema, cyanosis, or erythema of extremities. Neurologic: Pupils fixed and dilated, no corneal reflex, no cough or gag reflex, patient posturing Skin: Skin is warm, dry and intact. No rash noted.   ____________________________________________   LABS (all labs ordered are listed, but only abnormal results are displayed)  Labs Reviewed  GLUCOSE, CAPILLARY - Abnormal; Notable for the following components:      Result Value   Glucose-Capillary 259 (*)    All other components within normal limits  BLOOD GAS, ARTERIAL - Abnormal; Notable for the following components:   pH, Arterial 7.13 (*)    pCO2 arterial 54 (*)    pO2, Arterial 431 (*)    Bicarbonate 18.0 (*)    Acid-base deficit 11.1 (*)    All other components within normal limits  CBC WITH DIFFERENTIAL/PLATELET - Abnormal; Notable for the following components:   WBC 14.1 (*)    RBC 3.75 (*)    Hemoglobin 11.2 (*)  HCT 36.0 (*)    Neutro Abs 8.8 (*)    Abs Immature Granulocytes 0.39 (*)    All other components within normal limits  COMPREHENSIVE METABOLIC PANEL - Abnormal; Notable for the following components:   CO2 21 (*)    Glucose, Bld 278 (*)    BUN 42 (*)    Creatinine, Ser 2.32 (*)    Calcium 8.2 (*)    Albumin 3.2 (*)    AST 44 (*)    GFR calc non Af Amer 28 (*)    GFR calc Af Amer 33 (*)    All other components within normal limits  LACTIC ACID, PLASMA - Abnormal; Notable for the following components:   Lactic Acid, Venous 4.3 (*)    All other components within normal limits  TROPONIN I (HIGH SENSITIVITY) - Abnormal; Notable for the following components:   Troponin I (High Sensitivity) 11,411 (*)    All other components within normal limits  SARS CORONAVIRUS 2 BY  RT PCR (HOSPITAL ORDER, [facility])  CULTURE, BLOOD (ROUTINE X 2)  CULTURE, BLOOD (ROUTINE X 2)  CULTURE, RESPIRATORY  PROTIME-INR  APTT  PROCALCITONIN  CBC WITH DIFFERENTIAL/PLATELET  COMPREHENSIVE METABOLIC PANEL  URINALYSIS, COMPLETE (UACMP) WITH MICROSCOPIC  RAPID URINE DRUG SCREEN, HOSP PERFORMED  HEPARIN LEVEL (UNFRACTIONATED)  BASIC METABOLIC PANEL  BASIC METABOLIC PANEL  PROTIME-INR  APTT  BLOOD GAS, ARTERIAL  CBG MONITORING, ED   ____________________________________________  EKG  ED ECG REPORT I, Michael Ramsey, the attending physician, personally viewed and interpreted this ECG.  Sinus tachycardia, rate of 103, normal intervals, ST elevations in V2 to V4 with inversions in 1, aVL, V5 and V6, also inferior ST depressions noted. ____________________________________________  RADIOLOGY  I have personally reviewed the images performed during this visit and I agree with the Radiologist's read.   Interpretation by Radiologist:  DG Abdomen 1 View  Result Date: 09/08/2019 CLINICAL DATA:  Orogastric tube placement EXAM: ABDOMEN - 1 VIEW COMPARISON:  None. FINDINGS: Tip of the orogastric tube is in the stomach. The side port is just below the gastroesophageal junction. Recommend advancing 5 cm. Large amount of stool in the left colon. IMPRESSION: Orogastric tube side port just below the gastroesophageal junction. Recommend advancing 5 cm. Electronically Signed   By: Michael Ramsey M.D.   On: 09/08/2019 00:36   CT Head Wo Contrast  Result Date: 09/08/2019 CLINICAL DATA:  Mental status changes EXAM: CT HEAD WITHOUT CONTRAST TECHNIQUE: Contiguous axial images were obtained from the base of the skull through the vertex without intravenous contrast. COMPARISON:  10/29/2007 FINDINGS: Brain: There is atrophy and chronic small vessel disease changes. No acute intracranial abnormality. Specifically, no hemorrhage, hydrocephalus, mass lesion, acute infarction,  or significant intracranial injury. Vascular: No hyperdense vessel or unexpected calcification. Skull: No acute calvarial abnormality. Sinuses/Orbits: Visualized paranasal sinuses and mastoids clear. Orbital soft tissues unremarkable. Other: None IMPRESSION: Atrophy, chronic microvascular disease. No acute intracranial abnormality. Electronically Signed   By: Michael Ramsey M.D.   On: 09/08/2019 00:27   DG Chest Portable 1 View  Result Date: 09/08/2019 CLINICAL DATA:  Intubated EXAM: PORTABLE CHEST 1 VIEW COMPARISON:  05/15/2019 FINDINGS: Endotracheal tube is 1.6 cm above the carina. NG tube is in the stomach. Heart is upper limits normal in size. Diffuse bilateral airspace disease. No effusions. No acute bony abnormality. IMPRESSION: Support devices as above. Diffuse bilateral airspace disease, likely infection. Electronically Signed   By: Michael Ramsey M.D.   On: 09/08/2019  00:36     ____________________________________________   PROCEDURES  Procedure(s) performed:yes Procedures Critical Care performed: yes  CRITICAL CARE Performed by: Michael Ramsey  ?  Total critical care time: 60 min  Critical care time was exclusive of separately billable procedures and treating other patients.  Critical care was necessary to treat or prevent imminent or life-threatening deterioration.  Critical care was time spent personally by me on the following activities: development of treatment plan with patient and/or surrogate as well as nursing, discussions with consultants, evaluation of patient's response to treatment, examination of patient, obtaining history from patient or surrogate, ordering and performing treatments and interventions, ordering and review of laboratory studies, ordering and review of radiographic studies, pulse oximetry and Ramsey-evaluation of patient's condition.  ____________________________________________   INITIAL IMPRESSION / ASSESSMENT AND PLAN / ED COURSE   64 y.o. male  with history of chronic kidney disease, gouth, and HTN who was brought in via EMS status post cardiac arrest.  Patient arrives with a GCS of 3, intubated, posturing, with no brainstem reflexes.  No paralytics or sedation was used in the field for intubation.  Airway position was confirmed with glide scope upon arrival.  EKG concerning for STEMI.  Discussed with Dr. Fletcher Anon who is on-call for STEMI.  Given the poor neurological exam Dr. Fletcher Anon recommended medical management at this time with cooling and IV heparin and reassessment in the morning for possible catheterization.  Head CT with no acute findings.  Cooling protocol initiated.  Patient started on heparin.  I have attempted to contact patient's family unsuccessfully.  Old medical records reviewed.  Discussed with the ICU for admission.   _________________________ 1:10 AM on 09/08/2019 -----------------------------------------  I was able to speak with patient's sister Ms. Ardine Eng who lives in [place].  She is on her way to the hospital.  At this time she wishes the patient remains full code until she arrives to the hospital.  Discussed this with the ICU nurse practitioner.     _____________________________________________ Please note:  Patient was evaluated in Emergency Department today for the symptoms described in the history of present illness. Patient was evaluated in the context of the global COVID-19 pandemic, which necessitated consideration that the patient might be at risk for infection with the SARS-CoV-2 virus that causes COVID-19. Institutional protocols and algorithms that pertain to the evaluation of patients at risk for COVID-19 are in a state of rapid change based on information released by regulatory bodies including the CDC and federal and state organizations. These policies and algorithms were followed during the patient's care in the ED.  Some ED evaluations and interventions may be delayed as a result of  limited staffing during the pandemic.   [place] Controlled Substance Database was reviewed by me. ____________________________________________   FINAL CLINICAL IMPRESSION(S) / ED DIAGNOSES   Final diagnoses:  Cardiac arrest ([place])  ST elevation myocardial infarction (STEMI), unspecified artery ([place])      NEW MEDICATIONS STARTED DURING THIS VISIT:  ED Discharge Orders    None       Note:  This document was prepared using Dragon voice recognition software and may include unintentional dictation errors.    Alfred Levins, [place], MD 09/08/19 [phone]

## 2019-09-08 NOTE — Consult Note (Signed)
Cardiology Consultation:   Patient ID: Michael Ramsey MRN: [id number]; DOB: [birth year]  Admit date: 09/21/2019 Date of Consult: 09/08/2019  Primary Care Provider: Marguerita Merles, MD [facility] Cardiologist: [place] Electrophysiologist:  None   Patient Profile:   Michael Ramsey is a 64 y.o. male with a hx of history of hypertension, chronic kidney disease, and gout, who is being seen today for the evaluation of cardiac arrest at the request of Dr Mortimer Fries.  History of Present Illness:   Michael Ramsey is currently intubated, sedated, and unresponsive.  History is obtained from the chart.  He reportedly called 911 yesterday due to respiratory distress but became unresponsive while on the phone.  When EMS arrived, he was unconscious with agonal breathing and a thready pulse.  He then lost a pulse and was found to be in asystole.  He underwent CPR with return of spontaneous circulation after approximately 5 minutes.  Due to hypotension, he was placed on dopamine.  EKG following ROSC showed possible anterior ST elevation.  However, catheterization was deferred due to posturing and myoclonus concerning for severe anoxic brain injury.  Targeted temperature management was initiated.  Patient was evaluated by neurology this morning, with exam findings suggestive of severe anoxic brain injury.   Past medical history: Hypertension Chronic kidney disease Gout  History reviewed. No pertinent surgical history.   Home Medications:  Prior to Admission medications   Medication Sig Start Date Aasim Restivo Date Taking? Authorizing Provider  amLODipine (NORVASC) 10 MG tablet Take 10 mg by mouth daily. 06/26/19  Yes [provider]  carvedilol (COREG) 12.5 MG tablet Take 12.5 mg by mouth 2 (two) times daily.   Yes [provider]  cetirizine (ZYRTEC) 10 MG tablet Take 10 mg by mouth daily. 06/26/19  Yes [provider]  Colchicine 0.6 MG CAPS Take 1 capsule by mouth daily. 06/04/19  Yes  [provider]  predniSONE (DELTASONE) 10 MG tablet Take 10 mg by mouth See admin instructions. Take 1 dose pack by oral route as directed.   Yes [provider]    Inpatient Medications: Scheduled Meds: . chlorhexidine gluconate (MEDLINE KIT)  15 mL Mouth Rinse BID  . Chlorhexidine Gluconate Cloth  6 each Topical Q0600  . insulin aspart  0-15 Units Subcutaneous Q4H  . mouth rinse  15 mL Mouth Rinse 10 times per day   Continuous Infusions: . sodium chloride 75 mL/hr at 09/08/19 1200  . sodium chloride 5 mL/hr at 09/08/19 1200  . ceFEPime (MAXIPIME) IV Stopped (09/08/19 1117)  . famotidine (PEPCID) IV Stopped (09/08/19 0155)  . fentaNYL infusion INTRAVENOUS 100 mcg/hr (09/08/19 1200)  . heparin 1,050 Units/hr (09/08/19 1200)  . levETIRAcetam Stopped (09/08/19 1000)  . norepinephrine (LEVOPHED) Adult infusion 6 mcg/min (09/08/19 1200)  . propofol (DIPRIVAN) infusion 80 mcg/kg/min (09/08/19 1240)   PRN Meds: sodium chloride, fentaNYL, midazolam  Allergies:   No Known Allergies  Social History:   Unable to obtain due to critical illness.  Family History:   Unable to obtain due to critical illness.  ROS:  Unable to obtain due to critical illness.     Physical Exam/Data:   Vitals:   09/08/19 1045 09/08/19 1100 09/08/19 1200 09/08/19 1300  BP:  (!) 87/66 (!) 89/58   Pulse:  (!) 55 60   Resp:  20 20   Temp: (!) 96.1 F (35.6 C) (!) 95.7 F (35.4 C) (!) 95.5 F (35.3 C) (!) 96.6 F (35.9 C)  TempSrc: Bladder Bladder Bladder  Bladder  SpO2:  100% 100%   Weight:      Height:        Intake/Output Summary (Last 24 hours) at 09/08/2019 1344 Last data filed at 09/08/2019 1200 Gross per 24 hour  Intake 2116.05 ml  Output 630 ml  Net 1486.05 ml   Last 3 Weights 09/08/2019 09/27/2019  Weight (lbs) 159 lb 13.3 oz 170 lb 10.2 oz  Weight (kg) 72.5 kg 77.4 kg     Body mass index is 24.3 kg/m.  General: Intubated and sedated.  Patient is unresponsive. HEENT:  Endotracheal tube in place. Lymph: no adenopathy Neck: Unable to assess JVP due to support devices. Endocrine:  No thryomegaly Vascular: No carotid bruits; 2+ radial pulses bilaterally. Cardiac: Regular rate and rhythm without murmurs, rubs, or gallops. Lungs: Clear breath sounds anteriorly without wheezes or crackles. Abd: soft, nontender, no hepatomegaly  Ext: no edema Musculoskeletal:  No deformities. Skin: warm and dry  Neuro: Intubated and sedated. Psych: Unable to assess as the patient is intubated and sedated.  EKG:  The EKG was personally reviewed and demonstrates: Normal sinus rhythm with anterior ST elevation and lateral T wave inversions Telemetry:  Telemetry was personally reviewed and demonstrates: Sinus rhythm with artifact  Relevant CV Studies: None available.  Echocardiogram currently pending.  Laboratory Data:  High Sensitivity Troponin:   Recent Labs  Lab 10/01/2019 2347 09/08/19 0339 09/08/19 1115  TROPONINIHS 11,411* 12,764* 9,141*     Chemistry Recent Labs  Lab 10/01/2019 2347 09/08/19 0339 09/08/19 1115  NA 140 139 138  K 4.4 3.9 3.8  CL 105 107 109  CO2 21* 18* 16*  GLUCOSE 278* 215* 130*  BUN 42* 44* 40*  CREATININE 2.32* 1.99* 1.92*  CALCIUM 8.2* 8.2* 7.7*  GFRNONAA 28* 34* 36*  GFRAA 33* 40* 41*  ANIONGAP _0 Recent Labs  Lab 09/15/2019 2347 09/08/19 0339  PROT 7.1 6.9  ALBUMIN 3.2* 3.4*  AST 44* 40  ALT 35 35  ALKPHOS 100 87  BILITOT 0.5 0.8   Hematology Recent Labs  Lab 09/25/2019 2347 09/08/19 0339  WBC 14.1* 13.5*  RBC 3.75* 3.73*  HGB 11.2* 11.0*  HCT 36.0* 35.2*  MCV 96.0 94.4  MCH 29.9 29.5  MCHC 31.1 31.3  RDW 14.5 14.4  PLT 384 364   BNP Recent Labs  Lab 09/02/2019 2347  BNP 1,073.8*    DDimer No results for input(s): DDIMER in the last 168 hours.   Radiology/Studies:  DG Abdomen 1 View  Result Date: 09/08/2019 CLINICAL DATA:  Orogastric tube placement EXAM: ABDOMEN - 1 VIEW COMPARISON:  None.  FINDINGS: Tip of the orogastric tube is in the stomach. The side port is just below the gastroesophageal junction. Recommend advancing 5 cm. Large amount of stool in the left colon. IMPRESSION: Orogastric tube side port just below the gastroesophageal junction. Recommend advancing 5 cm. Electronically Signed   By: Ulyses Jarred M.D.   On: 09/08/2019 00:36   CT Head Wo Contrast  Result Date: 09/08/2019 CLINICAL DATA:  Mental status changes EXAM: CT HEAD WITHOUT CONTRAST TECHNIQUE: Contiguous axial images were obtained from the base of the skull through the vertex without intravenous contrast. COMPARISON:  10/29/2007 FINDINGS: Brain: There is atrophy and chronic small vessel disease changes. No acute intracranial abnormality. Specifically, no hemorrhage, hydrocephalus, mass lesion, acute infarction, or significant intracranial injury. Vascular: No hyperdense vessel or unexpected calcification. Skull: No acute calvarial abnormality. Sinuses/Orbits: Visualized paranasal sinuses and mastoids clear. Orbital soft  tissues unremarkable. Other: None IMPRESSION: Atrophy, chronic microvascular disease. No acute intracranial abnormality. Electronically Signed   By: Rolm Baptise M.D.   On: 09/08/2019 00:27   DG Chest Portable 1 View  Result Date: 09/08/2019 CLINICAL DATA:  Intubated EXAM: PORTABLE CHEST 1 VIEW COMPARISON:  05/15/2019 FINDINGS: Endotracheal tube is 1.6 cm above the carina. NG tube is in the stomach. Heart is upper limits normal in size. Diffuse bilateral airspace disease. No effusions. No acute bony abnormality. IMPRESSION: Support devices as above. Diffuse bilateral airspace disease, likely infection. Electronically Signed   By: Rolm Baptise M.D.   On: 09/08/2019 00:36   Assessment and Plan:   Anterior STEMI: Patient initially summoned EMS due to respiratory distress and was found unresponsive.  Was then noted to be in asystole and had ROSC after 1 round of CPR.  Presenting EKG showed anterior ST  elevation but catheterization was deferred due to concern for severe anoxic brain injury following cardiac arrest.  High-sensitivity troponin I peaked overnight at 12,764.  Agree with deferring catheterization given high likelihood for devastating neurologic injury and poor prognosis.  Catheterization could be pursued if meaningful neurologic recovery were to occur.  Follow-up echocardiogram.  Continue IV heparin for at least 48 hours.  Initiate aspirin 81 mg daily.  Initiate atorvastatin 40 mg daily.  Shock: Most likely cardiogenic in nature though cannot exclude other underlying etiology.  New vasopressor support per CCM.  Follow-up echocardiogram.  Patient not a candidate for mechanical support given high likelihood of brain injury.  Anoxic brain injury:  Ongoing management per CCM and neurology.  Agree with targeted temperature management.  Acute kidney injury superimposed on chronic kidney disease:  Avoid nephrotoxic drugs.  For questions or updates, please contact [place] Please consult [email or web address] for contact info under [facility].  Signed, Nelva Bush, MD  09/08/2019 1:44 PM

## 2019-09-08 NOTE — Procedures (Signed)
Arterial Catheter Insertion Procedure Note  Michael Ramsey  [id number]  1955/11/03  Date:09/08/19  Time:7:35 AM    Provider Performing: Bradly Bienenstock    Procedure: Insertion of Arterial Line [phone]) with US guidance ([id number])   Indication(s) Blood pressure monitoring and/or need for frequent ABGs  Consent Unable to obtain consent due to emergent nature of procedure.  Anesthesia None   Time Out Verified patient identification, verified procedure, site/side was marked, verified correct patient position, special equipment/implants available, medications/allergies/relevant history reviewed, required imaging and test results available.   Sterile Technique Maximal sterile technique including full sterile barrier drape, hand hygiene, sterile gown, sterile gloves, mask, hair covering, sterile ultrasound probe cover (if used).   Procedure Description Area of catheter insertion was cleaned with chlorhexidine and draped in sterile fashion. With real-time ultrasound guidance an arterial catheter was placed into the left femoral artery.  Appropriate arterial tracings confirmed on monitor.     Complications/Tolerance None; patient tolerated the procedure well.   EBL Minimal   Specimen(s) None   BIOPATCH applied to the insertion site.    Darel Hong, AGACNP-BC [place] [facility] Pager: [phone]

## 2019-09-08 NOTE — Progress Notes (Signed)
Initial Nutrition Assessment  DOCUMENTATION CODES:   Not applicable  INTERVENTION:  Current plan is to hold off on initiation of tube feeds.  If eventually plan is for initiation of tube feeds recommend: -Vital High Protein at 20 mL/hr (480 mL goal daily volume) per tube -PROSource TF 90 mL TID per tube -Goal regimen provides 720 kcal, 108 grams of protein, 403 mL H2O daily. With current propofol rate provides 1702 kcal daily.  NUTRITION DIAGNOSIS:   Inadequate oral intake related to inability to eat as evidenced by NPO status.  GOAL:   Patient will meet greater than or equal to 90% of their needs  MONITOR:   Vent status, Labs, Weight trends, I & O's  REASON FOR ASSESSMENT:   Ventilator    ASSESSMENT:   64 year old male admitted s/p cardiac arrest in setting of STEMI, intubated, placed on 36C TTM protocol, noted to have severe myoclonus.   9/6 intubated 9/7 cooling target temperature (36C) achieved at 0245  Patient is currently intubated on ventilator support MV: 9.5 L/min Temp (24hrs), Avg:96.7 F (35.9 C), Min:95.5 F (35.3 C), Max:98.2 F (36.8 C)  Propofol: 37.2 ml/hr (982 kcal daily)  Medications reviewed and include: Novolog 0-15 units Q4hrs, NS at 75 mL/hr, cefepime, famotidine, fentanyl gtt, heparin gtt, Keppra, norepinephrine gtt at 6 mcg/min, propofol gtt.  Labs reviewed: CBG 89-117, CO2 16, BUN 40, Creatinine 1.92.  I/O: 455 mL UOP so far today  Enteral Access: OGT placed 9/7; per abdominal x-ray 9/7 tip is in stomach and side port is just below GE junction and 5 cm advancement is recommended; discussed with RN  Discussed with RN and on rounds. Plan is to hold off on initiation of tube feeds for now. Propofol being used to help with myoclonus.  NUTRITION - FOCUSED PHYSICAL EXAM:  Deferred. Per discussion with RN stimulation worsens myoclonus.  Diet Order:   Diet Order    None     EDUCATION NEEDS:   No education needs have been identified  at this time  Skin:  Skin Assessment: Reviewed RN Assessment  Last BM:  Unknown  Height:   Ht Readings from Last 1 Encounters:  09/08/19 5\' 8"  (1.727 m)   Weight:   Wt Readings from Last 1 Encounters:  09/08/19 72.5 kg   BMI:  Body mass index is 24.3 kg/m.  Estimated Nutritional Needs:   Kcal:  1691  Protein:  95-110 grams  Fluid:  1.8-2 L/day  Jacklynn Barnacle, MS, RD, LDN Pager number available on Amion

## 2019-09-08 NOTE — Progress Notes (Signed)
Pharmacy Antibiotic Note  Isaac Dubie is a 64 y.o. male admitted on 09/08/2019 with cardiac arrest s/p CPR x 5 minutes s/p ROSC w/ CXR showing diffuse bilateral airspace disease.  Pharmacy has been consulted for vanc/cefepime dosing.  Plan: Patient received vanc 2g IV load and cefepime 2g IV x 1.  Will continue vanc 1g IV q24h and cefepime 2g IV q12h per CrCl 30 - 60 ml/min. Will continue to monitor renal function and adjust doses per changes in renal function.  Goal trough 15 - 20 mcg/mL. Ke [id number] T1/2 ~ 24 hrs  Height: 5\' 8"  (172.7 cm) Weight: 72.5 kg (159 lb 13.3 oz) IBW/kg (Calculated) : 68.4  Temp (24hrs), Avg:97 F (36.1 C), Min:95.5 F (35.3 C), Max:98.2 F (36.8 C)  Recent Labs  Lab 09/11/2019 2347 09/08/19 0339  WBC 14.1* 13.5*  CREATININE 2.32*  --   LATICACIDVEN 4.3*  --     Estimated Creatinine Clearance: 30.7 mL/min (A) (by C-G formula based on SCr of 2.32 mg/dL (H)).    No Known Allergies   Thank you for allowing pharmacy to be a part of this patient's care.  Tobie Lords, PharmD, BCPS Clinical Pharmacist 09/08/2019 4:43 AM

## 2019-09-09 DIAGNOSIS — I213 ST elevation (STEMI) myocardial infarction of unspecified site: Secondary | ICD-10-CM

## 2019-09-09 DIAGNOSIS — I469 Cardiac arrest, cause unspecified: Secondary | ICD-10-CM

## 2019-09-09 DIAGNOSIS — G939 Disorder of brain, unspecified: Secondary | ICD-10-CM

## 2019-09-09 LAB — CBC WITH DIFFERENTIAL/PLATELET
Abs Immature Granulocytes: 0.08 10*3/uL — ABNORMAL HIGH (ref 0.00–0.07)
Basophils Absolute: 0 10*3/uL (ref 0.0–0.1)
Basophils Relative: 0 %
Eosinophils Absolute: 0.1 10*3/uL (ref 0.0–0.5)
Eosinophils Relative: 1 %
HCT: 30.3 % — ABNORMAL LOW (ref 39.0–52.0)
Hemoglobin: 9.7 g/dL — ABNORMAL LOW (ref 13.0–17.0)
Immature Granulocytes: 1 %
Lymphocytes Relative: 23 %
Lymphs Abs: 3 10*3/uL (ref 0.7–4.0)
MCH: 30.1 pg (ref 26.0–34.0)
MCHC: 32 g/dL (ref 30.0–36.0)
MCV: 94.1 fL (ref 80.0–100.0)
Monocytes Absolute: 0.7 10*3/uL (ref 0.1–1.0)
Monocytes Relative: 5 %
Neutro Abs: 9.1 10*3/uL — ABNORMAL HIGH (ref 1.7–7.7)
Neutrophils Relative %: 70 %
Platelets: 337 10*3/uL (ref 150–400)
RBC: 3.22 MIL/uL — ABNORMAL LOW (ref 4.22–5.81)
RDW: 14.6 % (ref 11.5–15.5)
WBC: 12.9 10*3/uL — ABNORMAL HIGH (ref 4.0–10.5)
nRBC: 0 % (ref 0.0–0.2)

## 2019-09-09 LAB — COMPREHENSIVE METABOLIC PANEL
ALT: 26 U/L (ref 0–44)
AST: 28 U/L (ref 15–41)
Albumin: 2.6 g/dL — ABNORMAL LOW (ref 3.5–5.0)
Alkaline Phosphatase: 65 U/L (ref 38–126)
Anion gap: 8 (ref 5–15)
BUN: 32 mg/dL — ABNORMAL HIGH (ref 8–23)
CO2: 18 mmol/L — ABNORMAL LOW (ref 22–32)
Calcium: 7.9 mg/dL — ABNORMAL LOW (ref 8.9–10.3)
Chloride: 114 mmol/L — ABNORMAL HIGH (ref 98–111)
Creatinine, Ser: 1.69 mg/dL — ABNORMAL HIGH (ref 0.61–1.24)
GFR calc Af Amer: 48 mL/min — ABNORMAL LOW (ref >60)
GFR calc non Af Amer: 42 mL/min — ABNORMAL LOW (ref >60)
Glucose, Bld: 136 mg/dL — ABNORMAL HIGH (ref 70–99)
Potassium: 3.4 mmol/L — ABNORMAL LOW (ref 3.5–5.1)
Sodium: 140 mmol/L (ref 135–145)
Total Bilirubin: 0.7 mg/dL (ref 0.3–1.2)
Total Protein: 5.6 g/dL — ABNORMAL LOW (ref 6.5–8.1)

## 2019-09-09 LAB — PHOSPHORUS: Phosphorus: 2.7 mg/dL (ref 2.5–4.6)

## 2019-09-09 LAB — GLUCOSE, CAPILLARY
Glucose-Capillary: 127 mg/dL — ABNORMAL HIGH (ref 70–99)
Glucose-Capillary: 95 mg/dL (ref 70–99)

## 2019-09-09 LAB — HEPARIN LEVEL (UNFRACTIONATED): Heparin Unfractionated: 0.58 IU/mL (ref 0.30–0.70)

## 2019-09-09 LAB — PROCALCITONIN: Procalcitonin: 10.75 ng/mL

## 2019-09-09 LAB — LEGIONELLA PNEUMOPHILA SEROGP 1 UR AG: L. pneumophila Serogp 1 Ur Ag: NEGATIVE

## 2019-09-09 LAB — TRIGLYCERIDES: Triglycerides: 400 mg/dL — ABNORMAL HIGH (ref <150)

## 2019-09-09 LAB — MAGNESIUM: Magnesium: 2.1 mg/dL (ref 1.7–2.4)

## 2019-09-09 MED ORDER — LORAZEPAM 2 MG/ML IJ SOLN
4.0000 mg | INTRAMUSCULAR | Status: DC | PRN
  Administered 2019-09-09 (×2): 4 mg via INTRAVENOUS
  Filled 2019-09-09: qty 4

## 2019-09-09 MED ORDER — MORPHINE 100MG IN NS 100ML (1MG/ML) PREMIX INFUSION
0.0000 mg/h | INTRAVENOUS | Status: DC
  Administered 2019-09-09: 5 mg/h via INTRAVENOUS
  Filled 2019-09-09: qty 100

## 2019-09-09 MED ORDER — DEXTROSE 5 % IV SOLN: INTRAVENOUS | Status: DC

## 2019-09-09 MED ORDER — ACETAMINOPHEN 325 MG PO TABS: 650.0000 mg | ORAL_TABLET | Freq: Four times a day (QID) | ORAL | Status: DC | PRN

## 2019-09-09 MED ORDER — GLYCOPYRROLATE 1 MG PO TABS
1.0000 mg | ORAL_TABLET | ORAL | Status: DC | PRN
  Filled 2019-09-09: qty 1

## 2019-09-09 MED ORDER — MORPHINE BOLUS VIA INFUSION
5.0000 mg | INTRAVENOUS | Status: DC | PRN
  Filled 2019-09-09: qty 5

## 2019-09-09 MED ORDER — GLYCOPYRROLATE 0.2 MG/ML IJ SOLN: 0.2000 mg | INTRAMUSCULAR | Status: DC | PRN

## 2019-09-09 MED ORDER — MORPHINE SULFATE (PF) 2 MG/ML IV SOLN: 2.0000 mg | INTRAVENOUS | Status: DC | PRN

## 2019-09-09 MED ORDER — ACETAMINOPHEN 650 MG RE SUPP: 650.0000 mg | Freq: Four times a day (QID) | RECTAL | Status: DC | PRN

## 2019-09-09 MED ORDER — POLYVINYL ALCOHOL 1.4 % OP SOLN
1.0000 [drp] | Freq: Four times a day (QID) | OPHTHALMIC | Status: DC | PRN
  Filled 2019-09-09: qty 15

## 2019-09-09 MED ORDER — DIPHENHYDRAMINE HCL 50 MG/ML IJ SOLN
25.0000 mg | INTRAMUSCULAR | Status: DC | PRN
  Administered 2019-09-09: 25 mg via INTRAVENOUS
  Filled 2019-09-09: qty 1

## 2019-09-10 LAB — URINE CULTURE: Culture: 70000 — AB

## 2019-09-13 LAB — CULTURE, BLOOD (ROUTINE X 2)
Culture: NO GROWTH
Culture: NO GROWTH
Special Requests: ADEQUATE
Special Requests: ADEQUATE

## 2019-09-18 ENCOUNTER — Encounter: Payer: Self-pay | Admitting: Internal Medicine

## 2019-10-02 NOTE — Consult Note (Signed)
[address] [place], [place] [address] Phone [phone]. Fax [phone]  Date: 09/30/2019                  Patient Name:  Michael Ramsey  MRN: [id number]  DOB: 10-29-1955  Age / Sex: 64 y.o., male         PCP: Marguerita Merles, MD                 Service Requesting Consult: IM/ Flora Lipps, MD                 Reason for Consult: ARF            History of Present Illness: Patient is a 63 y.o. male admitted to [facility] on 09/20/2019 for evaluation of Cardiac arrest ([place]) [I46.9] ST elevation myocardial infarction (STEMI), unspecified artery ([place]) [I21.3]   Patient presented to the emergency room for cardiac arrest.  He was found unconscious with agonal breathing by EMS.  He underwent CPR and required administration of epinephrine.  He was intubated in the field.  Nephrology consult has now been requested to evaluate for acute kidney injury.  Presenting creatinine of 2.32   Remains critically ill  Neuro-currently on propofol and fentanyl pulm- vent assisted, fio2 28% cvs- requiring pressors- nor epi Heparin drip Renal-  09/07 0701 - 09/08 0700 In: 4701.5 [I.V.:4038.9; IV Piggyback:662.5] Out: 2354 [Urine:2004; Emesis/NG output:350]    Vital Signs: Blood pressure 107/66, pulse 63, temperature 98.6 F (37 C), resp. rate 20, height 5\' 8"  (1.727 m), weight 72.5 kg, SpO2 99 %.   Intake/Output Summary (Last 24 hours) at September 30, 2019 0926 Last data filed at 30-Sep-2019 0900 Gross per 24 hour  Intake 4768.43 ml  Output 2394 ml  Net 2374.43 ml    Weight trends: Filed Weights   10/01/2019 2345 09/08/19 0245  Weight: 77.4 kg 72.5 kg    Physical Exam: General:  Critically ill-appearing  HEENT  ET tube, OG tube to suction  Lungs:  Ventilator assisted  Heart::  irregular, tachycardic  Abdomen:  Soft, nondistended  Extremities:  Trace edema  Neurologic:  Sedated  Skin:  No acute rashes  Foley:  Foley in place       Lab results: Basic Metabolic Panel: Recent Labs   Lab 09/08/19 0339 09/08/19 1115 09-30-2019 0418  NA 139 138 140  K 3.9 3.8 3.4*  CL 107 109 114*  CO2 18* 16* 18*  GLUCOSE 215* 130* 136*  BUN 44* 40* 32*  CREATININE 1.99* 1.92* 1.69*  CALCIUM 8.2* 7.7* 7.9*  MG  --   --  2.1  PHOS  --   --  2.7    Liver Function Tests: Recent Labs  Lab 09-30-19 0418  AST 28  ALT 26  ALKPHOS 65  BILITOT 0.7  PROT 5.6*  ALBUMIN 2.6*   No results for input(s): LIPASE, AMYLASE in the last 168 hours. No results for input(s): AMMONIA in the last 168 hours.  CBC: Recent Labs  Lab 09/08/19 0339 2019-09-30 0418  WBC 13.5* 12.9*  NEUTROABS 11.8* 9.1*  HGB 11.0* 9.7*  HCT 35.2* 30.3*  MCV 94.4 94.1  PLT 364 337    Cardiac Enzymes: No results for input(s): CKTOTAL, TROPONINI in the last 168 hours.  BNP: Invalid input(s): POCBNP  CBG: Recent Labs  Lab 09/08/19 1613 09/08/19 1932 09/08/19 2354 09-30-2019 0409 09-30-2019 0739  GLUCAP 106* 104* 121* 127* 95    Microbiology: Recent Results (from the past 720 hour(s))  SARS  Coronavirus 2 by RT PCR (hospital order, performed in [facility] hospital lab) Nasopharyngeal Nasopharyngeal Swab     Status: None   Collection Time: 09/08/2019 11:47 PM   Specimen: Nasopharyngeal Swab  Result Value Ref Range Status   SARS Coronavirus 2 NEGATIVE NEGATIVE Final    Comment: (NOTE) SARS-CoV-2 target nucleic acids are NOT DETECTED.  The SARS-CoV-2 RNA is generally detectable in upper and lower respiratory specimens during the acute phase of infection. The lowest concentration of SARS-CoV-2 viral copies this assay can detect is 250 copies / mL. A negative result does not preclude SARS-CoV-2 infection and should not be used as the sole basis for treatment or other patient management decisions.  A negative result may occur with improper specimen collection / handling, submission of specimen other than nasopharyngeal swab, presence of viral mutation(s) within the areas targeted by this assay, and  inadequate number of viral copies (<250 copies / mL). A negative result must be combined with clinical observations, patient history, and epidemiological information.  Fact Sheet for Patients:   StrictlyIdeas.no  Fact Sheet for Healthcare Providers: BankingDealers.co.za  This test is not yet approved or  cleared by the [place] FDA and has been authorized for detection and/or diagnosis of SARS-CoV-2 by FDA under an Emergency Use Authorization (EUA).  This EUA will remain in effect (meaning this test can be used) for the duration of the COVID-19 declaration under Section 564(b)(1) of the Act, 21 U.S.C. section 360bbb-3(b)(1), unless the authorization is terminated or revoked sooner.  Performed at [facility], [address] [place], [place] [address]   CULTURE, BLOOD (ROUTINE X 2) w Reflex to ID Panel     Status: None (Preliminary result)   Collection Time: 09/08/19  1:14 AM   Specimen: BLOOD  Result Value Ref Range Status   Specimen Description BLOOD LT FOREARM  Final   Special Requests   Final    BOTTLES DRAWN AEROBIC AND ANAEROBIC Blood Culture adequate volume   Culture   Final    NO GROWTH < 12 HOURS Performed at [facility], [address] [place], [place] [address]    Report Status PENDING  Incomplete  CULTURE, BLOOD (ROUTINE X 2) w Reflex to ID Panel     Status: None (Preliminary result)   Collection Time: 09/08/19  1:14 AM   Specimen: BLOOD  Result Value Ref Range Status   Specimen Description BLOOD RAC  Final   Special Requests   Final    BOTTLES DRAWN AEROBIC AND ANAEROBIC Blood Culture adequate volume   Culture   Final    NO GROWTH < 12 HOURS Performed at [facility], [address] [place], [place] [address]    Report Status PENDING  Incomplete  MRSA PCR Screening     Status: None   Collection Time: 09/08/19  2:44 AM   Specimen: Nasopharyngeal  Result Value Ref Range  Status   MRSA by PCR NEGATIVE NEGATIVE Final    Comment:        The GeneXpert MRSA Assay (FDA approved for NASAL specimens only), is one component of a comprehensive MRSA colonization surveillance program. It is not intended to diagnose MRSA infection nor to guide or monitor treatment for MRSA infections. Performed at [facility], [address] [place], [place] [address]   Respiratory Panel by PCR     Status: None   Collection Time: 09/08/19  4:00 AM   Specimen: Nasopharyngeal Swab; Respiratory  Result Value Ref Range Status  Adenovirus NOT DETECTED NOT DETECTED Final   Coronavirus 229E NOT DETECTED NOT DETECTED Final    Comment: (NOTE) The Coronavirus on the Respiratory Panel, DOES NOT test for the novel  Coronavirus (2019 nCoV)    Coronavirus HKU1 NOT DETECTED NOT DETECTED Final   Coronavirus NL63 NOT DETECTED NOT DETECTED Final   Coronavirus OC43 NOT DETECTED NOT DETECTED Final   Metapneumovirus NOT DETECTED NOT DETECTED Final   Rhinovirus / Enterovirus NOT DETECTED NOT DETECTED Final   Influenza A NOT DETECTED NOT DETECTED Final   Influenza B NOT DETECTED NOT DETECTED Final   Parainfluenza Virus 1 NOT DETECTED NOT DETECTED Final   Parainfluenza Virus 2 NOT DETECTED NOT DETECTED Final   Parainfluenza Virus 3 NOT DETECTED NOT DETECTED Final   Parainfluenza Virus 4 NOT DETECTED NOT DETECTED Final   Respiratory Syncytial Virus NOT DETECTED NOT DETECTED Final   Bordetella pertussis NOT DETECTED NOT DETECTED Final   Chlamydophila pneumoniae NOT DETECTED NOT DETECTED Final   Mycoplasma pneumoniae NOT DETECTED NOT DETECTED Final    Comment: Performed at [facility], [place] [address] [place], [place] [address]     Coagulation Studies: Recent Labs    09/06/2019 2347 09/08/19 0339  LABPROT 14.4 14.4  INR 1.2 1.2    Urinalysis: Recent Labs    09/25/2019 2347  COLORURINE YELLOW*  LABSPEC 1.018  PHURINE 5.0  GLUCOSEU 50*  HGBUR NEGATIVE  BILIRUBINUR  NEGATIVE  KETONESUR NEGATIVE  PROTEINUR >=300*  NITRITE NEGATIVE  LEUKOCYTESUR NEGATIVE        Imaging: DG Abdomen 1 View  Result Date: 09/08/2019 CLINICAL DATA:  Orogastric tube placement EXAM: ABDOMEN - 1 VIEW COMPARISON:  None. FINDINGS: Tip of the orogastric tube is in the stomach. The side port is just below the gastroesophageal junction. Recommend advancing 5 cm. Large amount of stool in the left colon. IMPRESSION: Orogastric tube side port just below the gastroesophageal junction. Recommend advancing 5 cm. Electronically Signed   By: Ulyses Jarred M.D.   On: 09/08/2019 00:36   CT Head Wo Contrast  Result Date: 09/08/2019 CLINICAL DATA:  Mental status changes EXAM: CT HEAD WITHOUT CONTRAST TECHNIQUE: Contiguous axial images were obtained from the base of the skull through the vertex without intravenous contrast. COMPARISON:  10/29/2007 FINDINGS: Brain: There is atrophy and chronic small vessel disease changes. No acute intracranial abnormality. Specifically, no hemorrhage, hydrocephalus, mass lesion, acute infarction, or significant intracranial injury. Vascular: No hyperdense vessel or unexpected calcification. Skull: No acute calvarial abnormality. Sinuses/Orbits: Visualized paranasal sinuses and mastoids clear. Orbital soft tissues unremarkable. Other: None IMPRESSION: Atrophy, chronic microvascular disease. No acute intracranial abnormality. Electronically Signed   By: Rolm Baptise M.D.   On: 09/08/2019 00:27   DG Chest Portable 1 View  Result Date: 09/08/2019 CLINICAL DATA:  Intubated EXAM: PORTABLE CHEST 1 VIEW COMPARISON:  05/15/2019 FINDINGS: Endotracheal tube is 1.6 cm above the carina. NG tube is in the stomach. Heart is upper limits normal in size. Diffuse bilateral airspace disease. No effusions. No acute bony abnormality. IMPRESSION: Support devices as above. Diffuse bilateral airspace disease, likely infection. Electronically Signed   By: Rolm Baptise M.D.   On: 09/08/2019 00:36    EEG adult  Result Date: 09/08/2019 Lora Havens, MD     09/08/2019  4:02 PM Patient Name: Michael Ramsey MRN: [id number] Epilepsy Attending: Lora Havens Referring Physician/Provider: Darel Hong, NP Date: 09/08/2019 Duration: 24.46 minutes Patient history: 64 year old male status post cardiac arrest and noted to have seizure-like  episodes.  EEG to evaluate for seizures. Level of alertness: Comatose AEDs during EEG study: LEV, Propofol, Versed Technical aspects: This EEG study was done with scalp electrodes positioned according to the 10-20 International system of electrode placement. Electrical activity was acquired at a sampling rate of 500Hz  and reviewed with a high frequency filter of 70Hz  and a low frequency filter of 1Hz . EEG data were recorded continuously and digitally stored. Description: Patient was noted to have multiple episodes of generalized axial twitching.  Concomitant EEG showed generalized high amplitude polyspikes consistent with myoclonic seizures.  EEG also showed burst suppression pattern with EEG suppression lasting 3 to 5 seconds as well as generalized highly epileptiform bursts lasting 2 to 3 seconds. Hyperventilation and photic stimulation were not performed.   ABNORMALITY -Myoclonic seizures, generalized -Burst suppression with highly epileptiform bursts, generalized IMPRESSION: This study showed multiple myoclonic seizures as well as profound diffuse encephalopathy suggestive of diffuse anoxic/hypoxic brain injury. Dr. Kerney Elbe was notified. Lora Havens   ECHOCARDIOGRAM COMPLETE  Result Date: 09/08/2019    ECHOCARDIOGRAM REPORT   Patient Name:   Michael Ramsey Date of Exam: 09/08/2019 Medical Rec #:  [id number]    Height:       68.0 in Accession #:    [id number]   Weight:       159.8 lb Date of Birth:  1955-04-06    BSA:          1.858 m Patient Age:    58 years     BP:           97/58 mmHg Patient Gender: M            HR:           58 bpm. Exam Location:  [facility] Procedure:  2D Echo, Color Doppler, Cardiac Doppler and Intracardiac            Opacification Agent Indications:     I46.9 Cardiac arrest  History:         Patient has no prior history of Echocardiogram examinations.                  CKD; Risk Factors:Hypertension.  Sonographer:     Charmayne Sheer RDCS (AE) Referring Phys:  [id number] Bradly Bienenstock Diagnosing Phys: Nelva Bush MD  Sonographer Comments: Echo performed with patient supine and on artificial respirator. IMPRESSIONS  1. Left ventricular ejection fraction, by estimation, is 25 to 30%. The left ventricle has severely decreased function. The left ventricle demonstrates regional wall motion abnormalities (see scoring diagram/findings for description). Left ventricular diastolic parameters are consistent with Grade II diastolic dysfunction (pseudonormalization). Elevated left atrial pressure. There is akinesis of the left ventricular, mid-apical anteroseptal wall, anterior wall, anterolateral wall, inferior wall and apical segment.  2. Right ventricular systolic function is normal. The right ventricular size is normal.  3. Right atrial size was mildly dilated.  4. The mitral valve is normal in structure. Mild mitral valve regurgitation. No evidence of mitral stenosis.  5. Unable to determine valve morphology due to image quality. Aortic valve regurgitation is not visualized. No aortic stenosis is present. FINDINGS  Left Ventricle: Left ventricular ejection fraction, by estimation, is 25 to 30%. The left ventricle has severely decreased function. The left ventricle demonstrates regional wall motion abnormalities. Definity contrast agent was given IV to delineate the left ventricular endocardial borders. The left ventricular internal cavity size was normal in size. There is no left ventricular hypertrophy. Left ventricular diastolic parameters are  consistent with Grade II diastolic dysfunction (pseudonormalization). Elevated left atrial pressure. Right Ventricle: Pulmonary  artery pressure is at least mildly elevated (RVSP 35-40 mmHg plus central venous pressure). The right ventricular size is normal. No increase in right ventricular wall thickness. Right ventricular systolic function is normal. Left Atrium: Left atrial size was normal in size. Right Atrium: Right atrial size was mildly dilated. Pericardium: There is no evidence of pericardial effusion. Mitral Valve: The mitral valve is normal in structure. Mild mitral valve regurgitation. No evidence of mitral valve stenosis. MV peak gradient, 5.6 mmHg. The mean mitral valve gradient is 2.0 mmHg. Tricuspid Valve: The tricuspid valve is normal in structure. Tricuspid valve regurgitation is trivial. Aortic Valve: Unable to determine valve morphology due to image quality. Aortic valve regurgitation is not visualized. No aortic stenosis is present. Aortic valve mean gradient measures 6.0 mmHg. Aortic valve peak gradient measures 11.3 mmHg. Aortic valve area, by VTI measures 1.49 cm. Pulmonic Valve: The pulmonic valve was grossly normal. Pulmonic valve regurgitation is not visualized. No evidence of pulmonic stenosis. Aorta: The aortic root is normal in size and structure. Pulmonary Artery: The pulmonary artery is of normal size. Venous: IVC assessment for right atrial pressure unable to be performed due to mechanical ventilation. IAS/Shunts: The interatrial septum was not well visualized.  LEFT VENTRICLE PLAX 2D LVIDd:         5.11 cm  Diastology LVIDs:         2.94 cm  LV e' lateral:   9.36 cm/s LV PW:         1.02 cm  LV E/e' lateral: 10.7 LV IVS:        0.96 cm  LV e' medial:    4.68 cm/s LVOT diam:     1.90 cm  LV E/e' medial:  21.5 LV SV:         47 LV SV Index:   25 LVOT Area:     2.84 cm  LEFT ATRIUM             Index LA diam:        3.80 cm 2.05 cm/m LA Vol (A2C):   37.6 ml 20.24 ml/m LA Vol (A4C):   44.7 ml 24.06 ml/m LA Biplane Vol: 41.6 ml 22.39 ml/m  AORTIC VALVE                    PULMONIC VALVE AV Area (Vmax):    1.26  cm     PV Vmax:       0.88 m/s AV Area (Vmean):   1.23 cm     PV Vmean:      60.300 cm/s AV Area (VTI):     1.49 cm     PV VTI:        0.196 m AV Vmax:           168.00 cm/s  PV Peak grad:  3.1 mmHg AV Vmean:          110.000 cm/s PV Mean grad:  2.0 mmHg AV VTI:            0.315 m AV Peak Grad:      11.3 mmHg AV Mean Grad:      6.0 mmHg LVOT Vmax:         74.60 cm/s LVOT Vmean:        47.700 cm/s LVOT VTI:          0.166 m LVOT/AV VTI ratio: 0.53  AORTA Ao  Root diam: 2.40 cm MITRAL VALVE                TRICUSPID VALVE MV Area (PHT): 3.34 cm     TR Peak grad:   37.2 mmHg MV Peak grad:  5.6 mmHg     TR Vmax:        305.00 cm/s MV Mean grad:  2.0 mmHg MV Vmax:       1.18 m/s     SHUNTS MV Vmean:      55.3 cm/s    Systemic VTI:  0.17 m MV Decel Time: 227 msec     Systemic Diam: 1.90 cm MV E velocity: 100.50 cm/s MV A velocity: 46.80 cm/s MV E/A ratio:  2.15 Christopher End MD Electronically signed by Nelva Bush MD Signature Date/Time: 09/08/2019/6:41:08 PM    Final      Assessment & Plan: Pt is a 64 y.o.   male with CKD, gout, hypertension, was admitted on 09/04/2019 with Cardiac arrest ([place]) [I46.9] ST elevation myocardial infarction (STEMI), unspecified artery ([place]) [I21.3]     #Acute kidney injury Likely secondary to ATN caused by hemodynamic instability, hypotension, cardiac arrest Patient is currently nonoliguric and urine output appears to be improving Serum creatinine 2.32 at admission,  Creatinine trends summarized below  09/07 0701 - 09/08 0700 In: 4701.5 [I.V.:4038.9; IV Piggyback:662.5] Out: 2354 [Urine:2004; Emesis/NG output:350]  Lab Results  Component Value Date   CREATININE 1.69 (H) 2019/09/19   CREATININE 1.92 (H) 09/08/2019   CREATININE 1.99 (H) 09/08/2019   Electrolytes and volume status are acceptable. Creatinine trend and UOP have improved No acute indication for dialysis at present. We will continue to follow.  #Metabolic acidosis Likely secondary to renal  insufficiency Continue  IV fluid supplementation to LR  #Acute respiratory failure Currently ventilator dependent Management as per ICU team.   LOS: 1 Macario Shear 2021/09/189:26 AM    Note: This note was prepared with Dragon dictation. Any transcription errors are unintentional

## 2019-10-02 NOTE — Progress Notes (Signed)
GOALS OF CARE DISCUSSION  The Clinical status was relayed to family in detail.  Updated and notified of patients medical condition.  Patient remains unresponsive and will not open eyes to command.    patient with increased WOB and using accessory muscles to breathe Explained to family course of therapy and the modalities     Patient with Progressive multiorgan failure with very low chance of meaningful recovery despite all aggressive and optimal medical therapy. Patient is in the Dying  Process associated with Suffering.  Family understands the situation.  They have consented and agreed to DNR/DNI and would like to proceed with Comfort care measures at some point  Will start [place] are satisfied with Plan of action and management. All questions answered     Corrin Parker, M.D.  [facility]  Medical Director [place] Director [facility] Cardio-Pulmonary Department

## 2019-10-02 NOTE — Progress Notes (Signed)
ANTICOAGULATION CONSULT NOTE  Pharmacy Consult for heparin Indication: chest pain/ACS  No Known Allergies  Patient Measurements: Height: 5\' 8"  (172.7 cm) Weight: 72.5 kg (159 lb 13.3 oz) IBW/kg (Calculated) : 68.4 Heparin Dosing Weight: 77.4 kg  Vital Signs: Temp: 97.5 F (36.4 C) (09/08 0500) Temp Source: Bladder (09/08 0500) BP: 107/65 (09/08 0500) Pulse Rate: 55 (09/08 0500)  Labs: Recent Labs    09/25/2019 2347 10/01/2019 2347 09/08/19 0339 09/08/19 1115 09/08/19 1324 09/08/19 2100 September 28, 2019 0418  HGB 11.2*   < > 11.0*  --   --   --  9.7*  HCT 36.0*  --  35.2*  --   --   --  30.3*  PLT 384  --  364  --   --   --  337  APTT 33  --  83*  --   --   --   --   LABPROT 14.4  --  14.4  --   --   --   --   INR 1.2  --  1.2  --   --   --   --   HEPARINUNFRC  --   --   --   --  0.27* 0.51 0.58  CREATININE 2.32*   < > 1.99* 1.92*  --   --  1.69*  TROPONINIHS 11,411*  --  12,764* 9,141*  --   --   --    < > = values in this interval not displayed.    Estimated Creatinine Clearance: 42.2 mL/min (A) (by C-G formula based on SCr of 1.69 mg/dL (H)).   Medical History: History reviewed. No pertinent past medical history.   Assessment: Patient admitted w/ CPR in progress w/ trops elevated (5 minutes CPR was given). CXR chest shows diffuse bilateral airspace disease. No anticoag PTA. Patient being started on heparin drip for ACS/NSTEMI.  Goal of Therapy:  Heparin level 0.3-0.7 units/ml Monitor platelets by anticoagulation protocol: Yes   Plan:  09/08 @ 0400 HL 0.58 therapeutic. Will continue current rate and will recheck HL at 1100, CBC trending down will continue to monitor.  Tobie Lords, PharmD, BCPS Clinical Pharmacist 09-28-19,5:15 AM

## 2019-10-02 NOTE — Plan of Care (Signed)

## 2019-10-02 NOTE — Progress Notes (Addendum)
Progress Note  Patient Name: Michael Ramsey Date of Encounter: 2019-09-27  [facility] Cardiologist: New-Dr. Saunders Revel rounding  Subjective   No acute events overnight, patient intubated, sedated.  Inpatient Medications    Scheduled Meds: . aspirin  81 mg Per Tube Daily  . chlorhexidine gluconate (MEDLINE KIT)  15 mL Mouth Rinse BID  . Chlorhexidine Gluconate Cloth  6 each Topical Q0600  . insulin aspart  0-15 Units Subcutaneous Q4H  . mouth rinse  15 mL Mouth Rinse 10 times per day   Continuous Infusions: . sodium chloride Stopped (09-27-19 1058)  . ampicillin-sulbactam (UNASYN) IV Stopped (27-Sep-2019 0940)  . dextrose    . famotidine (PEPCID) IV 100 mL/hr at 2019/09/27 1100  . fentaNYL infusion INTRAVENOUS 100 mcg/hr (September 27, 2019 1100)  . lactated ringers 75 mL/hr at 09/27/19 1100  . levETIRAcetam Stopped (2019/09/27 1013)  . morphine    . norepinephrine (LEVOPHED) Adult infusion 8 mcg/min (Sep 27, 2019 1100)  . propofol (DIPRIVAN) infusion 80 mcg/kg/min (09/27/19 1100)  . valproate sodium Stopped (09-27-2019 0709)   PRN Meds: sodium chloride, acetaminophen **OR** acetaminophen, diphenhydrAMINE, fentaNYL, glycopyrrolate **OR** glycopyrrolate **OR** glycopyrrolate, LORazepam, midazolam, morphine injection, morphine, polyvinyl alcohol   Vital Signs    Vitals:   2019-09-27 0800 September 27, 2019 0900 2019/09/27 1000 September 27, 2019 1100  BP: (!) 106/59 107/66 106/68 (!) 101/59  Pulse: (!) 41 63 65 64  Resp: 20 20 18 20   Temp: 98.8 F (37.1 C) 98.6 F (37 C) 98.6 F (37 C)   TempSrc: Bladder     SpO2: 98% 99% 100% 100%  Weight:      Height:        Intake/Output Summary (Last 24 hours) at 2019-09-27 1120 Last data filed at 27-Sep-2019 1100 Gross per 24 hour  Intake 5224.29 ml  Output 2214 ml  Net 3010.29 ml   Last 3 Weights 09/08/2019 09/12/2019  Weight (lbs) 159 lb 13.3 oz 170 lb 10.2 oz  Weight (kg) 72.5 kg 77.4 kg      Telemetry    Sinus rhythm- Personally Reviewed  ECG    No new tracing  obtained- Personally Reviewed  Physical Exam   GEN:  Intubated, sedated Neck: No JVD Cardiac: RRR,   Respiratory:  Vented breath sounds GI: Soft, nontender, non-distended  MS: No edema; No deformity. Neuro:  Unable to assess Psych: Unable to assess  Labs    High Sensitivity Troponin:   Recent Labs  Lab 09/29/2019 2347 09/08/19 0339 09/08/19 1115  TROPONINIHS 11,411* 12,764* 9,141*      Chemistry Recent Labs  Lab 09/03/2019 2347 09/29/2019 2347 09/08/19 0339 09/08/19 1115 Sep 27, 2019 0418  NA 140   < > 139 138 140  K 4.4   < > 3.9 3.8 3.4*  CL 105   < > 107 109 114*  CO2 21*   < > 18* 16* 18*  GLUCOSE 278*   < > 215* 130* 136*  BUN 42*   < > 44* 40* 32*  CREATININE 2.32*   < > 1.99* 1.92* 1.69*  CALCIUM 8.2*   < > 8.2* 7.7* 7.9*  PROT 7.1  --  6.9  --  5.6*  ALBUMIN 3.2*  --  3.4*  --  2.6*  AST 44*  --  40  --  28  ALT 35  --  35  --  26  ALKPHOS 100  --  87  --  65  BILITOT 0.5  --  0.8  --  0.7  GFRNONAA 28*   < >  34* 36* 42*  GFRAA 33*   < > 40* 41* 48*  ANIONGAP 14   < > 14 13 8    < > = values in this interval not displayed.     Hematology Recent Labs  Lab 09/25/2019 2347 09/08/19 0339 20-Sep-2019 0418  WBC 14.1* 13.5* 12.9*  RBC 3.75* 3.73* 3.22*  HGB 11.2* 11.0* 9.7*  HCT 36.0* 35.2* 30.3*  MCV 96.0 94.4 94.1  MCH 29.9 29.5 30.1  MCHC 31.1 31.3 32.0  RDW 14.5 14.4 14.6  PLT 384 364 337    BNP Recent Labs  Lab 09/13/2019 2347  BNP 1,073.8*     DDimer No results for input(s): DDIMER in the last 168 hours.   Radiology    DG Abdomen 1 View  Result Date: 09/08/2019 CLINICAL DATA:  Orogastric tube placement EXAM: ABDOMEN - 1 VIEW COMPARISON:  None. FINDINGS: Tip of the orogastric tube is in the stomach. The side port is just below the gastroesophageal junction. Recommend advancing 5 cm. Large amount of stool in the left colon. IMPRESSION: Orogastric tube side port just below the gastroesophageal junction. Recommend advancing 5 cm. Electronically Signed    By: Ulyses Jarred M.D.   On: 09/08/2019 00:36   CT Head Wo Contrast  Result Date: 09/08/2019 CLINICAL DATA:  Mental status changes EXAM: CT HEAD WITHOUT CONTRAST TECHNIQUE: Contiguous axial images were obtained from the base of the skull through the vertex without intravenous contrast. COMPARISON:  10/29/2007 FINDINGS: Brain: There is atrophy and chronic small vessel disease changes. No acute intracranial abnormality. Specifically, no hemorrhage, hydrocephalus, mass lesion, acute infarction, or significant intracranial injury. Vascular: No hyperdense vessel or unexpected calcification. Skull: No acute calvarial abnormality. Sinuses/Orbits: Visualized paranasal sinuses and mastoids clear. Orbital soft tissues unremarkable. Other: None IMPRESSION: Atrophy, chronic microvascular disease. No acute intracranial abnormality. Electronically Signed   By: Rolm Baptise M.D.   On: 09/08/2019 00:27   DG Chest Portable 1 View  Result Date: 09/08/2019 CLINICAL DATA:  Intubated EXAM: PORTABLE CHEST 1 VIEW COMPARISON:  05/15/2019 FINDINGS: Endotracheal tube is 1.6 cm above the carina. NG tube is in the stomach. Heart is upper limits normal in size. Diffuse bilateral airspace disease. No effusions. No acute bony abnormality. IMPRESSION: Support devices as above. Diffuse bilateral airspace disease, likely infection. Electronically Signed   By: Rolm Baptise M.D.   On: 09/08/2019 00:36   EEG adult  Result Date: 09/08/2019 Lora Havens, MD     09/08/2019  4:02 PM Patient Name: Michael Ramsey MRN: [id number] Epilepsy Attending: Lora Havens Referring Physician/Provider: Darel Hong, NP Date: 09/08/2019 Duration: 24.46 minutes Patient history: 64 year old male status post cardiac arrest and noted to have seizure-like episodes.  EEG to evaluate for seizures. Level of alertness: Comatose AEDs during EEG study: LEV, Propofol, Versed Technical aspects: This EEG study was done with scalp electrodes positioned according to the  10-20 International system of electrode placement. Electrical activity was acquired at a sampling rate of 500Hz  and reviewed with a high frequency filter of 70Hz  and a low frequency filter of 1Hz . EEG data were recorded continuously and digitally stored. Description: Patient was noted to have multiple episodes of generalized axial twitching.  Concomitant EEG showed generalized high amplitude polyspikes consistent with myoclonic seizures.  EEG also showed burst suppression pattern with EEG suppression lasting 3 to 5 seconds as well as generalized highly epileptiform bursts lasting 2 to 3 seconds. Hyperventilation and photic stimulation were not performed.   ABNORMALITY -Myoclonic seizures, generalized -Burst suppression  with highly epileptiform bursts, generalized IMPRESSION: This study showed multiple myoclonic seizures as well as profound diffuse encephalopathy suggestive of diffuse anoxic/hypoxic brain injury. Dr. Kerney Elbe was notified. Lora Havens   ECHOCARDIOGRAM COMPLETE  Result Date: 09/08/2019    ECHOCARDIOGRAM REPORT   Patient Name:   JOVONI BORKENHAGEN Date of Exam: 09/08/2019 Medical Rec #:  [id number]    Height:       68.0 in Accession #:    [id number]   Weight:       159.8 lb Date of Birth:  08-17-1955    BSA:          1.858 m Patient Age:    47 years     BP:           97/58 mmHg Patient Gender: M            HR:           58 bpm. Exam Location:  [facility] Procedure: 2D Echo, Color Doppler, Cardiac Doppler and Intracardiac            Opacification Agent Indications:     I46.9 Cardiac arrest  History:         Patient has no prior history of Echocardiogram examinations.                  CKD; Risk Factors:Hypertension.  Sonographer:     Charmayne Sheer RDCS (AE) Referring Phys:  [id number] Bradly Bienenstock Diagnosing Phys: Nelva Bush MD  Sonographer Comments: Echo performed with patient supine and on artificial respirator. IMPRESSIONS  1. Left ventricular ejection fraction, by estimation, is 25 to 30%. The left  ventricle has severely decreased function. The left ventricle demonstrates regional wall motion abnormalities (see scoring diagram/findings for description). Left ventricular diastolic parameters are consistent with Grade II diastolic dysfunction (pseudonormalization). Elevated left atrial pressure. There is akinesis of the left ventricular, mid-apical anteroseptal wall, anterior wall, anterolateral wall, inferior wall and apical segment.  2. Right ventricular systolic function is normal. The right ventricular size is normal.  3. Right atrial size was mildly dilated.  4. The mitral valve is normal in structure. Mild mitral valve regurgitation. No evidence of mitral stenosis.  5. Unable to determine valve morphology due to image quality. Aortic valve regurgitation is not visualized. No aortic stenosis is present. FINDINGS  Left Ventricle: Left ventricular ejection fraction, by estimation, is 25 to 30%. The left ventricle has severely decreased function. The left ventricle demonstrates regional wall motion abnormalities. Definity contrast agent was given IV to delineate the left ventricular endocardial borders. The left ventricular internal cavity size was normal in size. There is no left ventricular hypertrophy. Left ventricular diastolic parameters are consistent with Grade II diastolic dysfunction (pseudonormalization). Elevated left atrial pressure. Right Ventricle: Pulmonary artery pressure is at least mildly elevated (RVSP 35-40 mmHg plus central venous pressure). The right ventricular size is normal. No increase in right ventricular wall thickness. Right ventricular systolic function is normal. Left Atrium: Left atrial size was normal in size. Right Atrium: Right atrial size was mildly dilated. Pericardium: There is no evidence of pericardial effusion. Mitral Valve: The mitral valve is normal in structure. Mild mitral valve regurgitation. No evidence of mitral valve stenosis. MV peak gradient, 5.6 mmHg. The  mean mitral valve gradient is 2.0 mmHg. Tricuspid Valve: The tricuspid valve is normal in structure. Tricuspid valve regurgitation is trivial. Aortic Valve: Unable to determine valve morphology due to image quality. Aortic valve regurgitation is not visualized. No aortic  stenosis is present. Aortic valve mean gradient measures 6.0 mmHg. Aortic valve peak gradient measures 11.3 mmHg. Aortic valve area, by VTI measures 1.49 cm. Pulmonic Valve: The pulmonic valve was grossly normal. Pulmonic valve regurgitation is not visualized. No evidence of pulmonic stenosis. Aorta: The aortic root is normal in size and structure. Pulmonary Artery: The pulmonary artery is of normal size. Venous: IVC assessment for right atrial pressure unable to be performed due to mechanical ventilation. IAS/Shunts: The interatrial septum was not well visualized.  LEFT VENTRICLE PLAX 2D LVIDd:         5.11 cm  Diastology LVIDs:         2.94 cm  LV e' lateral:   9.36 cm/s LV PW:         1.02 cm  LV E/e' lateral: 10.7 LV IVS:        0.96 cm  LV e' medial:    4.68 cm/s LVOT diam:     1.90 cm  LV E/e' medial:  21.5 LV SV:         47 LV SV Index:   25 LVOT Area:     2.84 cm  LEFT ATRIUM             Index Michael diam:        3.80 cm 2.05 cm/m Michael Vol (A2C):   37.6 ml 20.24 ml/m Michael Vol (A4C):   44.7 ml 24.06 ml/m Michael Biplane Vol: 41.6 ml 22.39 ml/m  AORTIC VALVE                    PULMONIC VALVE AV Area (Vmax):    1.26 cm     PV Vmax:       0.88 m/s AV Area (Vmean):   1.23 cm     PV Vmean:      60.300 cm/s AV Area (VTI):     1.49 cm     PV VTI:        0.196 m AV Vmax:           168.00 cm/s  PV Peak grad:  3.1 mmHg AV Vmean:          110.000 cm/s PV Mean grad:  2.0 mmHg AV VTI:            0.315 m AV Peak Grad:      11.3 mmHg AV Mean Grad:      6.0 mmHg LVOT Vmax:         74.60 cm/s LVOT Vmean:        47.700 cm/s LVOT VTI:          0.166 m LVOT/AV VTI ratio: 0.53  AORTA Ao Root diam: 2.40 cm MITRAL VALVE                TRICUSPID VALVE MV Area (PHT):  3.34 cm     TR Peak grad:   37.2 mmHg MV Peak grad:  5.6 mmHg     TR Vmax:        305.00 cm/s MV Mean grad:  2.0 mmHg MV Vmax:       1.18 m/s     SHUNTS MV Vmean:      55.3 cm/s    Systemic VTI:  0.17 m MV Decel Time: 227 msec     Systemic Diam: 1.90 cm MV E velocity: 100.50 cm/s MV A velocity: 46.80 cm/s MV E/A ratio:  2.15 Harrell Gave End MD Electronically signed by Nelva Bush MD Signature Date/Time: 09/08/2019/6:41:08  PM    Final     Cardiac Studies   Echo 09/08/2019 1. Left ventricular ejection fraction, by estimation, is 25 to 30%. The  left ventricle has severely decreased function. The left ventricle  demonstrates regional wall motion abnormalities (see scoring  diagram/findings for description). Left ventricular  diastolic parameters are consistent with Grade II diastolic dysfunction  (pseudonormalization). Elevated left atrial pressure. There is akinesis of  the left ventricular, mid-apical anteroseptal wall, anterior wall,  anterolateral wall, inferior wall and  apical segment.  2. Right ventricular systolic function is normal. The right ventricular  size is normal.  3. Right atrial size was mildly dilated.  4. The mitral valve is normal in structure. Mild mitral valve  regurgitation. No evidence of mitral stenosis.  5. Unable to determine valve morphology due to image quality. Aortic  valve regurgitation is not visualized. No aortic stenosis is present  Patient Profile     64 y.o. male with history of hypertension, CKD presenting to the hospital with cardiac arrest status post CPR with ROSC.  EKG showing anterior ST elevation, clinical course complicated by no purposeful neurologic activity concerning for anoxic brain injury.  Assessment & Plan    1.  Cardiac arrest, possible anterior STEMI -Troponins peaked at [phone] -Getting heparin drip -Aspirin statin -Levophed for pressor support. -Undergoing cooling protocol -No cardiac interventions planned unless patient  develops significant neurologic activity. -Echo showing severely reduced ejection fraction  2.  Possible anoxic brain injury -Follow-up per critical care and neurology -No adequate neurologic activity  Patient is critically ill with multiorgan failure, requiring drip for pressor support, needing vent support for respiration,.  Prognosis appears poor.  Ongoing discussions between primary team/critical care team and family ongoing regarding goals of care.  Total encounter time 35 minutes   Addendum   Upon chart review, ICU team discussing goals of care with patient's family.  CODE STATUS changed to DNR as patient is in the dying process.  There is also plan to proceed with comfort care measures.  At this point, cardiology will sign off.  Please let us know if any additional input is needed.  Thank you for letting us take part in this patient's care.  Signed, Kate Sable, MD  2019-09-22, 11:20 AM

## 2019-10-02 NOTE — Progress Notes (Signed)
GOALS OF CARE DISCUSSION  The Clinical status was relayed to family in detail.  Updated and notified of patients medical condition.  Patient remains unresponsive and will not open eyes to command.   Patient showing signs of severe brain damage patient with increased WOB and using accessory muscles to breathe Explained to family course of therapy and the modalities     Patient with Progressive multiorgan failure with very low chance of meaningful recovery despite all aggressive and optimal medical therapy. Patient is in the Dying  Process associated with Suffering.  Family understands the situation.  They have consented and agreed to DNR/DNI and would like to proceed with Comfort care measures.  Family are satisfied with Plan of action and management. All questions answered     Corrin Parker, M.D.  [facility]  Medical Director [place] Director [facility]

## 2019-10-02 NOTE — Progress Notes (Addendum)
Subjective: Continues to be intubated and sedated.   Objective: Current vital signs: BP (!) 106/59 (BP Location: Left Arm)   Pulse (!) 41   Temp 98.8 F (37.1 C) (Bladder)   Resp 20   Ht _0  (1.727 m)   Wt 72.5 kg   SpO2 98%   BMI 24.30 kg/m  Vital signs in last 24 hours: Temp:  [95.5 F (35.3 C)-99 F (37.2 C)] 98.8 F (37.1 C) (09/08 0800) Pulse Rate:  [41-67] 41 (09/08 0800) Resp:  [18-21] 20 (09/08 0800) BP: (84-111)/(53-73) 106/59 (09/08 0800) SpO2:  [98 %-100 %] 98 % (09/08 0800) Arterial Line BP: (100-134)/(45-69) 124/54 (09/08 0800) FiO2 (%):  [28 %-50 %] 28 % (09/08 0800)  Intake/Output from previous day: 09/07 0701 - 09/08 0700 In: 4701.5 [I.V.:4038.9; IV Piggyback:662.5] Out: 2354 [Urine:2004; Emesis/NG output:350] Intake/Output this shift: Total I/O In: 156.4 [I.V.:147.8; IV Piggyback:8.6] Out: 0  Nutritional status:  Diet Order    None     HEENT: [place]/AT Lungs: Intubated Ext: No edema  Neurologic Exam: Performed 40 minutes after propofol and fentanyl were held Ment: No eye opening to loud clap. No purposeful or nonpurposeful movement other than lower extremity and abdominal myoclonus. Not following commands. No attempts to communicate.  CN: Pupils 3 mm and unreactive. No blink to threat. No doll's eye reflex. Eyes conjugately at the midline. No corneal reflex.  Motor: Flaccid tone x 4 except during myoclonic jerks. No movement of upper or lower extremities to pinch. Slow, stereotyped dorsiflexion of feet in response to noxious plantar stimulation.  Myoclonic jerking as follows: Face and arms are spared. Abdominal myoclonus occurs intermittently. Myoclonus of lower extremities involves rapid, irregular plantar flexion of feet and toes bilaterally but asynchronously. There is palpable associated muscle contraction of calves and plantar aspects of the feet. Thighs are spared.  Sensory: As above.  Reflexes: Trace bilateral brachioradialis. 0 bilateral  patellae.   Lab Results: Results for orders placed or performed during the hospital encounter of 09/15/2019 (from the past 48 hour(s))  Glucose, capillary     Status: Abnormal   Collection Time: 09/08/2019 11:43 PM  Result Value Ref Range   Glucose-Capillary 259 (H) 70 - 99 mg/dL    Comment: Glucose reference range applies only to samples taken after fasting for at least 8 hours.  Protime-INR     Status: None   Collection Time: 09/10/2019 11:47 PM  Result Value Ref Range   Prothrombin Time 14.4 11.4 - 15.2 seconds   INR 1.2 0.8 - 1.2    Comment: (NOTE) INR goal varies based on device and disease states. Performed at [facility], [address] [place], [place] [address]   APTT     Status: None   Collection Time: 09/24/2019 11:47 PM  Result Value Ref Range   aPTT 33 24 - 36 seconds    Comment: Performed at [facility], [address] [place], [place] [address]  CBC with Differential/Platelet     Status: Abnormal   Collection Time: 09/24/2019 11:47 PM  Result Value Ref Range   WBC 14.1 (H) 4.0 - 10.5 K/uL   RBC 3.75 (L) 4.22 - 5.81 MIL/uL   Hemoglobin 11.2 (L) 13.0 - 17.0 g/dL   HCT 36.0 (L) 39 - 52 %   MCV 96.0 80.0 - 100.0 fL   MCH 29.9 26.0 - 34.0 pg   MCHC 31.1 30.0 - 36.0 g/dL   RDW 14.5 11.5 - 15.5 %   Platelets 384 150 -  400 K/uL   nRBC 0.0 0.0 - 0.2 %   Neutrophils Relative % 63 %   Neutro Abs 8.8 (H) 1.7 - 7.7 K/uL   Lymphocytes Relative 28 %   Lymphs Abs 4.0 0.7 - 4.0 K/uL   Monocytes Relative 5 %   Monocytes Absolute 0.8 0 - 1 K/uL   Eosinophils Relative 1 %   Eosinophils Absolute 0.1 0 - 0 K/uL   Basophils Relative 0 %   Basophils Absolute 0.0 0 - 0 K/uL   Immature Granulocytes 3 %   Abs Immature Granulocytes 0.39 (H) 0.00 - 0.07 K/uL    Comment: Performed at [facility], [address] [place], [place] [address]  Comprehensive metabolic panel     Status: Abnormal   Collection Time: 09/10/2019 11:47 PM  Result Value Ref Range    Sodium 140 135 - 145 mmol/L   Potassium 4.4 3.5 - 5.1 mmol/L   Chloride 105 98 - 111 mmol/L   CO2 21 (L) 22 - 32 mmol/L   Glucose, Bld 278 (H) 70 - 99 mg/dL    Comment: Glucose reference range applies only to samples taken after fasting for at least 8 hours.   BUN 42 (H) 8 - 23 mg/dL   Creatinine, Ser 2.32 (H) 0.61 - 1.24 mg/dL   Calcium 8.2 (L) 8.9 - 10.3 mg/dL   Total Protein 7.1 6.5 - 8.1 g/dL   Albumin 3.2 (L) 3.5 - 5.0 g/dL   AST 44 (H) 15 - 41 U/L   ALT 35 0 - 44 U/L   Alkaline Phosphatase 100 38 - 126 U/L   Total Bilirubin 0.5 0.3 - 1.2 mg/dL   GFR calc non Af Amer 28 (L) >60 mL/min   GFR calc Af Amer 33 (L) >60 mL/min   Anion gap 14 5 - 15    Comment: Performed at [facility], [address] [place], [place] [address]  Troponin I (High Sensitivity)     Status: Abnormal   Collection Time: 09/25/2019 11:47 PM  Result Value Ref Range   Troponin I (High Sensitivity) 11,411 (HH) <18 ng/L    Comment: CRITICAL RESULT CALLED TO, READ BACK BY AND VERIFIED WITH MAC BROWN AT 0981 ON 09/08/19 RWW (NOTE) Elevated high sensitivity troponin I (hsTnI) values and significant  changes across serial measurements may suggest ACS but many other  chronic and acute conditions are known to elevate hsTnI results.  Refer to the "Links" section for chest pain algorithms and additional  guidance. Performed at [facility], [address] [place], [place] [address]   SARS Coronavirus 2 by RT PCR (hospital order, performed in [facility] hospital lab) Nasopharyngeal Nasopharyngeal Swab     Status: None   Collection Time: 09/17/2019 11:47 PM   Specimen: Nasopharyngeal Swab  Result Value Ref Range   SARS Coronavirus 2 NEGATIVE NEGATIVE    Comment: (NOTE) SARS-CoV-2 target nucleic acids are NOT DETECTED.  The SARS-CoV-2 RNA is generally detectable in upper and lower respiratory specimens during the acute phase of infection. The lowest concentration of SARS-CoV-2 viral copies this  assay can detect is 250 copies / mL. A negative result does not preclude SARS-CoV-2 infection and should not be used as the sole basis for treatment or other patient management decisions.  A negative result may occur with improper specimen collection / handling, submission of specimen other than nasopharyngeal swab, presence of viral mutation(s) within the areas targeted by this assay, and inadequate number of viral copies (<  250 copies / mL). A negative result must be combined with clinical observations, patient history, and epidemiological information.  Fact Sheet for Patients:   StrictlyIdeas.no  Fact Sheet for Healthcare Providers: BankingDealers.co.za  This test is not yet approved or  cleared by the [place] FDA and has been authorized for detection and/or diagnosis of SARS-CoV-2 by FDA under an Emergency Use Authorization (EUA).  This EUA will remain in effect (meaning this test can be used) for the duration of the COVID-19 declaration under Section 564(b)(1) of the Act, 21 U.S.C. section 360bbb-3(b)(1), unless the authorization is terminated or revoked sooner.  Performed at [facility], [address] [place], [place] [address]   Lactic acid, plasma     Status: Abnormal   Collection Time: 09/06/2019 11:47 PM  Result Value Ref Range   Lactic Acid, Venous 4.3 (HH) 0.5 - 1.9 mmol/L    Comment: CRITICAL RESULT CALLED TO, READ BACK BY AND VERIFIED WITH MAC BROWN AT 0042 ON 09/08/19 RWW Performed at [facility], [address] [place], [place] [address]   Urinalysis, Complete w Microscopic Urine, Catheterized     Status: Abnormal   Collection Time: 10/01/2019 11:47 PM  Result Value Ref Range   Color, Urine YELLOW (A) YELLOW   APPearance TURBID (A) CLEAR   Specific Gravity, Urine 1.018 1.005 - 1.030   pH 5.0 5.0 - 8.0   Glucose, UA 50 (A) NEGATIVE mg/dL   Hgb urine dipstick NEGATIVE NEGATIVE   Bilirubin  Urine NEGATIVE NEGATIVE   Ketones, ur NEGATIVE NEGATIVE mg/dL   Protein, ur >=300 (A) NEGATIVE mg/dL   Nitrite NEGATIVE NEGATIVE   Leukocytes,Ua NEGATIVE NEGATIVE   RBC / HPF 11-20 0 - 5 RBC/hpf   WBC, UA >50 (H) 0 - 5 WBC/hpf   Bacteria, UA RARE (A) NONE SEEN   Squamous Epithelial / LPF 0-5 0 - 5   WBC Clumps PRESENT    Mucus PRESENT    Budding Yeast PRESENT    Non Squamous Epithelial PRESENT (A) NONE SEEN   Sperm, UA PRESENT     Comment: Performed at [facility], [address] [place], [place] [address]  Brain natriuretic peptide     Status: Abnormal   Collection Time: 09/20/2019 11:47 PM  Result Value Ref Range   B Natriuretic Peptide 1,073.8 (H) 0.0 - 100.0 pg/mL    Comment: Performed at [facility], [address] [place], [place] [address]  Strep pneumoniae urinary antigen     Status: None   Collection Time: 09/08/2019 11:47 PM  Result Value Ref Range   Strep Pneumo Urinary Antigen NEGATIVE NEGATIVE    Comment:        Infection due to S. pneumoniae cannot be absolutely ruled out since the antigen present may be below the detection limit of the test. Performed at [facility], [address], [place], [place] [address]   Urine Drug Screen, Qualitative ([facility] only)     Status: None   Collection Time: 09/27/2019 11:47 PM  Result Value Ref Range   Tricyclic, Ur Screen NONE DETECTED NONE DETECTED   Amphetamines, Ur Screen NONE DETECTED NONE DETECTED   MDMA (Ecstasy)Ur Screen NONE DETECTED NONE DETECTED   Cocaine Metabolite,Ur [place] NONE DETECTED NONE DETECTED   Opiate, Ur Screen NONE DETECTED NONE DETECTED   Phencyclidine (PCP) Ur S NONE DETECTED NONE DETECTED   Cannabinoid 50 Ng, Ur [place] NONE DETECTED NONE DETECTED   Barbiturates, Ur Screen NONE DETECTED NONE DETECTED  Benzodiazepine, Ur Scrn NONE DETECTED NONE DETECTED   Methadone Scn, Ur NONE DETECTED NONE DETECTED    Comment: (NOTE) Tricyclics + metabolites, urine    Cutoff 1000  ng/mL Amphetamines + metabolites, urine  Cutoff 1000 ng/mL MDMA (Ecstasy), urine              Cutoff 500 ng/mL Cocaine Metabolite, urine          Cutoff 300 ng/mL Opiate + metabolites, urine        Cutoff 300 ng/mL Phencyclidine (PCP), urine         Cutoff 25 ng/mL Cannabinoid, urine                 Cutoff 50 ng/mL Barbiturates + metabolites, urine  Cutoff 200 ng/mL Benzodiazepine, urine              Cutoff 200 ng/mL Methadone, urine                   Cutoff 300 ng/mL  The urine drug screen provides only a preliminary, unconfirmed analytical test result and should not be used for non-medical purposes. Clinical consideration and professional judgment should be applied to any positive drug screen result due to possible interfering substances. A more specific alternate chemical method must be used in order to obtain a confirmed analytical result. Gas chromatography / mass spectrometry (GC/MS) is the preferred confirm atory method. Performed at [facility], [address] [place], [place] [address]   Blood gas, arterial ([facility] & [facility] ONLY)     Status: Abnormal   Collection Time: 09/21/2019 11:51 PM  Result Value Ref Range   FIO2 1.00    Delivery systems VENTILATOR    Mode ASSIST CONTROL    VT 450 mL   LHR 20 resp/min   Peep/cpap 5.0 cm H20   pH, Arterial 7.13 (LL) 7.35 - 7.45    Comment: CRITICAL RESULT CALLED TO, READ BACK BY AND VERIFIED WITH: VERONESE C MD AT 0000 09/08/2019 BY S DAVID RRT    pCO2 arterial 54 (H) 32 - 48 mmHg   pO2, Arterial 431 (H) 83 - 108 mmHg   Bicarbonate 18.0 (L) 20.0 - 28.0 mmol/L   Acid-base deficit 11.1 (H) 0.0 - 2.0 mmol/L   O2 Saturation 100.0 %   Patient temperature 37.0    Collection site LEFT BRACHIAL    Sample type ARTERIAL DRAW     Comment: Performed at [facility], [address] [place], [place] [address]  Arterial Blood Gas PRN     Status: Abnormal   Collection Time: 09/08/19 12:54 AM  Result Value Ref Range   FIO2 1.00     Delivery systems VENTILATOR    Mode ASSIST CONTROL    VT 450 mL   LHR 20 resp/min   Peep/cpap 5.0 cm H20   pH, Arterial 7.25 (L) 7.35 - 7.45   pCO2 arterial 46 32 - 48 mmHg   pO2, Arterial 384 (H) 83 - 108 mmHg   Bicarbonate 20.2 20.0 - 28.0 mmol/L   Acid-base deficit 6.9 (H) 0.0 - 2.0 mmol/L   O2 Saturation 99.9 %   Patient temperature 37.0    Collection site RIGHT RADIAL    Sample type ARTERIAL DRAW    Allens test (pass/fail) PASS PASS    Comment: Performed at [facility], [address] [place], [place] [address]  CULTURE, BLOOD (ROUTINE X 2) w Reflex to ID Panel     Status: None (Preliminary result)  Collection Time: 09/08/19  1:14 AM   Specimen: BLOOD  Result Value Ref Range   Specimen Description BLOOD LT FOREARM    Special Requests      BOTTLES DRAWN AEROBIC AND ANAEROBIC Blood Culture adequate volume   Culture      NO GROWTH < 12 HOURS Performed at [facility], [address], [place], [place] [address]    Report Status PENDING   CULTURE, BLOOD (ROUTINE X 2) w Reflex to ID Panel     Status: None (Preliminary result)   Collection Time: 09/08/19  1:14 AM   Specimen: BLOOD  Result Value Ref Range   Specimen Description BLOOD RAC    Special Requests      BOTTLES DRAWN AEROBIC AND ANAEROBIC Blood Culture adequate volume   Culture      NO GROWTH < 12 HOURS Performed at [facility], [address], [place], [place] [address]    Report Status PENDING   Glucose, capillary     Status: Abnormal   Collection Time: 09/08/19  2:44 AM  Result Value Ref Range   Glucose-Capillary 217 (H) 70 - 99 mg/dL    Comment: Glucose reference range applies only to samples taken after fasting for at least 8 hours.  MRSA PCR Screening     Status: None   Collection Time: 09/08/19  2:44 AM   Specimen: Nasopharyngeal  Result Value Ref Range   MRSA by PCR NEGATIVE NEGATIVE    Comment:        The GeneXpert MRSA Assay (FDA approved for NASAL  specimens only), is one component of a comprehensive MRSA colonization surveillance program. It is not intended to diagnose MRSA infection nor to guide or monitor treatment for MRSA infections. Performed at [facility], [address] [place], [place] [address]   Procalcitonin - Baseline     Status: None   Collection Time: 09/08/19  3:39 AM  Result Value Ref Range   Procalcitonin 3.20 ng/mL    Comment:        Interpretation: PCT > 2 ng/mL: Systemic infection (sepsis) is likely, unless other causes are known. (NOTE)       Sepsis PCT Algorithm           Lower Respiratory Tract                                      Infection PCT Algorithm    ----------------------------     ----------------------------         PCT < 0.25 ng/mL                PCT < 0.10 ng/mL          Strongly encourage             Strongly discourage   discontinuation of antibiotics    initiation of antibiotics    ----------------------------     -----------------------------       PCT 0.25 - 0.50 ng/mL            PCT 0.10 - 0.25 ng/mL               OR       >80% decrease in PCT            Discourage initiation of  antibiotics      Encourage discontinuation           of antibiotics    ----------------------------     -----------------------------         PCT >= 0.50 ng/mL              PCT 0.26 - 0.50 ng/mL               AND       <80% decrease in PCT              Encourage initiation of                                             antibiotics       Encourage continuation           of antibiotics    ----------------------------     -----------------------------        PCT >= 0.50 ng/mL                  PCT > 0.50 ng/mL               AND         increase in PCT                  Strongly encourage                                      initiation of antibiotics    Strongly encourage escalation           of antibiotics                                      -----------------------------                                           PCT <= 0.25 ng/mL                                                 OR                                        > 80% decrease in PCT                                      Discontinue / Do not initiate                                             antibiotics  Performed at [facility], [address], [place], [place] [address]   CBC with Differential/Platelet     Status: Abnormal   Collection  Time: 09/08/19  3:39 AM  Result Value Ref Range   WBC 13.5 (H) 4.0 - 10.5 K/uL   RBC 3.73 (L) 4.22 - 5.81 MIL/uL   Hemoglobin 11.0 (L) 13.0 - 17.0 g/dL   HCT 35.2 (L) 39 - 52 %   MCV 94.4 80.0 - 100.0 fL   MCH 29.5 26.0 - 34.0 pg   MCHC 31.3 30.0 - 36.0 g/dL   RDW 14.4 11.5 - 15.5 %   Platelets 364 150 - 400 K/uL   nRBC 0.0 0.0 - 0.2 %   Neutrophils Relative % 87 %   Neutro Abs 11.8 (H) 1.7 - 7.7 K/uL   Lymphocytes Relative 5 %   Lymphs Abs 0.6 (L) 0.7 - 4.0 K/uL   Monocytes Relative 7 %   Monocytes Absolute 0.9 0 - 1 K/uL   Eosinophils Relative 0 %   Eosinophils Absolute 0.0 0 - 0 K/uL   Basophils Relative 0 %   Basophils Absolute 0.0 0 - 0 K/uL   Immature Granulocytes 1 %   Abs Immature Granulocytes 0.12 (H) 0.00 - 0.07 K/uL    Comment: Performed at [facility], [address] [place], [place] [address]  Comprehensive metabolic panel     Status: Abnormal   Collection Time: 09/08/19  3:39 AM  Result Value Ref Range   Sodium 139 135 - 145 mmol/L   Potassium 3.9 3.5 - 5.1 mmol/L   Chloride 107 98 - 111 mmol/L   CO2 18 (L) 22 - 32 mmol/L   Glucose, Bld 215 (H) 70 - 99 mg/dL    Comment: Glucose reference range applies only to samples taken after fasting for at least 8 hours.   BUN 44 (H) 8 - 23 mg/dL   Creatinine, Ser 1.99 (H) 0.61 - 1.24 mg/dL   Calcium 8.2 (L) 8.9 - 10.3 mg/dL   Total Protein 6.9 6.5 - 8.1 g/dL   Albumin 3.4 (L) 3.5 - 5.0 g/dL   AST 40 15 - 41 U/L   ALT 35 0 - 44 U/L    Alkaline Phosphatase 87 38 - 126 U/L   Total Bilirubin 0.8 0.3 - 1.2 mg/dL   GFR calc non Af Amer 34 (L) >60 mL/min   GFR calc Af Amer 40 (L) >60 mL/min   Anion gap 14 5 - 15    Comment: Performed at [facility], [address] [place], [place] [address]  Protime-INR now     Status: None   Collection Time: 09/08/19  3:39 AM  Result Value Ref Range   Prothrombin Time 14.4 11.4 - 15.2 seconds   INR 1.2 0.8 - 1.2    Comment: (NOTE) INR goal varies based on device and disease states. Performed at [facility], [address] [place], [place] [address]   APTT     Status: Abnormal   Collection Time: 09/08/19  3:39 AM  Result Value Ref Range   aPTT 83 (H) 24 - 36 seconds    Comment:        IF BASELINE aPTT IS ELEVATED, SUGGEST PATIENT RISK ASSESSMENT BE USED TO DETERMINE APPROPRIATE ANTICOAGULANT THERAPY. Performed at [facility], [address] [place], [place] [address]   Hemoglobin A1c     Status: None   Collection Time: 09/08/19  3:39 AM  Result Value Ref Range   Hgb A1c MFr Bld 5.2 4.8 - 5.6 %    Comment: (NOTE) Pre diabetes:  5.7%-6.4%  Diabetes:              >6.4%  Glycemic control for   <7.0% adults with diabetes    Mean Plasma Glucose 102.54 mg/dL    Comment: Performed at [facility] [address], [place], [place] [address]  Troponin I (High Sensitivity)     Status: Abnormal   Collection Time: 09/08/19  3:39 AM  Result Value Ref Range   Troponin I (High Sensitivity) 12,764 (HH) <18 ng/L    Comment: CRITICAL VALUE NOTED. VALUE IS CONSISTENT WITH PREVIOUSLY REPORTED/CALLED VALUE HNM (NOTE) Elevated high sensitivity troponin I (hsTnI) values and significant  changes across serial measurements may suggest ACS but many other  chronic and acute conditions are known to elevate hsTnI results.  Refer to the "Links" section for chest pain algorithms and additional  guidance. Performed at [facility], [address] [place], [place] [address]   Lactic acid, plasma     Status: None   Collection Time: 09/08/19  3:39 AM  Result Value Ref Range   Lactic Acid, Venous 1.8 0.5 - 1.9 mmol/L    Comment: Performed at [facility], [address] [place], [place] [address]  Respiratory Panel by PCR     Status: None   Collection Time: 09/08/19  4:00 AM   Specimen: Nasopharyngeal Swab; Respiratory  Result Value Ref Range   Adenovirus NOT DETECTED NOT DETECTED   Coronavirus 229E NOT DETECTED NOT DETECTED    Comment: (NOTE) The Coronavirus on the Respiratory Panel, DOES NOT test for the novel  Coronavirus (2019 nCoV)    Coronavirus HKU1 NOT DETECTED NOT DETECTED   Coronavirus NL63 NOT DETECTED NOT DETECTED   Coronavirus OC43 NOT DETECTED NOT DETECTED   Metapneumovirus NOT DETECTED NOT DETECTED   Rhinovirus / Enterovirus NOT DETECTED NOT DETECTED   Influenza A NOT DETECTED NOT DETECTED   Influenza B NOT DETECTED NOT DETECTED   Parainfluenza Virus 1 NOT DETECTED NOT DETECTED   Parainfluenza Virus 2 NOT DETECTED NOT DETECTED   Parainfluenza Virus 3 NOT DETECTED NOT DETECTED   Parainfluenza Virus 4 NOT DETECTED NOT DETECTED   Respiratory Syncytial Virus NOT DETECTED NOT DETECTED   Bordetella pertussis NOT DETECTED NOT DETECTED   Chlamydophila pneumoniae NOT DETECTED NOT DETECTED   Mycoplasma pneumoniae NOT DETECTED NOT DETECTED    Comment: Performed at [facility], [address], [place], [place] [address]  Glucose, capillary     Status: None   Collection Time: 09/08/19  7:17 AM  Result Value Ref Range   Glucose-Capillary 89 70 - 99 mg/dL    Comment: Glucose reference range applies only to samples taken after fasting for at least 8 hours.  Basic metabolic panel     Status: Abnormal   Collection Time: 09/08/19 11:15 AM  Result Value Ref Range   Sodium 138 135 - 145 mmol/L   Potassium 3.8 3.5 - 5.1 mmol/L   Chloride 109 98 - 111 mmol/L   CO2 16 (L) 22 - 32 mmol/L    Glucose, Bld 130 (H) 70 - 99 mg/dL    Comment: Glucose reference range applies only to samples taken after fasting for at least 8 hours.   BUN 40 (H) 8 - 23 mg/dL   Creatinine, Ser 1.92 (H) 0.61 - 1.24 mg/dL   Calcium 7.7 (L) 8.9 - 10.3 mg/dL   GFR calc non Af Amer 36 (L) >60 mL/min   GFR calc Af Amer 41 (L) >  60 mL/min   Anion gap 13 5 - 15    Comment: Performed at [facility], [place], [place] [address]  Lactic acid, plasma     Status: None   Collection Time: 09/08/19 11:15 AM  Result Value Ref Range   Lactic Acid, Venous 1.4 0.5 - 1.9 mmol/L    Comment: Performed at [facility], [place], [place] [address]  Troponin I (High Sensitivity)     Status: Abnormal   Collection Time: 09/08/19 11:15 AM  Result Value Ref Range   Troponin I (High Sensitivity) 9,141 (HH) <18 ng/L    Comment: CRITICAL VALUE NOTED. VALUE IS CONSISTENT WITH PREVIOUSLY REPORTED/CALLED VALUE DAS (NOTE) Elevated high sensitivity troponin I (hsTnI) values and significant  changes across serial measurements may suggest ACS but many other  chronic and acute conditions are known to elevate hsTnI results.  Refer to the "Links" section for chest pain algorithms and additional  guidance. Performed at [facility], [address] [place], [place] [address]   Glucose, capillary     Status: Abnormal   Collection Time: 09/08/19 11:33 AM  Result Value Ref Range   Glucose-Capillary 117 (H) 70 - 99 mg/dL    Comment: Glucose reference range applies only to samples taken after fasting for at least 8 hours.  Heparin level (unfractionated)     Status: Abnormal   Collection Time: 09/08/19  1:24 PM  Result Value Ref Range   Heparin Unfractionated 0.27 (L) 0.30 - 0.70 IU/mL    Comment: (NOTE) If heparin results are below expected values, and patient dosage has  been confirmed, suggest follow up testing of antithrombin III levels. Performed at [facility],  [address] [place], [place] [address]   Glucose, capillary     Status: Abnormal   Collection Time: 09/08/19  4:13 PM  Result Value Ref Range   Glucose-Capillary 106 (H) 70 - 99 mg/dL    Comment: Glucose reference range applies only to samples taken after fasting for at least 8 hours.  Glucose, capillary     Status: Abnormal   Collection Time: 09/08/19  7:32 PM  Result Value Ref Range   Glucose-Capillary 104 (H) 70 - 99 mg/dL    Comment: Glucose reference range applies only to samples taken after fasting for at least 8 hours.  Heparin level (unfractionated)     Status: None   Collection Time: 09/08/19  9:00 PM  Result Value Ref Range   Heparin Unfractionated 0.51 0.30 - 0.70 IU/mL    Comment: (NOTE) If heparin results are below expected values, and patient dosage has  been confirmed, suggest follow up testing of antithrombin III levels. Performed at [facility], [address] [place], [place] [address]   Glucose, capillary     Status: Abnormal   Collection Time: 09/08/19 11:54 PM  Result Value Ref Range   Glucose-Capillary 121 (H) 70 - 99 mg/dL    Comment: Glucose reference range applies only to samples taken after fasting for at least 8 hours.  Glucose, capillary     Status: Abnormal   Collection Time: 2019-09-17  4:09 AM  Result Value Ref Range   Glucose-Capillary 127 (H) 70 - 99 mg/dL    Comment: Glucose reference range applies only to samples taken after fasting for at least 8 hours.  Procalcitonin     Status: None   Collection Time: 09-17-19  4:18 AM  Result Value Ref Range   Procalcitonin 10.75  ng/mL    Comment:        Interpretation: PCT >= 10 ng/mL: Important systemic inflammatory response, almost exclusively due to severe bacterial sepsis or septic shock. (NOTE)       Sepsis PCT Algorithm           Lower Respiratory Tract                                      Infection PCT Algorithm    ----------------------------      ----------------------------         PCT < 0.25 ng/mL                PCT < 0.10 ng/mL          Strongly encourage             Strongly discourage   discontinuation of antibiotics    initiation of antibiotics    ----------------------------     -----------------------------       PCT 0.25 - 0.50 ng/mL            PCT 0.10 - 0.25 ng/mL               OR       >80% decrease in PCT            Discourage initiation of                                            antibiotics      Encourage discontinuation           of antibiotics    ----------------------------     -----------------------------         PCT >= 0.50 ng/mL              PCT 0.26 - 0.50 ng/mL                AND       <80% decrease in PCT             Encourage initiation of                                             antibiotics       Encourage continuation           of antibiotics    ----------------------------     -----------------------------        PCT >= 0.50 ng/mL                  PCT > 0.50 ng/mL               AND         increase in PCT                  Strongly encourage                                      initiation of antibiotics    Strongly encourage escalation           of antibiotics                                     -----------------------------  PCT <= 0.25 ng/mL                                                 OR                                        > 80% decrease in PCT                                      Discontinue / Do not initiate                                             antibiotics  Performed at [facility], [address] [place], [place] [address]   CBC with Differential/Platelet     Status: Abnormal   Collection Time: 09-25-19  4:18 AM  Result Value Ref Range   WBC 12.9 (H) 4.0 - 10.5 K/uL   RBC 3.22 (L) 4.22 - 5.81 MIL/uL   Hemoglobin 9.7 (L) 13.0 - 17.0 g/dL   HCT 30.3 (L) 39 - 52 %   MCV 94.1 80.0 - 100.0 fL   MCH 30.1 26.0 - 34.0  pg   MCHC 32.0 30.0 - 36.0 g/dL   RDW 14.6 11.5 - 15.5 %   Platelets 337 150 - 400 K/uL   nRBC 0.0 0.0 - 0.2 %   Neutrophils Relative % 70 %   Neutro Abs 9.1 (H) 1.7 - 7.7 K/uL   Lymphocytes Relative 23 %   Lymphs Abs 3.0 0.7 - 4.0 K/uL   Monocytes Relative 5 %   Monocytes Absolute 0.7 0 - 1 K/uL   Eosinophils Relative 1 %   Eosinophils Absolute 0.1 0 - 0 K/uL   Basophils Relative 0 %   Basophils Absolute 0.0 0 - 0 K/uL   Immature Granulocytes 1 %   Abs Immature Granulocytes 0.08 (H) 0.00 - 0.07 K/uL    Comment: Performed at [facility], [address], [place], [place] [address]  Comprehensive metabolic panel     Status: Abnormal   Collection Time: 2019-09-25  4:18 AM  Result Value Ref Range   Sodium 140 135 - 145 mmol/L   Potassium 3.4 (L) 3.5 - 5.1 mmol/L   Chloride 114 (H) 98 - 111 mmol/L   CO2 18 (L) 22 - 32 mmol/L   Glucose, Bld 136 (H) 70 - 99 mg/dL    Comment: Glucose reference range applies only to samples taken after fasting for at least 8 hours.   BUN 32 (H) 8 - 23 mg/dL   Creatinine, Ser 1.69 (H) 0.61 - 1.24 mg/dL   Calcium 7.9 (L) 8.9 - 10.3 mg/dL   Total Protein 5.6 (L) 6.5 - 8.1 g/dL   Albumin 2.6 (L) 3.5 - 5.0 g/dL   AST 28 15 - 41 U/L   ALT 26 0 - 44 U/L   Alkaline Phosphatase 65 38 - 126 U/L   Total Bilirubin 0.7 0.3 - 1.2 mg/dL   GFR calc non Af Amer 42 (L) >60 mL/min   GFR calc Af  Amer 48 (L) >60 mL/min   Anion gap 8 5 - 15    Comment: Performed at [facility], [address] [place], [place] [address]  Triglycerides     Status: Abnormal   Collection Time: 09/18/2019  4:18 AM  Result Value Ref Range   Triglycerides 400 (H) <150 mg/dL    Comment: Performed at [facility], [place], [place] [address]  Heparin level (unfractionated)     Status: None   Collection Time: 18-Sep-2019  4:18 AM  Result Value Ref Range   Heparin Unfractionated 0.58 0.30 - 0.70 IU/mL    Comment: (NOTE) If heparin results are below  expected values, and patient dosage has  been confirmed, suggest follow up testing of antithrombin III levels. Performed at [facility], [address] [place], [place] [address]   Phosphorus     Status: None   Collection Time: September 18, 2019  4:18 AM  Result Value Ref Range   Phosphorus 2.7 2.5 - 4.6 mg/dL    Comment: Performed at [facility], [address] [place], [place] [address]  Magnesium     Status: None   Collection Time: 2019/09/18  4:18 AM  Result Value Ref Range   Magnesium 2.1 1.7 - 2.4 mg/dL    Comment: Performed at [facility] [place], [address] [place], [place] [address]  Glucose, capillary     Status: None   Collection Time: 09-18-2019  7:39 AM  Result Value Ref Range   Glucose-Capillary 95 70 - 99 mg/dL    Comment: Glucose reference range applies only to samples taken after fasting for at least 8 hours.    Recent Results (from the past 240 hour(s))  SARS Coronavirus 2 by RT PCR (hospital order, performed in [facility] hospital lab) Nasopharyngeal Nasopharyngeal Swab     Status: None   Collection Time: 09/14/2019 11:47 PM   Specimen: Nasopharyngeal Swab  Result Value Ref Range Status   SARS Coronavirus 2 NEGATIVE NEGATIVE Final    Comment: (NOTE) SARS-CoV-2 target nucleic acids are NOT DETECTED.  The SARS-CoV-2 RNA is generally detectable in upper and lower respiratory specimens during the acute phase of infection. The lowest concentration of SARS-CoV-2 viral copies this assay can detect is 250 copies / mL. A negative result does not preclude SARS-CoV-2 infection and should not be used as the sole basis for treatment or other patient management decisions.  A negative result may occur with improper specimen collection / handling, submission of specimen other than nasopharyngeal swab, presence of viral mutation(s) within the areas targeted by this assay, and inadequate number of viral copies (<250 copies / mL). A negative result must be  combined with clinical observations, patient history, and epidemiological information.  Fact Sheet for Patients:   StrictlyIdeas.no  Fact Sheet for Healthcare Providers: BankingDealers.co.za  This test is not yet approved or  cleared by the [place] FDA and has been authorized for detection and/or diagnosis of SARS-CoV-2 by FDA under an Emergency Use Authorization (EUA).  This EUA will remain in effect (meaning this test can be used) for the duration of the COVID-19 declaration under Section 564(b)(1) of the Act, 21 U.S.C. section 360bbb-3(b)(1), unless the authorization is terminated or revoked sooner.  Performed at [facility], [place], [place] [address]   CULTURE, BLOOD (ROUTINE X 2) w Reflex to ID Panel     Status: None (Preliminary result)   Collection Time: 09/08/19  1:14 AM  Specimen: BLOOD  Result Value Ref Range Status   Specimen Description BLOOD LT FOREARM  Final   Special Requests   Final    BOTTLES DRAWN AEROBIC AND ANAEROBIC Blood Culture adequate volume   Culture   Final    NO GROWTH < 12 HOURS Performed at [facility], [address] [place], [place] [address]    Report Status PENDING  Incomplete  CULTURE, BLOOD (ROUTINE X 2) w Reflex to ID Panel     Status: None (Preliminary result)   Collection Time: 09/08/19  1:14 AM   Specimen: BLOOD  Result Value Ref Range Status   Specimen Description BLOOD RAC  Final   Special Requests   Final    BOTTLES DRAWN AEROBIC AND ANAEROBIC Blood Culture adequate volume   Culture   Final    NO GROWTH < 12 HOURS Performed at [facility], [address], [place], [place] [address]    Report Status PENDING  Incomplete  MRSA PCR Screening     Status: None   Collection Time: 09/08/19  2:44 AM   Specimen: Nasopharyngeal  Result Value Ref Range Status   MRSA by PCR NEGATIVE NEGATIVE Final    Comment:        The GeneXpert MRSA  Assay (FDA approved for NASAL specimens only), is one component of a comprehensive MRSA colonization surveillance program. It is not intended to diagnose MRSA infection nor to guide or monitor treatment for MRSA infections. Performed at [facility], [address] [place], [place] [address]   Respiratory Panel by PCR     Status: None   Collection Time: 09/08/19  4:00 AM   Specimen: Nasopharyngeal Swab; Respiratory  Result Value Ref Range Status   Adenovirus NOT DETECTED NOT DETECTED Final   Coronavirus 229E NOT DETECTED NOT DETECTED Final    Comment: (NOTE) The Coronavirus on the Respiratory Panel, DOES NOT test for the novel  Coronavirus (2019 nCoV)    Coronavirus HKU1 NOT DETECTED NOT DETECTED Final   Coronavirus NL63 NOT DETECTED NOT DETECTED Final   Coronavirus OC43 NOT DETECTED NOT DETECTED Final   Metapneumovirus NOT DETECTED NOT DETECTED Final   Rhinovirus / Enterovirus NOT DETECTED NOT DETECTED Final   Influenza A NOT DETECTED NOT DETECTED Final   Influenza B NOT DETECTED NOT DETECTED Final   Parainfluenza Virus 1 NOT DETECTED NOT DETECTED Final   Parainfluenza Virus 2 NOT DETECTED NOT DETECTED Final   Parainfluenza Virus 3 NOT DETECTED NOT DETECTED Final   Parainfluenza Virus 4 NOT DETECTED NOT DETECTED Final   Respiratory Syncytial Virus NOT DETECTED NOT DETECTED Final   Bordetella pertussis NOT DETECTED NOT DETECTED Final   Chlamydophila pneumoniae NOT DETECTED NOT DETECTED Final   Mycoplasma pneumoniae NOT DETECTED NOT DETECTED Final    Comment: Performed at [facility], [place]. [address] [place],  [address]    Lipid Panel Recent Labs    Sep 23, 2019 0418  TRIG 400*    Studies/Results: DG Abdomen 1 View  Result Date: 09/08/2019 CLINICAL DATA:  Orogastric tube placement EXAM: ABDOMEN - 1 VIEW COMPARISON:  None. FINDINGS: Tip of the orogastric tube is in the stomach. The side port is just below the gastroesophageal junction. Recommend  advancing 5 cm. Large amount of stool in the left colon. IMPRESSION: Orogastric tube side port just below the gastroesophageal junction. Recommend advancing 5 cm. Electronically Signed   By: Ulyses Jarred M.D.   On: 09/08/2019 00:36   CT Head Wo Contrast  Result Date: 09/08/2019 CLINICAL DATA:  Mental status changes EXAM: CT HEAD WITHOUT CONTRAST TECHNIQUE: Contiguous axial images were obtained from the base of the skull through the vertex without intravenous contrast. COMPARISON:  10/29/2007 FINDINGS: Brain: There is atrophy and chronic small vessel disease changes. No acute intracranial abnormality. Specifically, no hemorrhage, hydrocephalus, mass lesion, acute infarction, or significant intracranial injury. Vascular: No hyperdense vessel or unexpected calcification. Skull: No acute calvarial abnormality. Sinuses/Orbits: Visualized paranasal sinuses and mastoids clear. Orbital soft tissues unremarkable. Other: None IMPRESSION: Atrophy, chronic microvascular disease. No acute intracranial abnormality. Electronically Signed   By: Rolm Baptise M.D.   On: 09/08/2019 00:27   DG Chest Portable 1 View  Result Date: 09/08/2019 CLINICAL DATA:  Intubated EXAM: PORTABLE CHEST 1 VIEW COMPARISON:  05/15/2019 FINDINGS: Endotracheal tube is 1.6 cm above the carina. NG tube is in the stomach. Heart is upper limits normal in size. Diffuse bilateral airspace disease. No effusions. No acute bony abnormality. IMPRESSION: Support devices as above. Diffuse bilateral airspace disease, likely infection. Electronically Signed   By: Rolm Baptise M.D.   On: 09/08/2019 00:36   EEG adult  Result Date: 09/08/2019 Lora Havens, MD     09/08/2019  4:02 PM Patient Name: Michael Ramsey MRN: [id number] Epilepsy Attending: Lora Havens Referring Physician/Provider: Darel Hong, NP Date: 09/08/2019 Duration: 24.46 minutes Patient history: 63 year old male status post cardiac arrest and noted to have seizure-like episodes.  EEG to  evaluate for seizures. Level of alertness: Comatose AEDs during EEG study: LEV, Propofol, Versed Technical aspects: This EEG study was done with scalp electrodes positioned according to the 10-20 International system of electrode placement. Electrical activity was acquired at a sampling rate of _0  and reviewed with a high frequency filter of _1  and a low frequency filter of _2 . EEG data were recorded continuously and digitally stored. Description: Patient was noted to have multiple episodes of generalized axial twitching.  Concomitant EEG showed generalized high amplitude polyspikes consistent with myoclonic seizures.  EEG also showed burst suppression pattern with EEG suppression lasting 3 to 5 seconds as well as generalized highly epileptiform bursts lasting 2 to 3 seconds. Hyperventilation and photic stimulation were not performed.   ABNORMALITY -Myoclonic seizures, generalized -Burst suppression with highly epileptiform bursts, generalized IMPRESSION: This study showed multiple myoclonic seizures as well as profound diffuse encephalopathy suggestive of diffuse anoxic/hypoxic brain injury. Dr. Kerney Elbe was notified. Lora Havens   ECHOCARDIOGRAM COMPLETE  Result Date: 09/08/2019    ECHOCARDIOGRAM REPORT   Patient Name:   Michael Ramsey Date of Exam: 09/08/2019 Medical Rec #:  [id number]    Height:       68.0 in Accession #:    [id number]   Weight:       159.8 lb Date of Birth:  27-Sep-1955    BSA:          1.858 m Patient Age:    20 years     BP:           97/58 mmHg Patient Gender: M            HR:           58 bpm. Exam Location:  [facility] Procedure: 2D Echo, Color Doppler, Cardiac Doppler and Intracardiac            Opacification Agent Indications:     I46.9 Cardiac arrest  History:         Patient has no prior history of Echocardiogram examinations.  CKD; Risk Factors:Hypertension.  Sonographer:     Charmayne Sheer RDCS (AE) Referring Phys:  [id number] Bradly Bienenstock Diagnosing Phys:  Nelva Bush MD  Sonographer Comments: Echo performed with patient supine and on artificial respirator. IMPRESSIONS  1. Left ventricular ejection fraction, by estimation, is 25 to 30%. The left ventricle has severely decreased function. The left ventricle demonstrates regional wall motion abnormalities (see scoring diagram/findings for description). Left ventricular diastolic parameters are consistent with Grade II diastolic dysfunction (pseudonormalization). Elevated left atrial pressure. There is akinesis of the left ventricular, mid-apical anteroseptal wall, anterior wall, anterolateral wall, inferior wall and apical segment.  2. Right ventricular systolic function is normal. The right ventricular size is normal.  3. Right atrial size was mildly dilated.  4. The mitral valve is normal in structure. Mild mitral valve regurgitation. No evidence of mitral stenosis.  5. Unable to determine valve morphology due to image quality. Aortic valve regurgitation is not visualized. No aortic stenosis is present. FINDINGS  Left Ventricle: Left ventricular ejection fraction, by estimation, is 25 to 30%. The left ventricle has severely decreased function. The left ventricle demonstrates regional wall motion abnormalities. Definity contrast agent was given IV to delineate the left ventricular endocardial borders. The left ventricular internal cavity size was normal in size. There is no left ventricular hypertrophy. Left ventricular diastolic parameters are consistent with Grade II diastolic dysfunction (pseudonormalization). Elevated left atrial pressure. Right Ventricle: Pulmonary artery pressure is at least mildly elevated (RVSP 35-40 mmHg plus central venous pressure). The right ventricular size is normal. No increase in right ventricular wall thickness. Right ventricular systolic function is normal. Left Atrium: Left atrial size was normal in size. Right Atrium: Right atrial size was mildly dilated. Pericardium: There is  no evidence of pericardial effusion. Mitral Valve: The mitral valve is normal in structure. Mild mitral valve regurgitation. No evidence of mitral valve stenosis. MV peak gradient, 5.6 mmHg. The mean mitral valve gradient is 2.0 mmHg. Tricuspid Valve: The tricuspid valve is normal in structure. Tricuspid valve regurgitation is trivial. Aortic Valve: Unable to determine valve morphology due to image quality. Aortic valve regurgitation is not visualized. No aortic stenosis is present. Aortic valve mean gradient measures 6.0 mmHg. Aortic valve peak gradient measures 11.3 mmHg. Aortic valve area, by VTI measures 1.49 cm. Pulmonic Valve: The pulmonic valve was grossly normal. Pulmonic valve regurgitation is not visualized. No evidence of pulmonic stenosis. Aorta: The aortic root is normal in size and structure. Pulmonary Artery: The pulmonary artery is of normal size. Venous: IVC assessment for right atrial pressure unable to be performed due to mechanical ventilation. IAS/Shunts: The interatrial septum was not well visualized.  LEFT VENTRICLE PLAX 2D LVIDd:         5.11 cm  Diastology LVIDs:         2.94 cm  LV e' lateral:   9.36 cm/s LV PW:         1.02 cm  LV E/e' lateral: 10.7 LV IVS:        0.96 cm  LV e' medial:    4.68 cm/s LVOT diam:     1.90 cm  LV E/e' medial:  21.5 LV SV:         47 LV SV Index:   25 LVOT Area:     2.84 cm  LEFT ATRIUM             Index LA diam:        3.80 cm 2.05 cm/m LA Vol (A2C):  37.6 ml 20.24 ml/m LA Vol (A4C):   44.7 ml 24.06 ml/m LA Biplane Vol: 41.6 ml 22.39 ml/m  AORTIC VALVE                    PULMONIC VALVE AV Area (Vmax):    1.26 cm     PV Vmax:       0.88 m/s AV Area (Vmean):   1.23 cm     PV Vmean:      60.300 cm/s AV Area (VTI):     1.49 cm     PV VTI:        0.196 m AV Vmax:           168.00 cm/s  PV Peak grad:  3.1 mmHg AV Vmean:          110.000 cm/s PV Mean grad:  2.0 mmHg AV VTI:            0.315 m AV Peak Grad:      11.3 mmHg AV Mean Grad:      6.0 mmHg LVOT  Vmax:         74.60 cm/s LVOT Vmean:        47.700 cm/s LVOT VTI:          0.166 m LVOT/AV VTI ratio: 0.53  AORTA Ao Root diam: 2.40 cm MITRAL VALVE                TRICUSPID VALVE MV Area (PHT): 3.34 cm     TR Peak grad:   37.2 mmHg MV Peak grad:  5.6 mmHg     TR Vmax:        305.00 cm/s MV Mean grad:  2.0 mmHg MV Vmax:       1.18 m/s     SHUNTS MV Vmean:      55.3 cm/s    Systemic VTI:  0.17 m MV Decel Time: 227 msec     Systemic Diam: 1.90 cm MV E velocity: 100.50 cm/s MV A velocity: 46.80 cm/s MV E/A ratio:  2.15 Harrell Gave End MD Electronically signed by Nelva Bush MD Signature Date/Time: 09/08/2019/6:41:08 PM    Final     Medications:  Scheduled: . aspirin  81 mg Per Tube Daily  . chlorhexidine gluconate (MEDLINE KIT)  15 mL Mouth Rinse BID  . Chlorhexidine Gluconate Cloth  6 each Topical Q0600  . insulin aspart  0-15 Units Subcutaneous Q4H  . mouth rinse  15 mL Mouth Rinse 10 times per day   Continuous: . sodium chloride 5 mL/hr at October 01, 2019 0800  . ampicillin-sulbactam (UNASYN) IV Stopped (2019-10-01 0436)  . famotidine (PEPCID) IV Stopped (09/08/19 0155)  . fentaNYL infusion INTRAVENOUS 100 mcg/hr (10-01-2019 0800)  . heparin 1,200 Units/hr (2019-10-01 0800)  . lactated ringers 75 mL/hr at Oct 01, 2019 0800  . levETIRAcetam Stopped (09/08/19 2144)  . norepinephrine (LEVOPHED) Adult infusion 10 mcg/min (October 01, 2019 0800)  . propofol (DIPRIVAN) infusion 80 mcg/kg/min (2019/10/01 0821)  . valproate sodium Stopped (10-01-19 0709)    EEG (9/7): EEG shows generalized myoclonic seizures in the context of burst suppression with generalized highly epileptiform bursts. EEG findings of multiple myoclonic seizures as well as profound diffuse encephalopathy are suggestive of diffuse anoxic/hypoxic brain injury.  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  1. Exam 40 minutes after propofol and fentanyl were held reveals a comatose patient with abdominal and  distal BLE myoclonus, in addition to absent brainstem reflexes.  2. Exam  findings best localize as diffuse cerebral dysfunction. Most likely etiology is severe diffuse anoxic brain injury.  3. CT head on 9/7 showed no acute abnormality 4. MRI brain has not yet been obtained.  5. Clinically evident myoclonus has been well-controlled on propofol gtt at a rate of 80. However, electrographic myoclonus was still present on EEG yesterday while on propofol gtt, therefore valproic acid was started yesterday with a load followed by scheduled dosing of 5 mg IV TID.  6. The patient has been rewarmed.   Recommendations: 1. Continue Keppra at 1000 mg BID. Cannot increase further due to impaired renal function.  2. Continue VPA at scheduled dosing of 5 mg IV TID.  3. Obtain MRI brain to assist with prognostication 4. Repeat EEG this AM (ordered)  5. Prognosis likely to be poor given myoclonus occurring within a short time frame following cardiac arrest. Discussed poor prognosis with family by telephone yesterday   40 minutes spent in the neurological evaluation and management of this critically ill patient.     LOS: 1 day   _0  signed: Dr. Kerney Elbe 09/24/19  8:41 AM

## 2019-10-02 NOTE — Progress Notes (Addendum)
Shift summary:  - Patient remains intubated and sedated this AM.  - [facility] contacted by this RN this AM to evaluate donation candidacy. Call back received from [facility] with HB; at this time patient is not a candidate for DCD or donation. RN to return call with cardiac time of death, or if patient no longer exhibits myoclonus.    - Patient extubated at 1224 hrs.   - Time of Death 12:41 PM today. Pronounced by Harriett Sine, RN and Lu Duffel, RN.

## 2019-10-02 NOTE — Progress Notes (Signed)
CRITICAL CARE NOTE  CC  follow up respiratory failure  SUBJECTIVE Patient remains critically ill Prognosis is guarded Severe cardiac arrest and resp failure +brain damage from anoxia Multiorgan failure Patient is in dying process   BP 103/63   Pulse 65   Temp 98.2 F (36.8 C)   Resp 20   Ht 5\' 8"  (1.727 m)   Wt 72.5 kg   SpO2 100%   BMI 24.30 kg/m    I/O last 3 completed shifts: In: 5632.5 [I.V.:4320.4; IV Piggyback:1312.1] Out: 2134 [PRFFM:3846; Emesis/NG output:150] No intake/output data recorded.  SpO2: 100 % FiO2 (%): 40 %  Estimated body mass index is 24.3 kg/m as calculated from the following:   Height as of this encounter: 5\' 8"  (1.727 m).   Weight as of this encounter: 72.5 kg.  SIGNIFICANT EVENTS   REVIEW OF SYSTEMS  PATIENT IS UNABLE TO PROVIDE COMPLETE REVIEW OF SYSTEMS DUE TO SEVERE CRITICAL ILLNESS        PHYSICAL EXAMINATION:  GENERAL:critically ill appearing, +resp distress HEAD: Normocephalic, atraumatic.  EYES: Pupils equal, round, reactive to light.  No scleral icterus.  MOUTH: Moist mucosal membrane. NECK: Supple.  PULMONARY: +rhonchi, +wheezing CARDIOVASCULAR: S1 and S2. Regular rate and rhythm. No murmurs, rubs, or gallops.  GASTROINTESTINAL: Soft, nontender, -distended.  Positive bowel sounds.   MUSCULOSKELETAL: No swelling, clubbing, or edema.  NEUROLOGIC: obtunded, GCS<8 +myoclonus SKIN:intact,warm,dry  MEDICATIONS: I have reviewed all medications and confirmed regimen as documented   CULTURE RESULTS   Recent Results (from the past 240 hour(s))  SARS Coronavirus 2 by RT PCR (hospital order, performed in [facility] hospital lab) Nasopharyngeal Nasopharyngeal Swab     Status: None   Collection Time: 09/02/2019 11:47 PM   Specimen: Nasopharyngeal Swab  Result Value Ref Range Status   SARS Coronavirus 2 NEGATIVE NEGATIVE Final    Comment: (NOTE) SARS-CoV-2 target nucleic acids are NOT DETECTED.  The SARS-CoV-2 RNA is  generally detectable in upper and lower respiratory specimens during the acute phase of infection. The lowest concentration of SARS-CoV-2 viral copies this assay can detect is 250 copies / mL. A negative result does not preclude SARS-CoV-2 infection and should not be used as the sole basis for treatment or other patient management decisions.  A negative result may occur with improper specimen collection / handling, submission of specimen other than nasopharyngeal swab, presence of viral mutation(s) within the areas targeted by this assay, and inadequate number of viral copies (<250 copies / mL). A negative result must be combined with clinical observations, patient history, and epidemiological information.  Fact Sheet for Patients:   StrictlyIdeas.no  Fact Sheet for Healthcare Providers: BankingDealers.co.za  This test is not yet approved or  cleared by the [place] FDA and has been authorized for detection and/or diagnosis of SARS-CoV-2 by FDA under an Emergency Use Authorization (EUA).  This EUA will remain in effect (meaning this test can be used) for the duration of the COVID-19 declaration under Section 564(b)(1) of the Act, 21 U.S.C. section 360bbb-3(b)(1), unless the authorization is terminated or revoked sooner.  Performed at [facility], [address] [place], [place] [address]   CULTURE, BLOOD (ROUTINE X 2) w Reflex to ID Panel     Status: None (Preliminary result)   Collection Time: 09/08/19  1:14 AM   Specimen: BLOOD  Result Value Ref Range Status   Specimen Description BLOOD LT FOREARM  Final   Special Requests   Final    BOTTLES DRAWN AEROBIC AND  ANAEROBIC Blood Culture adequate volume   Culture   Final    NO GROWTH < 12 HOURS Performed at [facility], [address] [place], [place] [address]    Report Status PENDING  Incomplete  CULTURE, BLOOD (ROUTINE X 2) w Reflex to ID Panel      Status: None (Preliminary result)   Collection Time: 09/08/19  1:14 AM   Specimen: BLOOD  Result Value Ref Range Status   Specimen Description BLOOD RAC  Final   Special Requests   Final    BOTTLES DRAWN AEROBIC AND ANAEROBIC Blood Culture adequate volume   Culture   Final    NO GROWTH < 12 HOURS Performed at [facility], [address] [place], [place] [address]    Report Status PENDING  Incomplete  MRSA PCR Screening     Status: None   Collection Time: 09/08/19  2:44 AM   Specimen: Nasopharyngeal  Result Value Ref Range Status   MRSA by PCR NEGATIVE NEGATIVE Final    Comment:        The GeneXpert MRSA Assay (FDA approved for NASAL specimens only), is one component of a comprehensive MRSA colonization surveillance program. It is not intended to diagnose MRSA infection nor to guide or monitor treatment for MRSA infections. Performed at [facility], [address] [place], [place] [address]   Respiratory Panel by PCR     Status: None   Collection Time: 09/08/19  4:00 AM   Specimen: Nasopharyngeal Swab; Respiratory  Result Value Ref Range Status   Adenovirus NOT DETECTED NOT DETECTED Final   Coronavirus 229E NOT DETECTED NOT DETECTED Final    Comment: (NOTE) The Coronavirus on the Respiratory Panel, DOES NOT test for the novel  Coronavirus (2019 nCoV)    Coronavirus HKU1 NOT DETECTED NOT DETECTED Final   Coronavirus NL63 NOT DETECTED NOT DETECTED Final   Coronavirus OC43 NOT DETECTED NOT DETECTED Final   Metapneumovirus NOT DETECTED NOT DETECTED Final   Rhinovirus / Enterovirus NOT DETECTED NOT DETECTED Final   Influenza A NOT DETECTED NOT DETECTED Final   Influenza B NOT DETECTED NOT DETECTED Final   Parainfluenza Virus 1 NOT DETECTED NOT DETECTED Final   Parainfluenza Virus 2 NOT DETECTED NOT DETECTED Final   Parainfluenza Virus 3 NOT DETECTED NOT DETECTED Final   Parainfluenza Virus 4 NOT DETECTED NOT DETECTED Final   Respiratory Syncytial  Virus NOT DETECTED NOT DETECTED Final   Bordetella pertussis NOT DETECTED NOT DETECTED Final   Chlamydophila pneumoniae NOT DETECTED NOT DETECTED Final   Mycoplasma pneumoniae NOT DETECTED NOT DETECTED Final    Comment: Performed at [facility], [place]. [address], [place], [place] [address]          IMAGING    EEG adult  Result Date: 09/08/2019 Michael Havens, MD     09/08/2019  4:02 PM Patient Name: Michael Ramsey MRN: [id number] Epilepsy Attending: Lora Ramsey Referring Physician/Provider: Darel Hong, NP Date: 09/08/2019 Duration: 24.46 minutes Patient history: 64 year old male status post cardiac arrest and noted to have seizure-like episodes.  EEG to evaluate for seizures. Level of alertness: Comatose AEDs during EEG study: LEV, Propofol, Versed Technical aspects: This EEG study was done with scalp electrodes positioned according to the 10-20 International system of electrode placement. Electrical activity was acquired at a sampling rate of 500Hz  and reviewed with a high frequency filter of 70Hz  and a low frequency filter of 1Hz . EEG data were recorded continuously and digitally stored.  Description: Patient was noted to have multiple episodes of generalized axial twitching.  Concomitant EEG showed generalized high amplitude polyspikes consistent with myoclonic seizures.  EEG also showed burst suppression pattern with EEG suppression lasting 3 to 5 seconds as well as generalized highly epileptiform bursts lasting 2 to 3 seconds. Hyperventilation and photic stimulation were not performed.   ABNORMALITY -Myoclonic seizures, generalized -Burst suppression with highly epileptiform bursts, generalized IMPRESSION: This study showed multiple myoclonic seizures as well as profound diffuse encephalopathy suggestive of diffuse anoxic/hypoxic brain injury. Dr. Kerney Elbe was notified. Michael Ramsey   ECHOCARDIOGRAM COMPLETE  Result Date: 09/08/2019    ECHOCARDIOGRAM REPORT   Patient Name:    Michael Ramsey Date of Exam: 09/08/2019 Medical Rec #:  [id number]    Height:       68.0 in Accession #:    [id number]   Weight:       159.8 lb Date of Birth:  [birth year]    BSA:          1.858 m Patient Age:    64 years     BP:           97/58 mmHg Patient Gender: M            HR:           58 bpm. Exam Location:  [facility] Procedure: 2D Echo, Color Doppler, Cardiac Doppler and Intracardiac            Opacification Agent Indications:     I46.9 Cardiac arrest  History:         Patient has no prior history of Echocardiogram examinations.                  CKD; Risk Factors:Hypertension.  Sonographer:     Charmayne Sheer RDCS (AE) Referring Phys:  [id number] Bradly Bienenstock Diagnosing Phys: Nelva Bush MD  Sonographer Comments: Echo performed with patient supine and on artificial respirator. IMPRESSIONS  1. Left ventricular ejection fraction, by estimation, is 25 to 30%. The left ventricle has severely decreased function. The left ventricle demonstrates regional wall motion abnormalities (see scoring diagram/findings for description). Left ventricular diastolic parameters are consistent with Grade II diastolic dysfunction (pseudonormalization). Elevated left atrial pressure. There is akinesis of the left ventricular, mid-apical anteroseptal wall, anterior wall, anterolateral wall, inferior wall and apical segment.  2. Right ventricular systolic function is normal. The right ventricular size is normal.  3. Right atrial size was mildly dilated.  4. The mitral valve is normal in structure. Mild mitral valve regurgitation. No evidence of mitral stenosis.  5. Unable to determine valve morphology due to image quality. Aortic valve regurgitation is not visualized. No aortic stenosis is present. FINDINGS  Left Ventricle: Left ventricular ejection fraction, by estimation, is 25 to 30%. The left ventricle has severely decreased function. The left ventricle demonstrates regional wall motion abnormalities. Definity contrast agent was given IV  to delineate the left ventricular endocardial borders. The left ventricular internal cavity size was normal in size. There is no left ventricular hypertrophy. Left ventricular diastolic parameters are consistent with Grade II diastolic dysfunction (pseudonormalization). Elevated left atrial pressure. Right Ventricle: Pulmonary artery pressure is at least mildly elevated (RVSP 35-40 mmHg plus central venous pressure). The right ventricular size is normal. No increase in right ventricular wall thickness. Right ventricular systolic function is normal. Left Atrium: Left atrial size was normal in size. Right Atrium: Right atrial size was mildly dilated. Pericardium: There is no evidence of pericardial  effusion. Mitral Valve: The mitral valve is normal in structure. Mild mitral valve regurgitation. No evidence of mitral valve stenosis. MV peak gradient, 5.6 mmHg. The mean mitral valve gradient is 2.0 mmHg. Tricuspid Valve: The tricuspid valve is normal in structure. Tricuspid valve regurgitation is trivial. Aortic Valve: Unable to determine valve morphology due to image quality. Aortic valve regurgitation is not visualized. No aortic stenosis is present. Aortic valve mean gradient measures 6.0 mmHg. Aortic valve peak gradient measures 11.3 mmHg. Aortic valve area, by VTI measures 1.49 cm. Pulmonic Valve: The pulmonic valve was grossly normal. Pulmonic valve regurgitation is not visualized. No evidence of pulmonic stenosis. Aorta: The aortic root is normal in size and structure. Pulmonary Artery: The pulmonary artery is of normal size. Venous: IVC assessment for right atrial pressure unable to be performed due to mechanical ventilation. IAS/Shunts: The interatrial septum was not well visualized.  LEFT VENTRICLE PLAX 2D LVIDd:         5.11 cm  Diastology LVIDs:         2.94 cm  LV e' lateral:   9.36 cm/s LV PW:         1.02 cm  LV E/e' lateral: 10.7 LV IVS:        0.96 cm  LV e' medial:    4.68 cm/s LVOT diam:     1.90 cm   LV E/e' medial:  21.5 LV SV:         47 LV SV Index:   25 LVOT Area:     2.84 cm  LEFT ATRIUM             Index LA diam:        3.80 cm 2.05 cm/m LA Vol (A2C):   37.6 ml 20.24 ml/m LA Vol (A4C):   44.7 ml 24.06 ml/m LA Biplane Vol: 41.6 ml 22.39 ml/m  AORTIC VALVE                    PULMONIC VALVE AV Area (Vmax):    1.26 cm     PV Vmax:       0.88 m/s AV Area (Vmean):   1.23 cm     PV Vmean:      60.300 cm/s AV Area (VTI):     1.49 cm     PV VTI:        0.196 m AV Vmax:           168.00 cm/s  PV Peak grad:  3.1 mmHg AV Vmean:          110.000 cm/s PV Mean grad:  2.0 mmHg AV VTI:            0.315 m AV Peak Grad:      11.3 mmHg AV Mean Grad:      6.0 mmHg LVOT Vmax:         74.60 cm/s LVOT Vmean:        47.700 cm/s LVOT VTI:          0.166 m LVOT/AV VTI ratio: 0.53  AORTA Ao Root diam: 2.40 cm MITRAL VALVE                TRICUSPID VALVE MV Area (PHT): 3.34 cm     TR Peak grad:   37.2 mmHg MV Peak grad:  5.6 mmHg     TR Vmax:        305.00 cm/s MV Mean grad:  2.0 mmHg MV Vmax:  1.18 m/s     SHUNTS MV Vmean:      55.3 cm/s    Systemic VTI:  0.17 m MV Decel Time: 227 msec     Systemic Diam: 1.90 cm MV E velocity: 100.50 cm/s MV A velocity: 46.80 cm/s MV E/A ratio:  2.15 Christopher End MD Electronically signed by Nelva Bush MD Signature Date/Time: 09/08/2019/6:41:08 PM    Final    CBC    Component Value Date/Time   WBC 12.9 (H) 2019/09/18 0418   RBC 3.22 (L) 09/18/19 0418   HGB 9.7 (L) 18-Sep-2019 0418   HCT 30.3 (L) 09/18/19 0418   PLT 337 18-Sep-2019 0418   MCV 94.1 18-Sep-2019 0418   MCH 30.1 Sep 18, 2019 0418   MCHC 32.0 09-18-2019 0418   RDW 14.6 Sep 18, 2019 0418   LYMPHSABS 3.0 09-18-19 0418   MONOABS 0.7 September 18, 2019 0418   EOSABS 0.1 2019/09/18 0418   BASOSABS 0.0 September 18, 2019 0418   BMP Latest Ref Rng & Units 18-Sep-2019 09/08/2019 09/08/2019  Glucose 70 - 99 mg/dL 136(H) 130(H) 215(H)  BUN 8 - 23 mg/dL 32(H) 40(H) 44(H)  Creatinine 0.61 - 1.24 mg/dL 1.69(H) 1.92(H) 1.99(H)  Sodium  135 - 145 mmol/L 140 138 139  Potassium 3.5 - 5.1 mmol/L 3.4(L) 3.8 3.9  Chloride 98 - 111 mmol/L 114(H) 109 107  CO2 22 - 32 mmol/L 18(L) 16(L) 18(L)  Calcium 8.9 - 10.3 mg/dL 7.9(L) 7.7(L) 8.2(L)     Nutrition Status: Nutrition Problem: Inadequate oral intake Etiology: inability to eat Signs/Symptoms: NPO status Interventions: Refer to RD note for recommendations     Indwelling Urinary Catheter continued, requirement due to   Reason to continue Indwelling Urinary Catheter strict Intake/Output monitoring for hemodynamic instability   Central Line/ continued, requirement due to  Reason to continue [place] of central venous pressure or other hemodynamic parameters and poor IV access   Ventilator continued, requirement due to severe respiratory failure   Ventilator Sedation RASS 0 to -2      ASSESSMENT AND PLAN SYNOPSIS   Severe ACUTE Hypoxic and Hypercapnic Respiratory Failure due to ischemic cardiomyopathy s/p cardiac arrest with acute severe resp failure -continue Full MV support -continue Bronchodilator Therapy -Wean Fio2 and PEEP as tolerated -VAP/VENT bundle implementation  ACUTE SYSTOLIC CARDIAC FAILURE-  -oxygen as needed -Lasix as tolerated  ACUTE KIDNEY INJURY/Renal Failure -continue Foley Catheter-assess need -Avoid nephrotoxic agents -Follow urine output, BMP -Ensure adequate renal perfusion, optimize oxygenation -Renal dose medications     NEUROLOGY Acute toxic metabolic encephalopathy +signs of brain damage Very grave prognosis CDS donor services to be conatcted  SHOCK-CARDIOGENIC -use vasopressors to keep MAP>65  CARDIAC ICU monitoring   GI GI PROPHYLAXIS as indicated    DIET-->TF's as tolerated Constipation protocol as indicated  ENDO - will use ICU hypoglycemic\Hyperglycemia protocol if indicated     ELECTROLYTES -follow labs as needed -replace as needed -pharmacy consultation and following   DVT/GI PRX  ordered and assessed TRANSFUSIONS AS NEEDED MONITOR FSBS I Assessed the need for Labs I Assessed the need for Foley I Assessed the need for Central Venous Line Family Discussion when available I Assessed the need for Mobilization I made an Assessment of medications to be adjusted accordingly Safety Risk assessment completed   CASE DISCUSSED IN MULTIDISCIPLINARY ROUNDS WITH ICU TEAM  Critical Care Time devoted to patient care services described in this note is 34 minutes.   Overall, patient is critically ill, prognosis is guarded.  Patient with Multiorgan failure and at high  risk for cardiac arrest and death.    Taylinn Brabant David Thai Hemrick, M.D.  [facility]  Medical Director ICU-[facility] [place] Medical Director [facility] Cardio-Pulmonary Department      

## 2019-10-02 NOTE — Progress Notes (Signed)
Pt. Extubated to room air. 

## 2019-10-02 NOTE — Death Summary Note (Signed)
DEATH SUMMARY   Patient Details  Name: Michael Ramsey MRN: [id number] DOB: 06/01/1955  Admission/Discharge Information   Admit Date:  September 28, 2019  Date of Death: Date of Death: 09/30/2019  Time of Death: Time of Death: 04-14-39  Length of Stay: 1  Referring Physician: Marguerita Merles, MD   Reason(s) for Hospitalization  CARDIAC ARREST  Diagnoses  Preliminary cause of death: ISCHEMIC CARDIOMYPATHY Secondary Diagnoses (including complications and co-morbidities):  Active Problems:   Cardiac arrest ([place])   ST elevation myocardial infarction (STEMI) [facility])   Brief Hospital Course (including significant findings, care, treatment, and services provided and events leading to death)   64 y.o. Male admitted with Cardiac arrest in setting of STEMI. Placed on Targeted Temperature Management at 36 C.  Noted to have severe myoclonus.  SIGNIFICANT EVENTS  09-28-22: Presents to ED s/p cardiac arrest 9/7: PCCM asked to admit; start TTM; Cardiology, Neurology, and Nephrology consulted  STUDIES:  9/7: CT Head>>Atrophy, chronic microvascular disease. No acute intracranial abnormality. 9/7: CXR>>Endotracheal tube is 1.6 cm above the carina. NG tube is in the stomach. Heart is upper limits normal in size. Diffuse bilateral airspace disease. No effusions. No acute bony abnormality  CULTURES: SARS-CoV-2 PCR 9/7>>negative Respiratory Viral Panel 9/7>> Blood cultures x2 9/7>> Tracheal Aspirate 9/7>> Strep pneumo urinary antigen 9/7>> Legionella urinary antigen 9/7>> Urine 9/7>>  ANTIBIOTICS: Cefepime 9/7>> Vancomycin 9/7>>  HISTORY OF PRESENT ILLNESS:   Michael Ramsey is a 64 y.o. Male with a past medical history signigticant for CKD, gout, and HTN who presents to [facility] ED on 2019/09/28 status post out of hospital cardiac arrest.  Patient was at home alone of which he contacted EMS due to respiratory distress.  Upon EMS arrival he was unconscious with agonal breathing and a thready pulse.  He then lost  pulses and was found to be in asystole.  He underwent 1 round of CPR and 1 Epi in the field with ROSC obtained (estimated time of resuscitation is 5 minutes).  He was intubated in the field with no paralytics or sedation.  He was noted to become hypotensive, of which he was placed on Dopamine.    Upon arrival to ED he is noted to be posturing, have no cough or gag reflexes, with fixed and dilated pupils, and with a GCS of 3.  Initial workup in the ED revealed  Glucose 278, BUN 42, Creatinine 2.32, Anion gap 14, lactic acid 4.3, WBC 14.1, and high sensitivity troponin 11,411. Post intubation ABG with pH 7.13/pCO2 54/pO2 431/ Bicarb 18.  EKG with ST elevation in leads V2-V4, and inversions in lateral leads.  CXR with diffuse bilateral airspace disease (concerning for infection).  His COVID-19 PCR is pending.  CT Head is negative for any acute intracranial abnormality.  ED Provider discussed case with Dr. Fletcher Anon of Cardiology who recommends medical management with Heparin drip and targeted temperature management, with reassessment in the morning for possible cardiac catheterization.  PCCM is asked to admit the patient to ICU for further workup and treatment of Cardiac arrest in the setting of STEMI.  Targeted Temperature Management (36 C) is being initiated. Clinical exam is concerning for possible anoxic brain injury.  Cardiology, Neurology, and Nephrology are consulted.  After admission to ICU pt noted to have severe Myoclonus, started on IV Keppra.    Severe ACUTE Hypoxic and Hypercapnic Respiratory Failure due to ischemic cardiomyopathy s/p cardiac arrest with acute severe resp failure -continue Full MV support -continue Bronchodilator Therapy -Wean Fio2 and PEEP as tolerated -  VAP/VENT bundle implementation  ACUTE SYSTOLIC CARDIAC FAILURE-  -oxygen as needed -Lasix as tolerated  ACUTE KIDNEY INJURY/Renal Failure -continue Foley Catheter-assess need -Avoid nephrotoxic agents -Follow urine  output, BMP -Ensure adequate renal perfusion, optimize oxygenation -Renal dose medications    NEUROLOGY Acute toxic metabolic encephalopathy +signs of brain damage Very grave prognosis CDS donor services to be conatcted  SHOCK-CARDIOGENIC -use vasopressors to keep MAP>65  CARDIAC ICU monitoring   GI GI PROPHYLAXIS as indicated    DIET-->TF's as tolerated Constipation protocol as indicated  ENDO - will use ICU hypoglycemic\Hyperglycemia protocol if indicated    ELECTROLYTES -follow labs as needed -replace as needed -pharmacy consultation and following  GOALS OF CARE DISCUSSION  The Clinical status was relayed to family in detail.  Updated and notified of patients medical condition.  Patient remains unresponsive and will not open eyes to command.    patient with increased WOB and using accessory muscles to breathe Explained to family course of therapy and the modalities     Patient with Progressive multiorgan failure with very low chance of meaningful recovery despite all aggressive and optimal medical therapy. Patient is in the Dying  Process associated with Suffering.  Family understands the situation.  They have consented and agreed to DNR/DNI and would like to proceed with Comfort care measures.  Family are satisfied with Plan of action and management. All questions answered      Pertinent Labs and Studies  Significant Diagnostic Studies DG Abdomen 1 View  Result Date: 09/08/2019 CLINICAL DATA:  Orogastric tube placement EXAM: ABDOMEN - 1 VIEW COMPARISON:  None. FINDINGS: Tip of the orogastric tube is in the stomach. The side port is just below the gastroesophageal junction. Recommend advancing 5 cm. Large amount of stool in the left colon. IMPRESSION: Orogastric tube side port just below the gastroesophageal junction. Recommend advancing 5 cm. Electronically Signed   By: Ulyses Jarred M.D.   On: 09/08/2019 00:36   CT Head Wo  Contrast  Result Date: 09/08/2019 CLINICAL DATA:  Mental status changes EXAM: CT HEAD WITHOUT CONTRAST TECHNIQUE: Contiguous axial images were obtained from the base of the skull through the vertex without intravenous contrast. COMPARISON:  10/29/2007 FINDINGS: Brain: There is atrophy and chronic small vessel disease changes. No acute intracranial abnormality. Specifically, no hemorrhage, hydrocephalus, mass lesion, acute infarction, or significant intracranial injury. Vascular: No hyperdense vessel or unexpected calcification. Skull: No acute calvarial abnormality. Sinuses/Orbits: Visualized paranasal sinuses and mastoids clear. Orbital soft tissues unremarkable. Other: None IMPRESSION: Atrophy, chronic microvascular disease. No acute intracranial abnormality. Electronically Signed   By: Rolm Baptise M.D.   On: 09/08/2019 00:27   DG Chest Portable 1 View  Result Date: 09/08/2019 CLINICAL DATA:  Intubated EXAM: PORTABLE CHEST 1 VIEW COMPARISON:  05/15/2019 FINDINGS: Endotracheal tube is 1.6 cm above the carina. NG tube is in the stomach. Heart is upper limits normal in size. Diffuse bilateral airspace disease. No effusions. No acute bony abnormality. IMPRESSION: Support devices as above. Diffuse bilateral airspace disease, likely infection. Electronically Signed   By: Rolm Baptise M.D.   On: 09/08/2019 00:36   EEG adult  Result Date: 09/08/2019 Lora Havens, MD     09/08/2019  4:02 PM Patient Name: Shaw Dobek MRN: [id number] Epilepsy Attending: Lora Havens Referring Physician/Provider: Darel Hong, NP Date: 09/08/2019 Duration: 24.46 minutes Patient history: 64 year old male status post cardiac arrest and noted to have seizure-like episodes.  EEG to evaluate for seizures. Level of alertness: Comatose AEDs during EEG study:  LEV, Propofol, Versed Technical aspects: This EEG study was done with scalp electrodes positioned according to the 10-20 International system of electrode placement. Electrical  activity was acquired at a sampling rate of 500Hz  and reviewed with a high frequency filter of 70Hz  and a low frequency filter of 1Hz . EEG data were recorded continuously and digitally stored. Description: Patient was noted to have multiple episodes of generalized axial twitching.  Concomitant EEG showed generalized high amplitude polyspikes consistent with myoclonic seizures.  EEG also showed burst suppression pattern with EEG suppression lasting 3 to 5 seconds as well as generalized highly epileptiform bursts lasting 2 to 3 seconds. Hyperventilation and photic stimulation were not performed.   ABNORMALITY -Myoclonic seizures, generalized -Burst suppression with highly epileptiform bursts, generalized IMPRESSION: This study showed multiple myoclonic seizures as well as profound diffuse encephalopathy suggestive of diffuse anoxic/hypoxic brain injury. Dr. Kerney Elbe was notified. Lora Havens   ECHOCARDIOGRAM COMPLETE  Result Date: 09/08/2019    ECHOCARDIOGRAM REPORT   Patient Name:   CLARICE BONAVENTURE Date of Exam: 09/08/2019 Medical Rec #:  [id number]    Height:       68.0 in Accession #:    [id number]   Weight:       159.8 lb Date of Birth:  01-11-1955    BSA:          1.858 m Patient Age:    20 years     BP:           97/58 mmHg Patient Gender: M            HR:           58 bpm. Exam Location:  [facility] Procedure: 2D Echo, Color Doppler, Cardiac Doppler and Intracardiac            Opacification Agent Indications:     I46.9 Cardiac arrest  History:         Patient has no prior history of Echocardiogram examinations.                  CKD; Risk Factors:Hypertension.  Sonographer:     Charmayne Sheer RDCS (AE) Referring Phys:  [id number] Bradly Bienenstock Diagnosing Phys: Nelva Bush MD  Sonographer Comments: Echo performed with patient supine and on artificial respirator. IMPRESSIONS  1. Left ventricular ejection fraction, by estimation, is 25 to 30%. The left ventricle has severely decreased function. The left ventricle  demonstrates regional wall motion abnormalities (see scoring diagram/findings for description). Left ventricular diastolic parameters are consistent with Grade II diastolic dysfunction (pseudonormalization). Elevated left atrial pressure. There is akinesis of the left ventricular, mid-apical anteroseptal wall, anterior wall, anterolateral wall, inferior wall and apical segment.  2. Right ventricular systolic function is normal. The right ventricular size is normal.  3. Right atrial size was mildly dilated.  4. The mitral valve is normal in structure. Mild mitral valve regurgitation. No evidence of mitral stenosis.  5. Unable to determine valve morphology due to image quality. Aortic valve regurgitation is not visualized. No aortic stenosis is present. FINDINGS  Left Ventricle: Left ventricular ejection fraction, by estimation, is 25 to 30%. The left ventricle has severely decreased function. The left ventricle demonstrates regional wall motion abnormalities. Definity contrast agent was given IV to delineate the left ventricular endocardial borders. The left ventricular internal cavity size was normal in size. There is no left ventricular hypertrophy. Left ventricular diastolic parameters are consistent with Grade II diastolic dysfunction (pseudonormalization). Elevated left atrial pressure. Right Ventricle: Pulmonary  artery pressure is at least mildly elevated (RVSP 35-40 mmHg plus central venous pressure). The right ventricular size is normal. No increase in right ventricular wall thickness. Right ventricular systolic function is normal. Left Atrium: Left atrial size was normal in size. Right Atrium: Right atrial size was mildly dilated. Pericardium: There is no evidence of pericardial effusion. Mitral Valve: The mitral valve is normal in structure. Mild mitral valve regurgitation. No evidence of mitral valve stenosis. MV peak gradient, 5.6 mmHg. The mean mitral valve gradient is 2.0 mmHg. Tricuspid Valve: The  tricuspid valve is normal in structure. Tricuspid valve regurgitation is trivial. Aortic Valve: Unable to determine valve morphology due to image quality. Aortic valve regurgitation is not visualized. No aortic stenosis is present. Aortic valve mean gradient measures 6.0 mmHg. Aortic valve peak gradient measures 11.3 mmHg. Aortic valve area, by VTI measures 1.49 cm. Pulmonic Valve: The pulmonic valve was grossly normal. Pulmonic valve regurgitation is not visualized. No evidence of pulmonic stenosis. Aorta: The aortic root is normal in size and structure. Pulmonary Artery: The pulmonary artery is of normal size. Venous: IVC assessment for right atrial pressure unable to be performed due to mechanical ventilation. IAS/Shunts: The interatrial septum was not well visualized.  LEFT VENTRICLE PLAX 2D LVIDd:         5.11 cm  Diastology LVIDs:         2.94 cm  LV e' lateral:   9.36 cm/s LV PW:         1.02 cm  LV E/e' lateral: 10.7 LV IVS:        0.96 cm  LV e' medial:    4.68 cm/s LVOT diam:     1.90 cm  LV E/e' medial:  21.5 LV SV:         47 LV SV Index:   25 LVOT Area:     2.84 cm  LEFT ATRIUM             Index LA diam:        3.80 cm 2.05 cm/m LA Vol (A2C):   37.6 ml 20.24 ml/m LA Vol (A4C):   44.7 ml 24.06 ml/m LA Biplane Vol: 41.6 ml 22.39 ml/m  AORTIC VALVE                    PULMONIC VALVE AV Area (Vmax):    1.26 cm     PV Vmax:       0.88 m/s AV Area (Vmean):   1.23 cm     PV Vmean:      60.300 cm/s AV Area (VTI):     1.49 cm     PV VTI:        0.196 m AV Vmax:           168.00 cm/s  PV Peak grad:  3.1 mmHg AV Vmean:          110.000 cm/s PV Mean grad:  2.0 mmHg AV VTI:            0.315 m AV Peak Grad:      11.3 mmHg AV Mean Grad:      6.0 mmHg LVOT Vmax:         74.60 cm/s LVOT Vmean:        47.700 cm/s LVOT VTI:          0.166 m LVOT/AV VTI ratio: 0.53  AORTA Ao Root diam: 2.40 cm MITRAL VALVE  TRICUSPID VALVE MV Area (PHT): 3.34 cm     TR Peak grad:   37.2 mmHg MV Peak grad:  5.6 mmHg      TR Vmax:        305.00 cm/s MV Mean grad:  2.0 mmHg MV Vmax:       1.18 m/s     SHUNTS MV Vmean:      55.3 cm/s    Systemic VTI:  0.17 m MV Decel Time: 227 msec     Systemic Diam: 1.90 cm MV E velocity: 100.50 cm/s MV A velocity: 46.80 cm/s MV E/A ratio:  2.15 Harrell Gave End MD Electronically signed by Nelva Bush MD Signature Date/Time: 09/08/2019/6:41:08 PM    Final     Microbiology Recent Results (from the past 240 hour(s))  SARS Coronavirus 2 by RT PCR (hospital order, performed in [place] hospital lab) Nasopharyngeal Nasopharyngeal Swab     Status: None   Collection Time: 09/28/2019 11:47 PM   Specimen: Nasopharyngeal Swab  Result Value Ref Range Status   SARS Coronavirus 2 NEGATIVE NEGATIVE Final    Comment: (NOTE) SARS-CoV-2 target nucleic acids are NOT DETECTED.  The SARS-CoV-2 RNA is generally detectable in upper and lower respiratory specimens during the acute phase of infection. The lowest concentration of SARS-CoV-2 viral copies this assay can detect is 250 copies / mL. A negative result does not preclude SARS-CoV-2 infection and should not be used as the sole basis for treatment or other patient management decisions.  A negative result may occur with improper specimen collection / handling, submission of specimen other than nasopharyngeal swab, presence of viral mutation(s) within the areas targeted by this assay, and inadequate number of viral copies (<250 copies / mL). A negative result must be combined with clinical observations, patient history, and epidemiological information.  Fact Sheet for Patients:   StrictlyIdeas.no  Fact Sheet for Healthcare Providers: BankingDealers.co.za  This test is not yet approved or  cleared by the [place] FDA and has been authorized for detection and/or diagnosis of SARS-CoV-2 by FDA under an Emergency Use Authorization (EUA).  This EUA will remain in effect (meaning this test  can be used) for the duration of the COVID-19 declaration under Section 564(b)(1) of the Act, 21 U.S.C. section 360bbb-3(b)(1), unless the authorization is terminated or revoked sooner.  Performed at [facility], [address] [place], [place] [address]   Urine Culture     Status: Abnormal (Preliminary result)   Collection Time: 09/18/2019 11:47 PM   Specimen: Urine, Random  Result Value Ref Range Status   Specimen Description   Final    URINE, RANDOM Performed at [facility], [address] [place], [place] [address]    Special Requests   Final    NONE Performed at [facility], [address] [place], [place] [address]    Culture (A)  Final    70,000 COLONIES/mL ESCHERICHIA COLI SUSCEPTIBILITIES TO FOLLOW Performed at [facility], [place] [address] [place], [place] [address]    Report Status PENDING  Incomplete  CULTURE, BLOOD (ROUTINE X 2) w Reflex to ID Panel     Status: None (Preliminary result)   Collection Time: 09/08/19  1:14 AM   Specimen: BLOOD  Result Value Ref Range Status   Specimen Description BLOOD LT FOREARM  Final   Special Requests   Final    BOTTLES DRAWN AEROBIC AND ANAEROBIC Blood Culture adequate volume   Culture   Final  NO GROWTH 1 DAY Performed at [facility], [address] [place], [place] [address]    Report Status PENDING  Incomplete  CULTURE, BLOOD (ROUTINE X 2) w Reflex to ID Panel     Status: None (Preliminary result)   Collection Time: 09/08/19  1:14 AM   Specimen: BLOOD  Result Value Ref Range Status   Specimen Description BLOOD RAC  Final   Special Requests   Final    BOTTLES DRAWN AEROBIC AND ANAEROBIC Blood Culture adequate volume   Culture   Final    NO GROWTH 1 DAY Performed at [facility], [address] [place], [place] [address]    Report Status PENDING  Incomplete  MRSA PCR Screening     Status: None   Collection Time: 09/08/19  2:44 AM   Specimen:  Nasopharyngeal  Result Value Ref Range Status   MRSA by PCR NEGATIVE NEGATIVE Final    Comment:        The GeneXpert MRSA Assay (FDA approved for NASAL specimens only), is one component of a comprehensive MRSA colonization surveillance program. It is not intended to diagnose MRSA infection nor to guide or monitor treatment for MRSA infections. Performed at [facility], [address] [place], [place] [address]   Respiratory Panel by PCR     Status: None   Collection Time: 09/08/19  4:00 AM   Specimen: Nasopharyngeal Swab; Respiratory  Result Value Ref Range Status   Adenovirus NOT DETECTED NOT DETECTED Final   Coronavirus 229E NOT DETECTED NOT DETECTED Final    Comment: (NOTE) The Coronavirus on the Respiratory Panel, DOES NOT test for the novel  Coronavirus (2019 nCoV)    Coronavirus HKU1 NOT DETECTED NOT DETECTED Final   Coronavirus NL63 NOT DETECTED NOT DETECTED Final   Coronavirus OC43 NOT DETECTED NOT DETECTED Final   Metapneumovirus NOT DETECTED NOT DETECTED Final   Rhinovirus / Enterovirus NOT DETECTED NOT DETECTED Final   Influenza A NOT DETECTED NOT DETECTED Final   Influenza B NOT DETECTED NOT DETECTED Final   Parainfluenza Virus 1 NOT DETECTED NOT DETECTED Final   Parainfluenza Virus 2 NOT DETECTED NOT DETECTED Final   Parainfluenza Virus 3 NOT DETECTED NOT DETECTED Final   Parainfluenza Virus 4 NOT DETECTED NOT DETECTED Final   Respiratory Syncytial Virus NOT DETECTED NOT DETECTED Final   Bordetella pertussis NOT DETECTED NOT DETECTED Final   Chlamydophila pneumoniae NOT DETECTED NOT DETECTED Final   Mycoplasma pneumoniae NOT DETECTED NOT DETECTED Final    Comment: Performed at [facility], [place]. [address] [place], [place] [address]    Lab Basic Metabolic Panel: Recent Labs  Lab 09/13/2019 2347 09/08/19 0339 09/08/19 1115 10-05-19 0418  NA 140 139 138 140  K 4.4 3.9 3.8 3.4*  CL 105 107 109 114*  CO2 21* 18* 16* 18*  GLUCOSE 278*  215* 130* 136*  BUN 42* 44* 40* 32*  CREATININE 2.32* 1.99* 1.92* 1.69*  CALCIUM 8.2* 8.2* 7.7* 7.9*  MG  --   --   --  2.1  PHOS  --   --   --  2.7   Liver Function Tests: Recent Labs  Lab 09/08/2019 2347 09/08/19 0339 10/05/2019 0418  AST 44* 40 28  ALT 35 35 26  ALKPHOS 100 87 65  BILITOT 0.5 0.8 0.7  PROT 7.1 6.9 5.6*  ALBUMIN 3.2* 3.4* 2.6*   No results for input(s): LIPASE, AMYLASE in the last 168 hours. No results for input(s):  AMMONIA in the last 168 hours. CBC: Recent Labs  Lab 09/27/2019 2347 09/08/19 0339 September 17, 2019 0418  WBC 14.1* 13.5* 12.9*  NEUTROABS 8.8* 11.8* 9.1*  HGB 11.2* 11.0* 9.7*  HCT 36.0* 35.2* 30.3*  MCV 96.0 94.4 94.1  PLT 384 364 337   Cardiac Enzymes: No results for input(s): CKTOTAL, CKMB, CKMBINDEX, TROPONINI in the last 168 hours. Sepsis Labs: Recent Labs  Lab 09/30/2019 2347 09/08/19 0339 09/08/19 1115 09/17/2019 0418  PROCALCITON  --  3.20  --  10.75  WBC 14.1* 13.5*  --  12.9*  LATICACIDVEN 4.3* 1.8 1.4  --       Kainan Patty 09-17-19, 1:22 PM

## 2019-10-02 DEATH — deceased

## 2021-02-14 IMAGING — CR DG ANKLE 2V *L*
1 series · 2 of 2 positions shown · non-contrast
Comparison: None

CLINICAL DATA: History of gout and renal failure.  Ankle pain.

EXAM:
LEFT ANKLE - 2 VIEW

[Series 1: dg ankle 2 views left · 0.14mm/px · 2 of 2 slices shown]
[im 1/2]
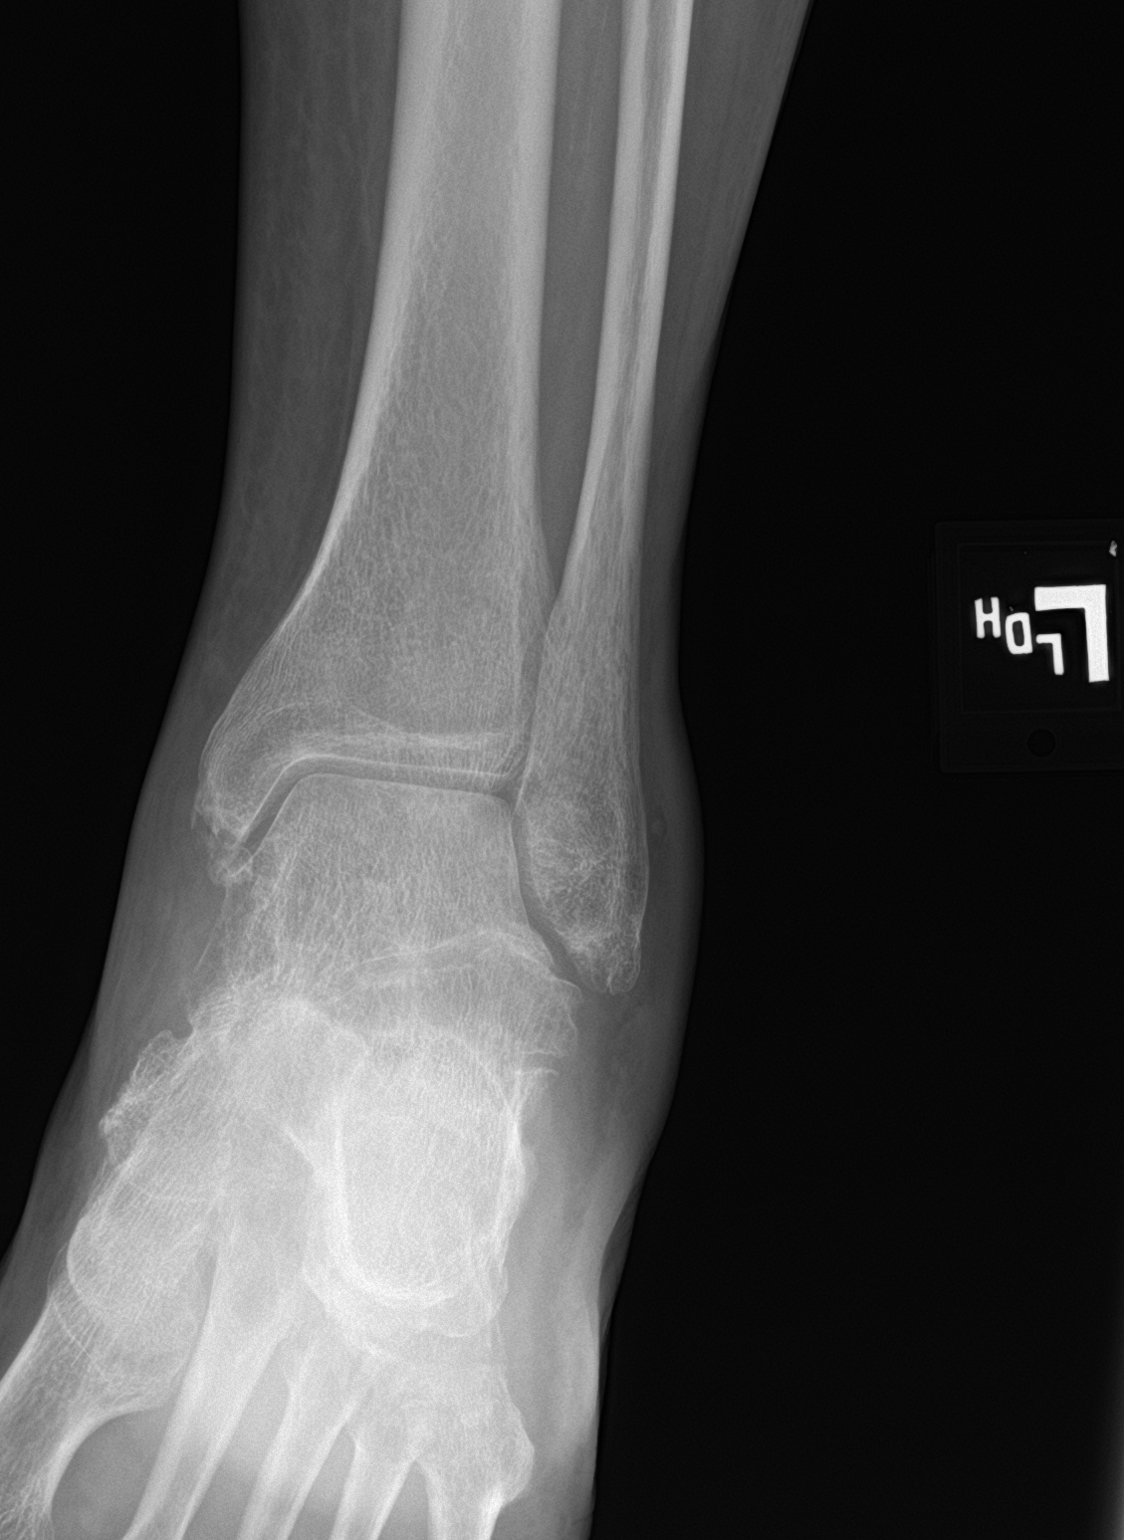
[im 2/2]
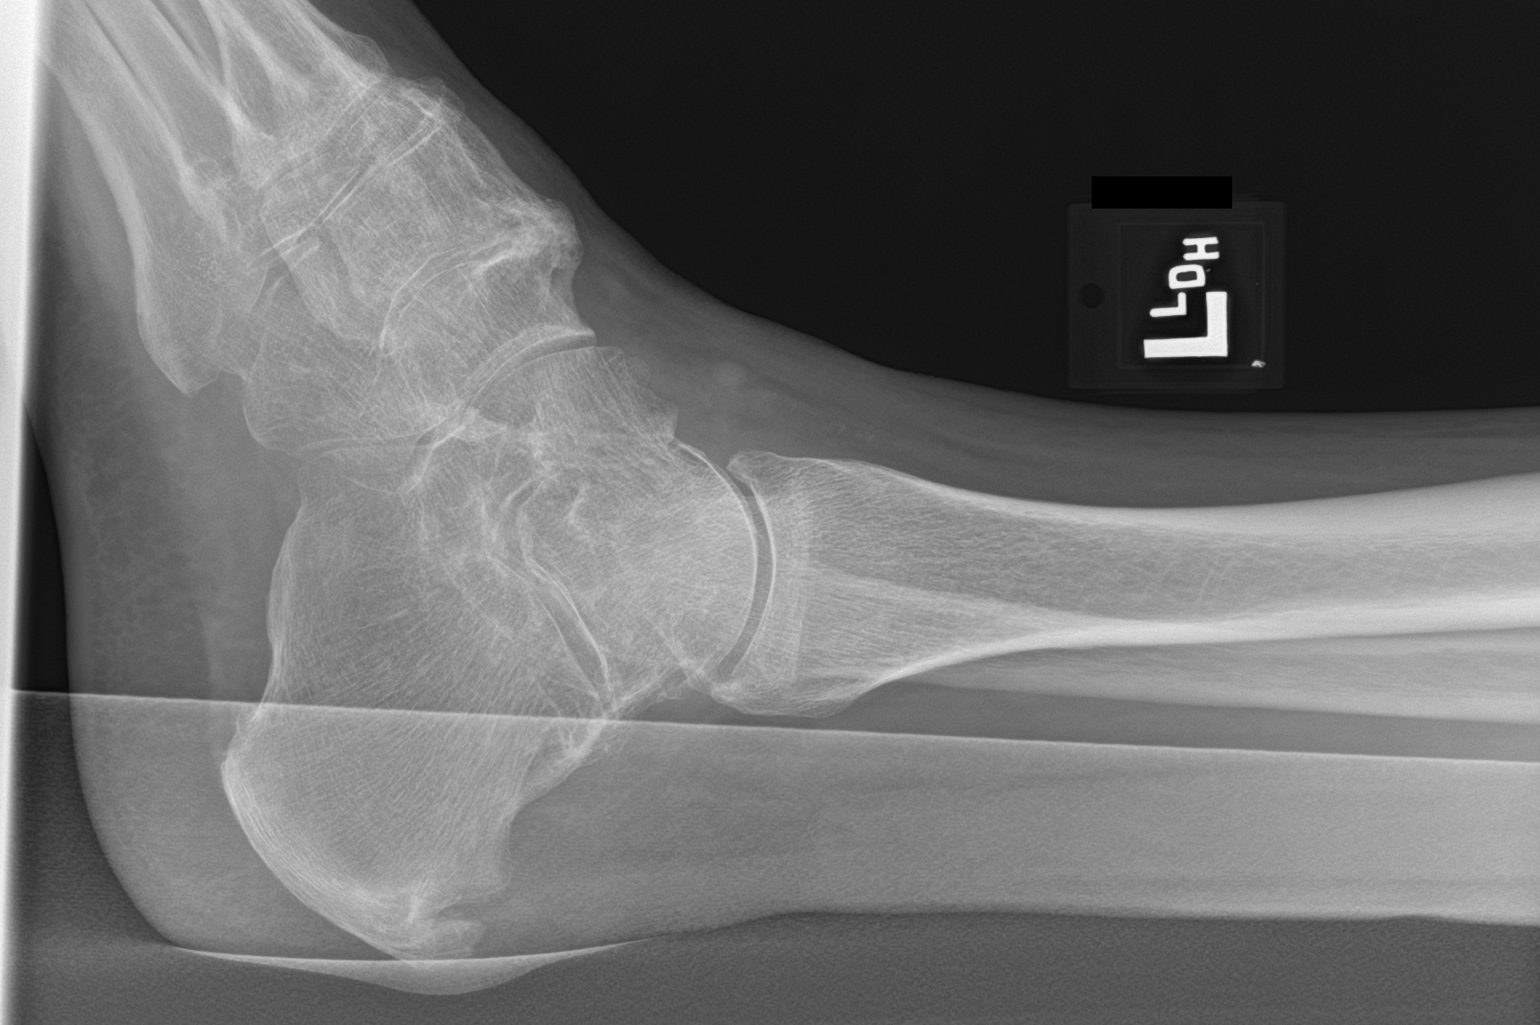

[2 of 2 positions shown; findings below may reference images not displayed]

FINDINGS: Ankle joint itself appears normal. Chronic fusion in the midfoot
region. As seen on the other side, the anterior process of the talus
is diminutive. Given the symmetric nature, this is probably
congenital. No evidence of erosions to suggest gout. No acute
fracture.
IMPRESSION: Midfoot degenerative change and fusion. See above discussion. No
sign of gout.

## 2021-02-14 IMAGING — CR DG KNEE 1-2V*R*
1 series · 2 of 2 positions shown · non-contrast
Comparison: None.

CLINICAL DATA: Gout and renal failure.  Knee pain.

EXAM:
RIGHT KNEE - 1-2 VIEW

[Series 1: dg knee 1-2 views right · 0.14mm/px · 2 of 2 slices shown]
[im 1/2]
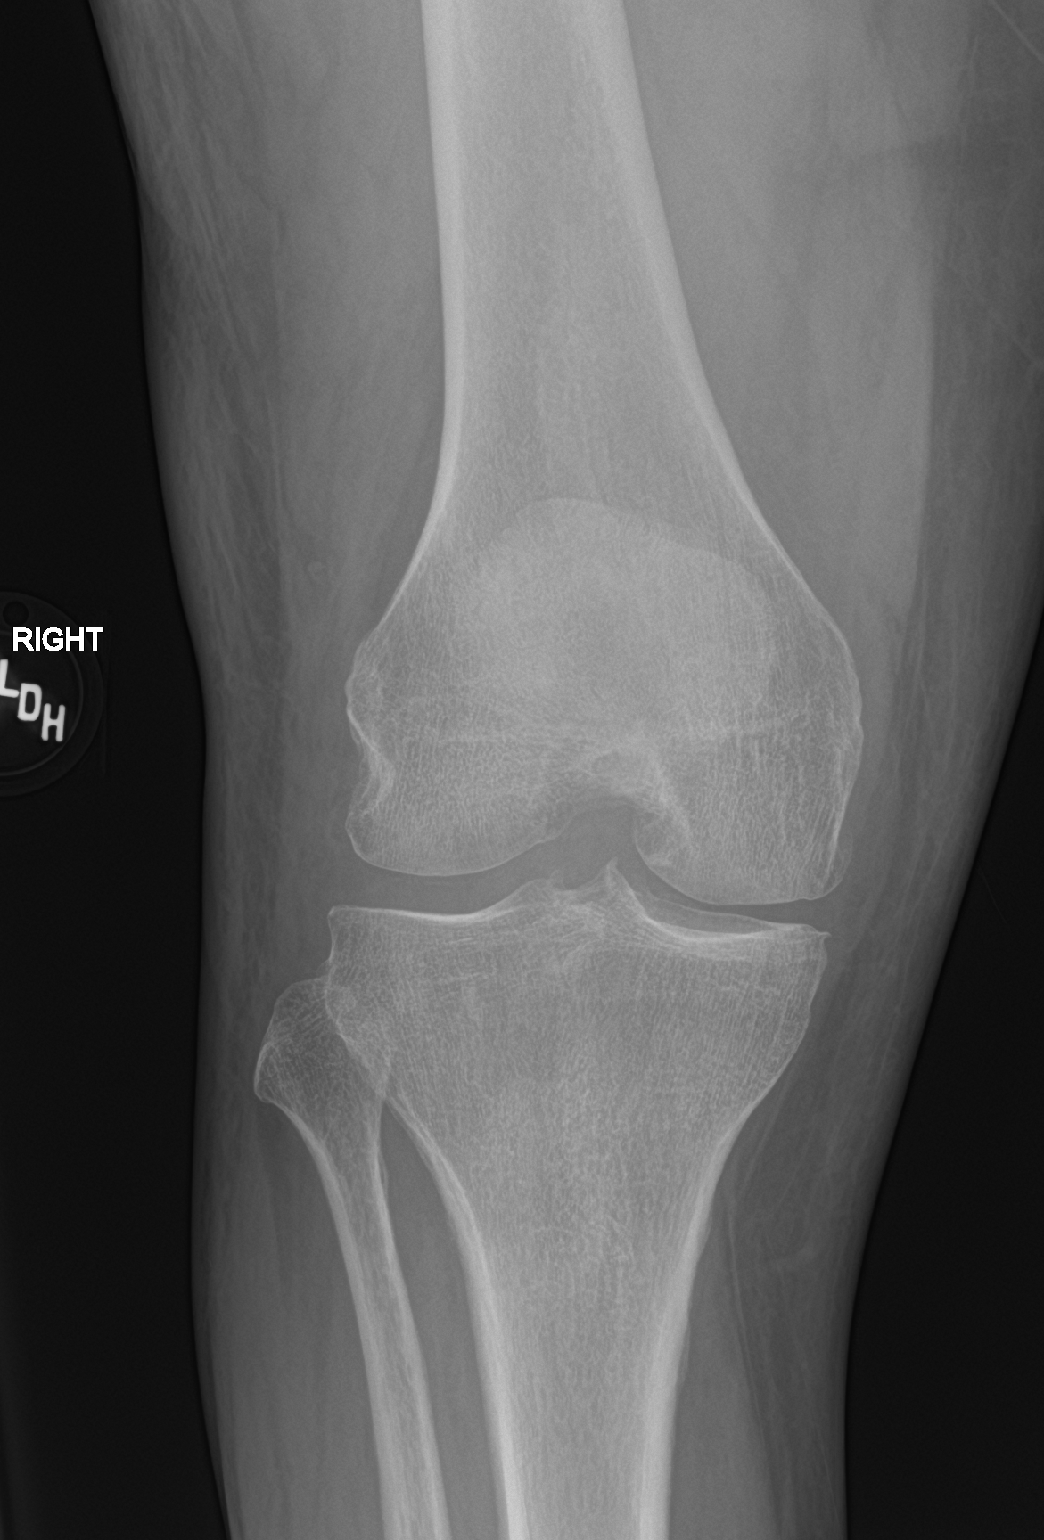
[im 2/2]
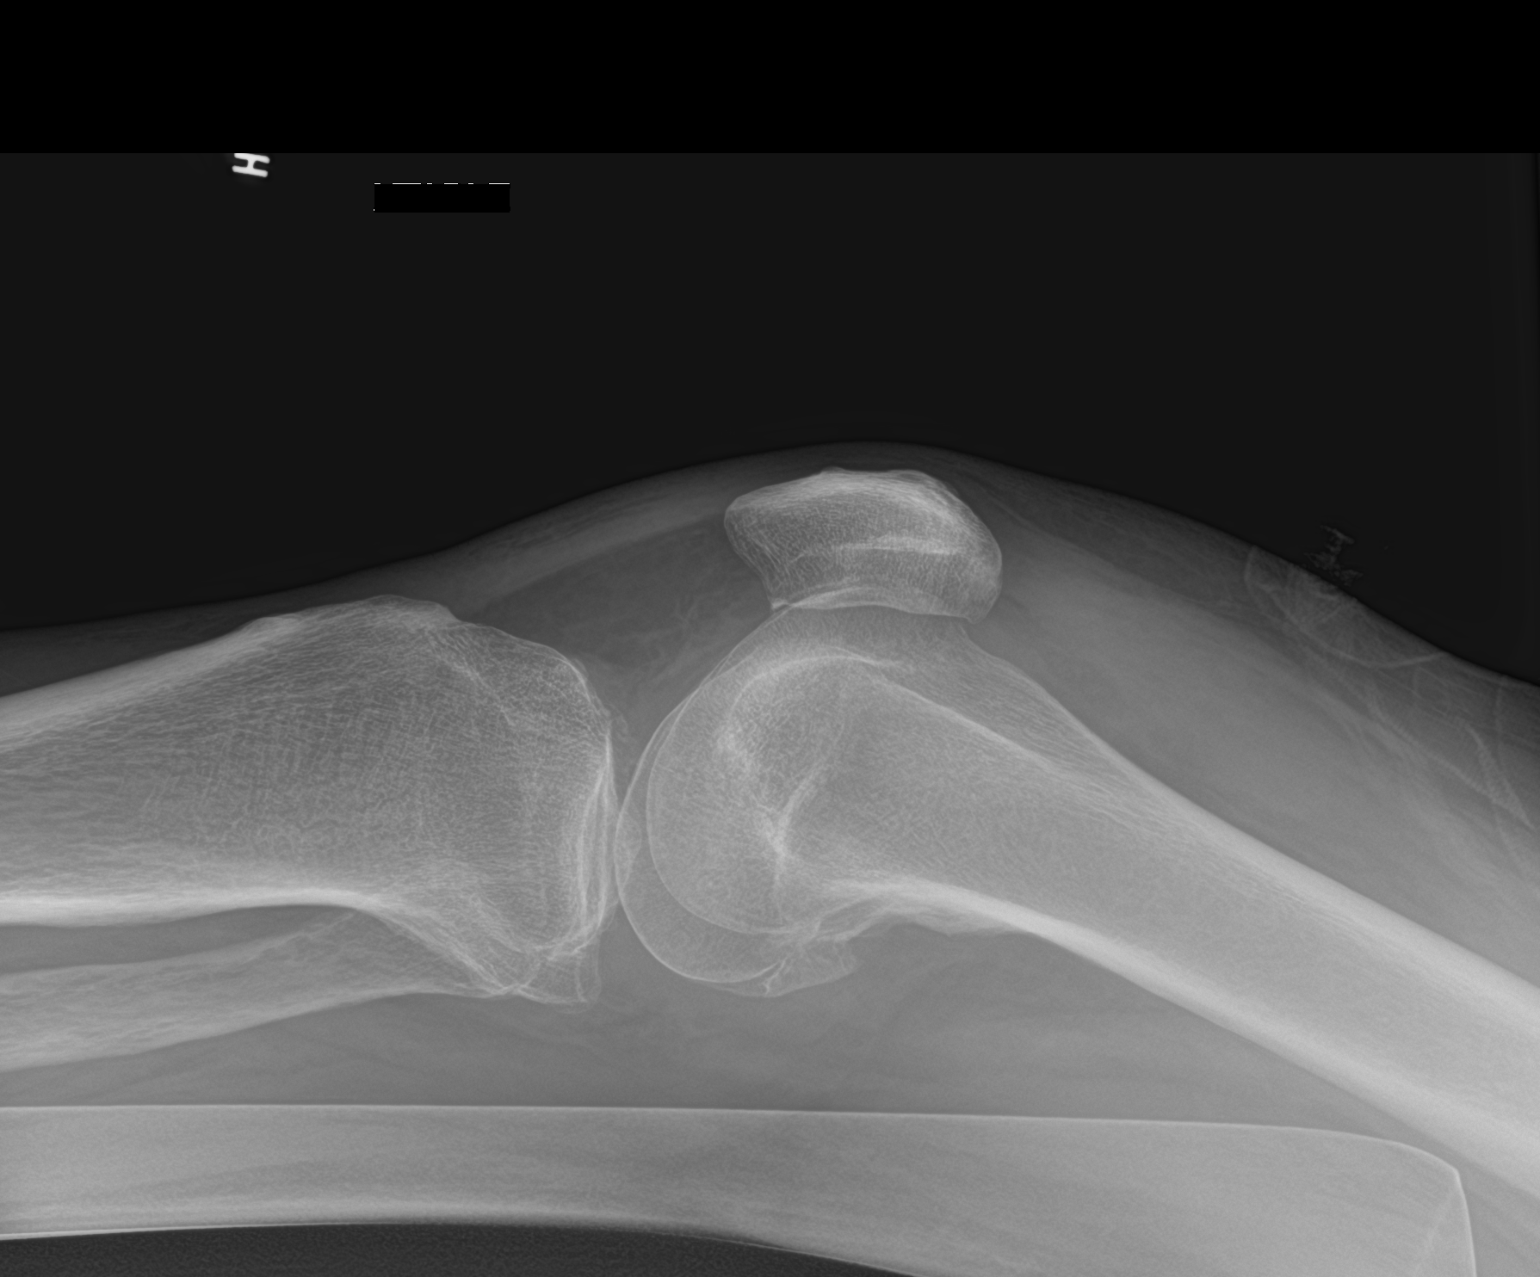

[2 of 2 positions shown; findings below may reference images not displayed]

FINDINGS: Knee joint effusion. No evidence of joint space narrowing. Small
medial compartment osteophytes. No other focal bone finding.
IMPRESSION: Joint effusion.  Minimal spurring of the medial compartment.

## 2022-06-24 IMAGING — CT CT HEAD W/O CM
3 of 6 series · 16 of 47 positions shown, 19 images · non-contrast
Comparison: 10/29/2007

CLINICAL DATA: Mental status changes

EXAM:
CT HEAD WITHOUT CONTRAST
TECHNIQUE: Contiguous axial images were obtained from the base of the skull
through the vertex without intravenous contrast.

[Series 2: head wo · axial · 0.47mm/px · z∈[-122,+18]mm · 11 of 34 slices shown, 14 images]
[im 3/34  brain]
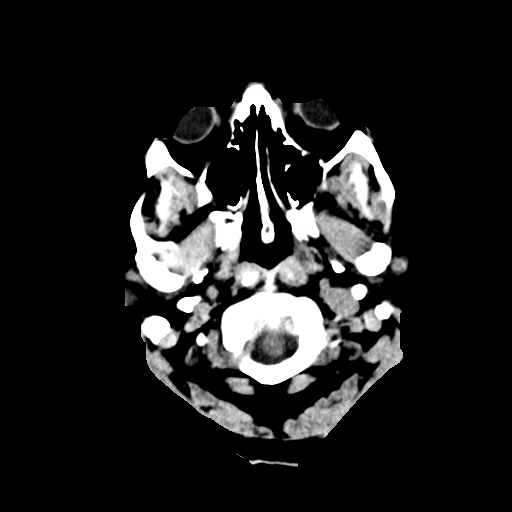
[im 3/34  bone]
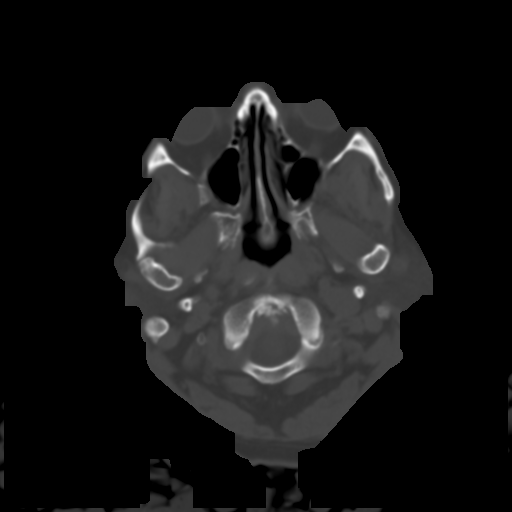
[im 5/34  brain]
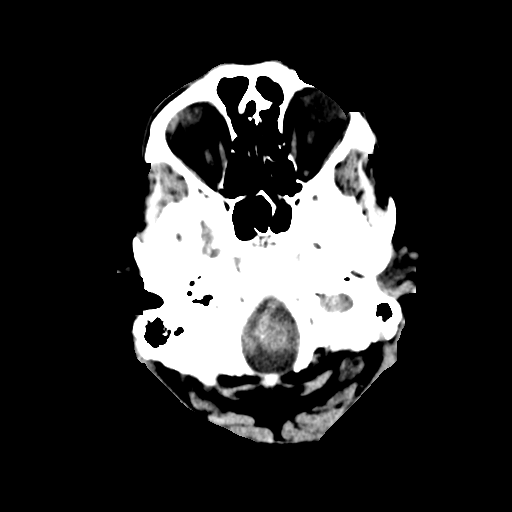
[im 8/34  brain]
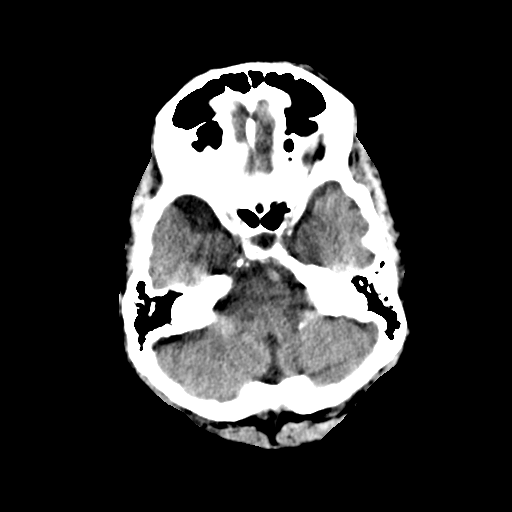
[im 12/34  brain]
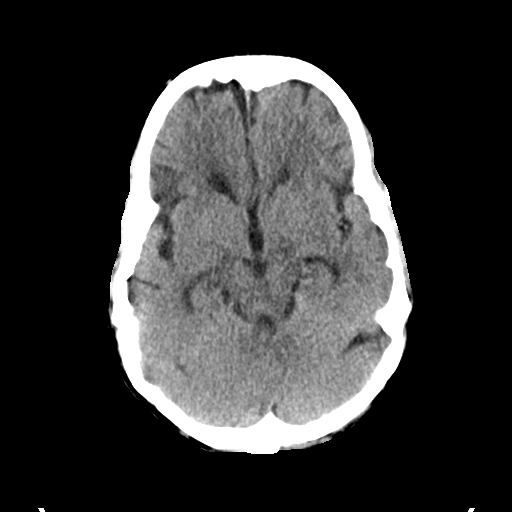
[im 15/34  brain]
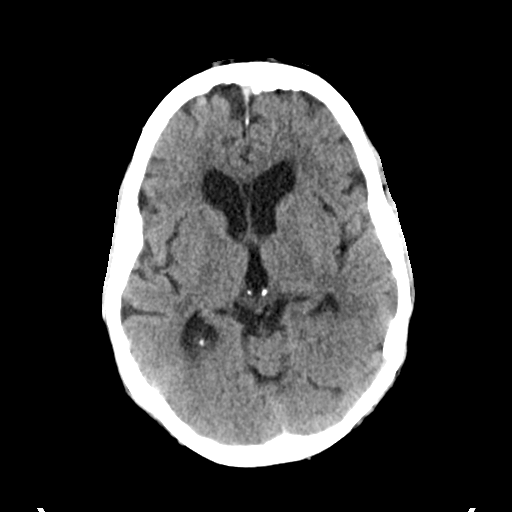
[im 15/34  bone]
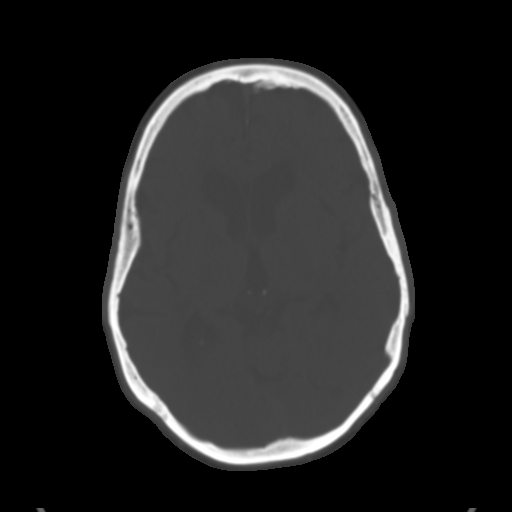
[im 17/34  brain]
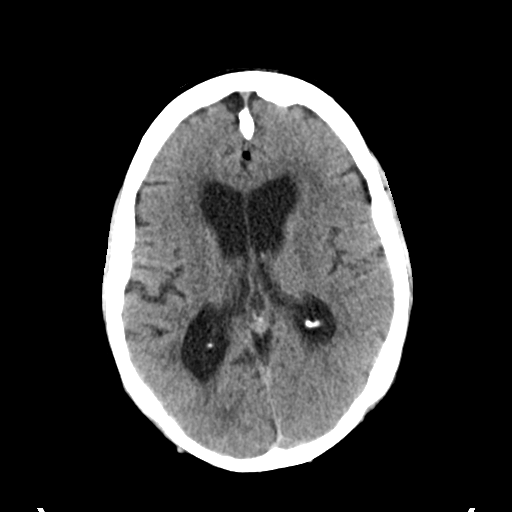
[im 19/34  brain]
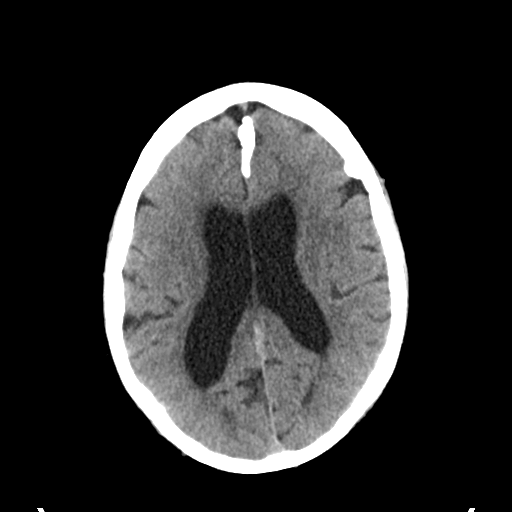
[im 22/34  brain]
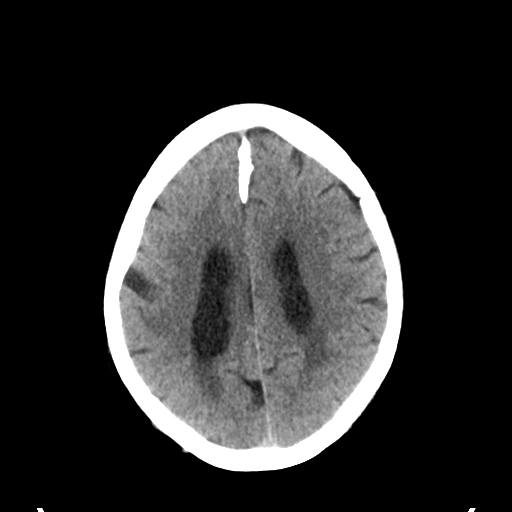
[im 26/34  brain]
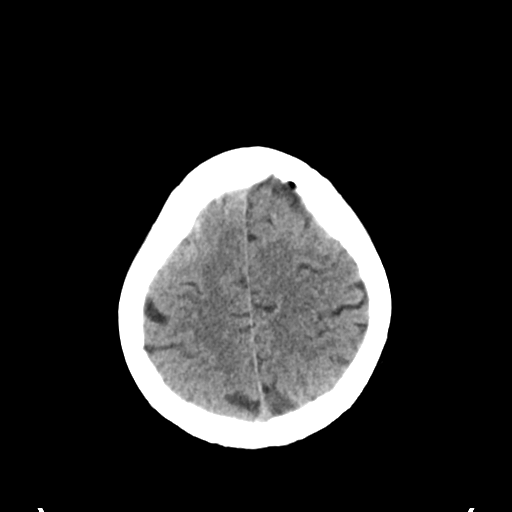
[im 26/34  bone]
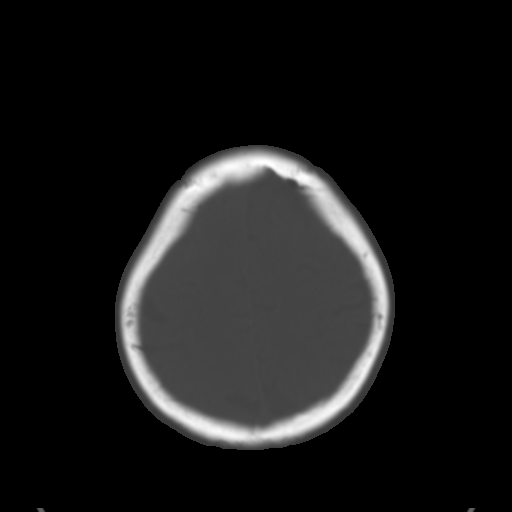
[im 29/34  brain]
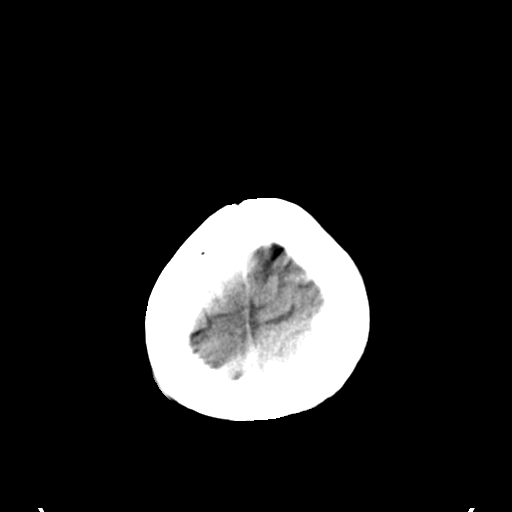
[im 31/34  brain]
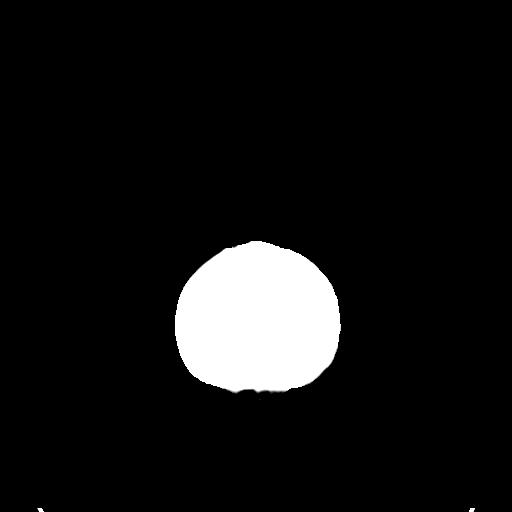

[Series 4: coronal soft tissue · coronal · 0.35mm/px · 3 of 72 slices shown]
[im 18/72  brain]
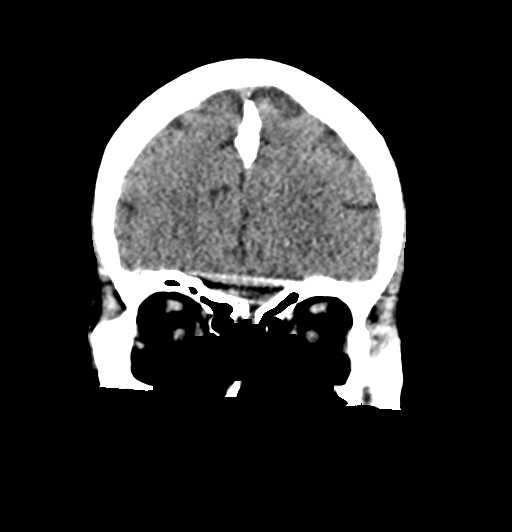
[im 36/72  brain]
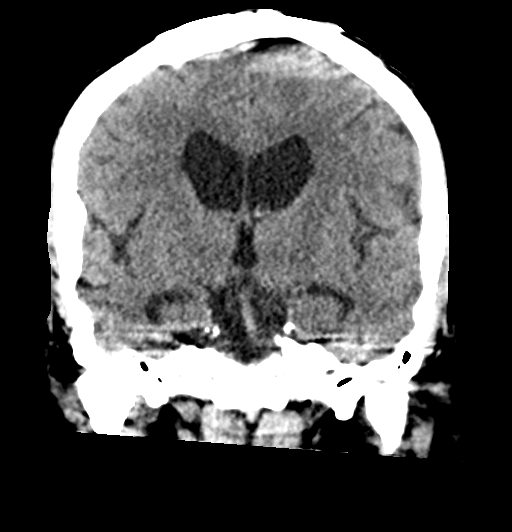
[im 54/72  brain]
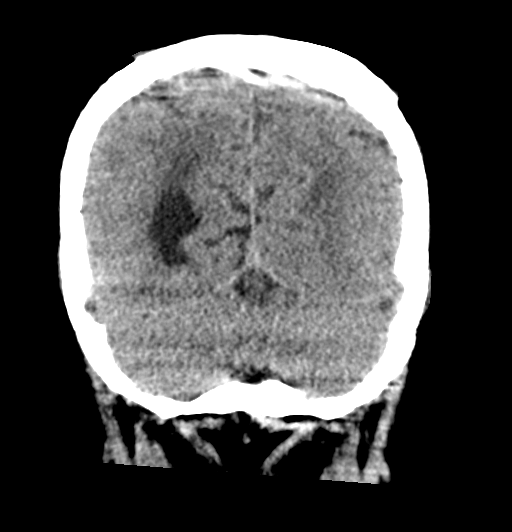

[Series 5: sagittal soft tissue · sagittal · 0.37mm/px · 2 of 61 slices shown]
[im 21/61  brain]
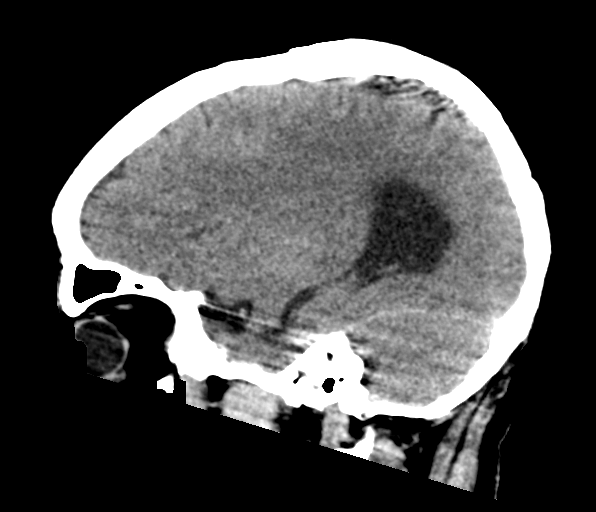
[im 41/61  brain]
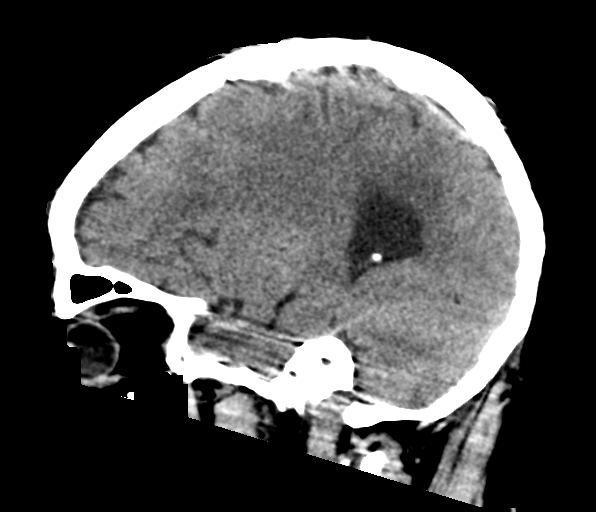

[16 of 47 positions shown; findings below may reference images not displayed]

FINDINGS: Brain: There is atrophy and chronic small vessel disease changes. No
acute intracranial abnormality. Specifically, no hemorrhage,
hydrocephalus, mass lesion, acute infarction, or significant
intracranial injury.

Vascular: No hyperdense vessel or unexpected calcification.

Skull: No acute calvarial abnormality.

Sinuses/Orbits: Visualized paranasal sinuses and mastoids clear.
Orbital soft tissues unremarkable.

Other: None
IMPRESSION: Atrophy, chronic microvascular disease.

No acute intracranial abnormality.
